# Patient Record
Sex: Male | Born: 1935 | Race: Black or African American | Hispanic: No | Marital: Married | Smoking: Former smoker
Health system: Southern US, Community
[De-identification: ages and names within clinical notes are randomized; demographics above are authoritative.]

## PROBLEM LIST (undated history)

## (undated) DIAGNOSIS — N529 Male erectile dysfunction, unspecified: Secondary | ICD-10-CM

## (undated) DIAGNOSIS — K5900 Constipation, unspecified: Secondary | ICD-10-CM

## (undated) DIAGNOSIS — E7800 Pure hypercholesterolemia, unspecified: Secondary | ICD-10-CM

## (undated) DIAGNOSIS — N400 Enlarged prostate without lower urinary tract symptoms: Secondary | ICD-10-CM

## (undated) DIAGNOSIS — I10 Essential (primary) hypertension: Secondary | ICD-10-CM

## (undated) DIAGNOSIS — K279 Peptic ulcer, site unspecified, unspecified as acute or chronic, without hemorrhage or perforation: Secondary | ICD-10-CM

## (undated) DIAGNOSIS — I4891 Unspecified atrial fibrillation: Secondary | ICD-10-CM

## (undated) DIAGNOSIS — R350 Frequency of micturition: Secondary | ICD-10-CM

## (undated) HISTORY — DX: Male erectile dysfunction, unspecified: N52.9

## (undated) HISTORY — DX: Benign prostatic hyperplasia without lower urinary tract symptoms: N40.0

## (undated) HISTORY — DX: Peptic ulcer, site unspecified, unspecified as acute or chronic, without hemorrhage or perforation: K27.9

## (undated) HISTORY — PX: ANKLE SURGERY: SHX546

## (undated) HISTORY — DX: Constipation, unspecified: K59.00

## (undated) HISTORY — DX: Frequency of micturition: R35.0

---

## 2000-11-30 ENCOUNTER — Emergency Department (HOSPITAL_COMMUNITY): Admission: EM | Admit: 2000-11-30 | Discharge: 2000-11-30 | Payer: Self-pay | Admitting: Emergency Medicine

## 2000-11-30 ENCOUNTER — Encounter: Payer: Self-pay | Admitting: Emergency Medicine

## 2001-04-15 ENCOUNTER — Emergency Department (HOSPITAL_COMMUNITY): Admission: EM | Admit: 2001-04-15 | Discharge: 2001-04-15 | Payer: Self-pay | Admitting: Emergency Medicine

## 2001-04-15 ENCOUNTER — Encounter: Payer: Self-pay | Admitting: Emergency Medicine

## 2001-04-15 ENCOUNTER — Encounter: Payer: Self-pay | Admitting: *Deleted

## 2001-06-13 ENCOUNTER — Ambulatory Visit (HOSPITAL_COMMUNITY): Admission: RE | Admit: 2001-06-13 | Discharge: 2001-06-13 | Payer: Self-pay | Admitting: Specialist

## 2001-07-25 ENCOUNTER — Ambulatory Visit (HOSPITAL_COMMUNITY): Admission: RE | Admit: 2001-07-25 | Discharge: 2001-07-25 | Payer: Self-pay | Admitting: Specialist

## 2002-03-11 ENCOUNTER — Encounter: Payer: Self-pay | Admitting: Emergency Medicine

## 2002-03-11 ENCOUNTER — Emergency Department (HOSPITAL_COMMUNITY): Admission: EM | Admit: 2002-03-11 | Discharge: 2002-03-11 | Payer: Self-pay | Admitting: Emergency Medicine

## 2003-06-29 ENCOUNTER — Emergency Department (HOSPITAL_COMMUNITY): Admission: EM | Admit: 2003-06-29 | Discharge: 2003-06-29 | Payer: Self-pay | Admitting: Emergency Medicine

## 2003-07-02 ENCOUNTER — Emergency Department (HOSPITAL_COMMUNITY): Admission: AD | Admit: 2003-07-02 | Discharge: 2003-07-02 | Payer: Self-pay | Admitting: Family Medicine

## 2003-07-19 ENCOUNTER — Emergency Department (HOSPITAL_COMMUNITY): Admission: AD | Admit: 2003-07-19 | Discharge: 2003-07-19 | Payer: Self-pay | Admitting: Emergency Medicine

## 2003-09-20 ENCOUNTER — Emergency Department (HOSPITAL_COMMUNITY): Admission: EM | Admit: 2003-09-20 | Discharge: 2003-09-20 | Payer: Self-pay | Admitting: Emergency Medicine

## 2004-04-29 ENCOUNTER — Emergency Department (HOSPITAL_COMMUNITY): Admission: EM | Admit: 2004-04-29 | Discharge: 2004-04-29 | Payer: Self-pay | Admitting: Emergency Medicine

## 2004-05-03 ENCOUNTER — Emergency Department (HOSPITAL_COMMUNITY): Admission: EM | Admit: 2004-05-03 | Discharge: 2004-05-03 | Payer: Self-pay | Admitting: Family Medicine

## 2004-07-27 ENCOUNTER — Emergency Department (HOSPITAL_COMMUNITY): Admission: EM | Admit: 2004-07-27 | Discharge: 2004-07-27 | Payer: Self-pay | Admitting: Emergency Medicine

## 2004-07-28 ENCOUNTER — Emergency Department (HOSPITAL_COMMUNITY): Admission: EM | Admit: 2004-07-28 | Discharge: 2004-07-28 | Payer: Self-pay | Admitting: Emergency Medicine

## 2004-08-13 ENCOUNTER — Emergency Department (HOSPITAL_COMMUNITY): Admission: EM | Admit: 2004-08-13 | Discharge: 2004-08-13 | Payer: Self-pay | Admitting: Emergency Medicine

## 2005-01-22 ENCOUNTER — Emergency Department (HOSPITAL_COMMUNITY): Admission: EM | Admit: 2005-01-22 | Discharge: 2005-01-22 | Payer: Self-pay | Admitting: Emergency Medicine

## 2005-11-28 ENCOUNTER — Emergency Department (HOSPITAL_COMMUNITY): Admission: EM | Admit: 2005-11-28 | Discharge: 2005-11-28 | Payer: Self-pay | Admitting: Family Medicine

## 2006-02-17 ENCOUNTER — Emergency Department (HOSPITAL_COMMUNITY): Admission: EM | Admit: 2006-02-17 | Discharge: 2006-02-17 | Payer: Self-pay | Admitting: Emergency Medicine

## 2007-02-22 ENCOUNTER — Emergency Department (HOSPITAL_COMMUNITY): Admission: EM | Admit: 2007-02-22 | Discharge: 2007-02-22 | Payer: Self-pay | Admitting: Emergency Medicine

## 2007-05-25 ENCOUNTER — Emergency Department (HOSPITAL_COMMUNITY): Admission: EM | Admit: 2007-05-25 | Discharge: 2007-05-25 | Payer: Self-pay | Admitting: Family Medicine

## 2008-11-25 ENCOUNTER — Emergency Department (HOSPITAL_COMMUNITY): Admission: EM | Admit: 2008-11-25 | Discharge: 2008-11-25 | Payer: Self-pay | Admitting: Emergency Medicine

## 2010-09-28 ENCOUNTER — Encounter
Admission: RE | Admit: 2010-09-28 | Discharge: 2010-09-28 | Payer: Self-pay | Source: Home / Self Care | Attending: Internal Medicine | Admitting: Internal Medicine

## 2011-07-01 LAB — URINALYSIS, ROUTINE W REFLEX MICROSCOPIC
Bilirubin Urine: NEGATIVE
Glucose, UA: NEGATIVE
Hgb urine dipstick: NEGATIVE
Ketones, ur: NEGATIVE
Leukocytes, UA: NEGATIVE
Nitrite: NEGATIVE
Protein, ur: 100 — AB
Specific Gravity, Urine: 1.012
Urobilinogen, UA: 0.2
pH: 7

## 2011-07-01 LAB — CBC
HCT: 48.4
Hemoglobin: 16
MCHC: 33.1
MCV: 84.4
Platelets: 285
RBC: 5.73
RDW: 13.3
WBC: 12.2 — ABNORMAL HIGH

## 2011-07-01 LAB — POCT I-STAT CREATININE
Creatinine, Ser: 1
Creatinine, Ser: 1
Operator id: 272551
Operator id: 272551

## 2011-07-01 LAB — I-STAT 8, (EC8 V) (CONVERTED LAB)
Acid-Base Excess: 5 — ABNORMAL HIGH
BUN: 9
Bicarbonate: 30.3 — ABNORMAL HIGH
Chloride: 102
Glucose, Bld: 98
HCT: 56 — ABNORMAL HIGH
Hemoglobin: 19 — ABNORMAL HIGH
Operator id: 272551
Potassium: 4.4
Sodium: 136
TCO2: 32
pCO2, Ven: 46.5
pH, Ven: 7.423 — ABNORMAL HIGH

## 2011-07-01 LAB — DIFFERENTIAL
Basophils Absolute: 0
Basophils Relative: 0
Eosinophils Absolute: 0
Eosinophils Relative: 0
Lymphocytes Relative: 17
Lymphs Abs: 2.1
Monocytes Absolute: 0.7
Monocytes Relative: 6
Neutro Abs: 9.3 — ABNORMAL HIGH
Neutrophils Relative %: 76

## 2011-07-01 LAB — POCT CARDIAC MARKERS
CKMB, poc: <1 — ABNORMAL LOW
Myoglobin, poc: 110
Operator id: 272551
Troponin i, poc: <0.05

## 2011-07-01 LAB — URINE MICROSCOPIC-ADD ON

## 2011-11-03 ENCOUNTER — Encounter (HOSPITAL_COMMUNITY): Payer: Self-pay | Admitting: Emergency Medicine

## 2011-11-03 ENCOUNTER — Emergency Department (HOSPITAL_COMMUNITY)
Admission: EM | Admit: 2011-11-03 | Discharge: 2011-11-03 | Disposition: A | Discharge status: Home / Self Care | Payer: Medicare Other | Attending: Emergency Medicine | Admitting: Emergency Medicine

## 2011-11-03 DIAGNOSIS — I10 Essential (primary) hypertension: Secondary | ICD-10-CM | POA: Insufficient documentation

## 2011-11-03 DIAGNOSIS — R109 Unspecified abdominal pain: Secondary | ICD-10-CM | POA: Insufficient documentation

## 2011-11-03 DIAGNOSIS — E7800 Pure hypercholesterolemia, unspecified: Secondary | ICD-10-CM | POA: Insufficient documentation

## 2011-11-03 HISTORY — DX: Essential (primary) hypertension: I10

## 2011-11-03 HISTORY — DX: Pure hypercholesterolemia, unspecified: E78.00

## 2011-11-03 LAB — LIPASE, BLOOD: Lipase: 34 U/L (ref 11–59)

## 2011-11-03 LAB — CBC
HCT: 42.1 % (ref 39.0–52.0)
Hemoglobin: 14.5 g/dL (ref 13.0–17.0)
MCH: 28.5 pg (ref 26.0–34.0)
MCHC: 34.4 g/dL (ref 30.0–36.0)
MCV: 82.9 fL (ref 78.0–100.0)
Platelets: 266 10*3/uL (ref 150–400)
RBC: 5.08 MIL/uL (ref 4.22–5.81)
RDW: 13.1 % (ref 11.5–15.5)
WBC: 9.8 10*3/uL (ref 4.0–10.5)

## 2011-11-03 LAB — URINE MICROSCOPIC-ADD ON

## 2011-11-03 LAB — COMPREHENSIVE METABOLIC PANEL
ALT: 10 U/L (ref 0–53)
AST: 15 U/L (ref 0–37)
Albumin: 3.6 g/dL (ref 3.5–5.2)
Alkaline Phosphatase: 143 U/L — ABNORMAL HIGH (ref 39–117)
BUN: 14 mg/dL (ref 6–23)
CO2: 30 mEq/L (ref 19–32)
Calcium: 10.1 mg/dL (ref 8.4–10.5)
Chloride: 102 mEq/L (ref 96–112)
Creatinine, Ser: 1.16 mg/dL (ref 0.50–1.35)
GFR calc Af Amer: 63 mL/min — ABNORMAL LOW (ref >90)
GFR calc non Af Amer: 55 mL/min — ABNORMAL LOW (ref >90)
Glucose, Bld: 80 mg/dL (ref 70–99)
Potassium: 4 mEq/L (ref 3.5–5.1)
Sodium: 139 mEq/L (ref 135–145)
Total Bilirubin: 0.6 mg/dL (ref 0.3–1.2)
Total Protein: 7.7 g/dL (ref 6.0–8.3)

## 2011-11-03 LAB — DIFFERENTIAL
Basophils Absolute: 0 10*3/uL (ref 0.0–0.1)
Basophils Relative: 0 % (ref 0–1)
Eosinophils Absolute: 0.2 10*3/uL (ref 0.0–0.7)
Eosinophils Relative: 2 % (ref 0–5)
Lymphocytes Relative: 23 % (ref 12–46)
Lymphs Abs: 2.2 10*3/uL (ref 0.7–4.0)
Monocytes Absolute: 0.7 10*3/uL (ref 0.1–1.0)
Monocytes Relative: 7 % (ref 3–12)
Neutro Abs: 6.6 10*3/uL (ref 1.7–7.7)
Neutrophils Relative %: 68 % (ref 43–77)

## 2011-11-03 LAB — URINALYSIS, ROUTINE W REFLEX MICROSCOPIC
Bilirubin Urine: NEGATIVE
Glucose, UA: NEGATIVE mg/dL
Hgb urine dipstick: NEGATIVE
Ketones, ur: NEGATIVE mg/dL
Leukocytes, UA: NEGATIVE
Nitrite: NEGATIVE
Protein, ur: 100 mg/dL — AB
Specific Gravity, Urine: 1.017 (ref 1.005–1.030)
Urobilinogen, UA: 0.2 mg/dL (ref 0.0–1.0)
pH: 5.5 (ref 5.0–8.0)

## 2011-11-03 MED ORDER — PANTOPRAZOLE SODIUM 20 MG PO TBEC: 20.0000 mg | DELAYED_RELEASE_TABLET | Freq: Every day | ORAL | Status: DC

## 2011-11-03 NOTE — ED Notes (Signed)
MD to bedside; patient will be discharged home.

## 2011-11-03 NOTE — ED Provider Notes (Signed)
History     CSN: [id number]  Arrival date & time 11/03/11  1004   First MD Initiated Contact with Patient 11/03/11 1114      Chief Complaint  Patient presents with  . Abdominal Pain    (Consider location/radiation/quality/duration/timing/severity/associated sxs/prior treatment) Patient is a 78 y.o. male presenting with abdominal pain. The history is provided by the patient.  Abdominal Pain The primary symptoms of the illness include abdominal pain.   patient complains of having abdominal pain for "sometime. Pain is made better with eating and made worse with nothing. No fever, vomiting, diarrhea. Was scheduled to see his Dr. today but was unable to keep the appointment. Patient has not taken a GERD type medication. Denies any black or tarry stools. No evaluation for this complaint prior to arrival  Past Medical History  Diagnosis Date  . Hypertension   . Hypercholesterolemia     No past surgical history on file.  No family history on file.  History  Substance Use Topics  . Smoking status: Never Smoker   . Smokeless tobacco: Not on file  . Alcohol Use: No      Review of Systems  Gastrointestinal: Positive for abdominal pain.  All other systems reviewed and are negative.    Allergies  Review of patient's allergies indicates no known allergies.  Home Medications   Current Outpatient Rx  Name Route Sig Dispense Refill  . AMLODIPINE BESYLATE 10 MG PO TABS Oral Take 10 mg by mouth daily.    Marland Kitchen LISINOPRIL 10 MG PO TABS Oral Take 10 mg by mouth daily.    Marland Kitchen PRAVASTATIN SODIUM 40 MG PO TABS Oral Take 40 mg by mouth daily.      BP 156/86  Pulse 73  Temp(Src) 98.4 F (36.9 C) (Oral)  SpO2 95%  Physical Exam  Nursing note and vitals reviewed. Constitutional: He is oriented to person, place, and time. He appears well-developed and well-nourished.  Non-toxic appearance. No distress.  HENT:  Head: Normocephalic and atraumatic.  Eyes: Conjunctivae, EOM and lids are  normal. Pupils are equal, round, and reactive to light.  Neck: Normal range of motion. Neck supple. No tracheal deviation present. No mass present.  Cardiovascular: Normal rate, regular rhythm and normal heart sounds.  Exam reveals no gallop.   No murmur heard. Pulmonary/Chest: Effort normal and breath sounds normal. No stridor. No respiratory distress. He has no decreased breath sounds. He has no wheezes. He has no rhonchi. He has no rales.  Abdominal: Soft. Normal appearance and bowel sounds are normal. He exhibits no distension. There is no tenderness. There is no rigidity, no rebound, no guarding and no CVA tenderness.  Musculoskeletal: Normal range of motion. He exhibits no edema and no tenderness.  Neurological: He is alert and oriented to person, place, and time. He has normal strength. No cranial nerve deficit or sensory deficit. GCS eye subscore is 4. GCS verbal subscore is 5. GCS motor subscore is 6.  Skin: Skin is warm and dry. No abrasion and no rash noted.  Psychiatric: He has a normal mood and affect. His speech is normal and behavior is normal.    ED Course  Procedures (including critical care time)   Labs Reviewed  URINALYSIS, ROUTINE W REFLEX MICROSCOPIC  URINE CULTURE  CBC  DIFFERENTIAL  COMPREHENSIVE METABOLIC PANEL  LIPASE, BLOOD   No results found.  12:45 PM Patient placed in CDU holding for labs, UA.  Anticipate d/c home with PPI, f/u with [facility].  Patient  reports he is pain free at present, is ready to go home.  Pt is A&Ox4, NAD, abdomen soft, nondistended, nontender, no guarding, no rebound.    1:00 PM Patient requests discharge home.  States he is feeling perfectly fine, denies abdominal pain, and his ride is leaving.  Pt to be d/c home with PPI and PCP follow up.   1. Abdominal pain       MDM  Patient to have labs done here a urinalysis checked. Will be transferred to the CDU to have his work up completed plan is for patient to be placed on a PPI and  followup with his PCP        Rise Patience, [place] 11/03/11 1300

## 2011-11-03 NOTE — ED Notes (Addendum)
Pt has been treated by MD for abdominal/gerd type pain for almost two years by his PCP.  Pt states the medicines arent working any longer  bc his stomach hurts after eating.  Denies N/V/D.  The medication he thinks is causing his problem is pravastatin 40 mg.

## 2011-11-03 NOTE — Discharge Instructions (Signed)
Read the information below.  Please follow up with primary care provider this week.  You may return to the ER at any time for worsening condition or any new symptoms that concern you.   Abdominal Pain Abdominal pain can be caused by many things. Your caregiver decides the seriousness of your pain by an examination and possibly blood tests and X-rays. Many cases can be observed and treated at home. Most abdominal pain is not caused by a disease and will probably improve without treatment. However, in many cases, more time must pass before a clear cause of the pain can be found. Before that point, it may not be known if you need more testing, or if hospitalization or surgery is needed. HOME CARE INSTRUCTIONS   Do not take laxatives unless directed by your caregiver.   Take pain medicine only as directed by your caregiver.   Only take over-the-counter or prescription medicines for pain, discomfort, or fever as directed by your caregiver.   Try a clear liquid diet (broth, tea, or water) for as long as directed by your caregiver. Slowly move to a bland diet as tolerated.  SEEK IMMEDIATE MEDICAL CARE IF:   The pain does not go away.   You have a fever.   You keep throwing up (vomiting).   The pain is felt only in portions of the abdomen. Pain in the right side could possibly be appendicitis. In an adult, pain in the left lower portion of the abdomen could be colitis or diverticulitis.   You pass bloody or black tarry stools.  MAKE SURE YOU:   Understand these instructions.   Will watch your condition.   Will get help right away if you are not doing well or get worse.  Document Released: 06/15/2005 Document Revised: 05/18/2011 Document Reviewed: 04/23/2008 [facility] Patient Information 2012 [place], [place].

## 2011-11-03 NOTE — ED Notes (Signed)
Pt c/o reflux type abd pain that is improved with eating after taking pravastatin x 2 years; pt here for eval today

## 2011-11-04 LAB — URINE CULTURE
Colony Count: NO GROWTH
Culture  Setup Time: 201302141220
Culture: NO GROWTH

## 2011-11-04 NOTE — ED Provider Notes (Signed)
Medical screening examination/treatment/procedure(s) were performed by non-physician practitioner and as supervising physician I was immediately available for consultation/collaboration.  Toy Baker, MD 11/04/11 [phone]

## 2012-05-21 ENCOUNTER — Emergency Department (HOSPITAL_COMMUNITY): Payer: Medicare Other

## 2012-05-21 ENCOUNTER — Encounter (HOSPITAL_COMMUNITY): Payer: Self-pay | Admitting: *Deleted

## 2012-05-21 ENCOUNTER — Emergency Department (HOSPITAL_COMMUNITY)
Admission: EM | Admit: 2012-05-21 | Discharge: 2012-05-21 | Disposition: A | Discharge status: Home Health | Payer: Medicare Other | Attending: Emergency Medicine | Admitting: Emergency Medicine

## 2012-05-21 DIAGNOSIS — E7800 Pure hypercholesterolemia, unspecified: Secondary | ICD-10-CM | POA: Insufficient documentation

## 2012-05-21 DIAGNOSIS — Z79899 Other long term (current) drug therapy: Secondary | ICD-10-CM | POA: Insufficient documentation

## 2012-05-21 DIAGNOSIS — Z23 Encounter for immunization: Secondary | ICD-10-CM | POA: Insufficient documentation

## 2012-05-21 DIAGNOSIS — S0990XA Unspecified injury of head, initial encounter: Secondary | ICD-10-CM | POA: Insufficient documentation

## 2012-05-21 DIAGNOSIS — I10 Essential (primary) hypertension: Secondary | ICD-10-CM | POA: Insufficient documentation

## 2012-05-21 DIAGNOSIS — S0003XA Contusion of scalp, initial encounter: Secondary | ICD-10-CM | POA: Insufficient documentation

## 2012-05-21 DIAGNOSIS — W19XXXA Unspecified fall, initial encounter: Secondary | ICD-10-CM

## 2012-05-21 DIAGNOSIS — W1811XA Fall from or off toilet without subsequent striking against object, initial encounter: Secondary | ICD-10-CM | POA: Insufficient documentation

## 2012-05-21 DIAGNOSIS — S00209A Unspecified superficial injury of unspecified eyelid and periocular area, initial encounter: Secondary | ICD-10-CM | POA: Insufficient documentation

## 2012-05-21 MED ORDER — TETANUS-DIPHTH-ACELL PERTUSSIS 5-2.5-18.5 LF-MCG/0.5 IM SUSP
0.5000 mL | Freq: Once | INTRAMUSCULAR | Status: AC
  Administered 2012-05-21: 0.5 mL via INTRAMUSCULAR
  Filled 2012-05-21: qty 0.5

## 2012-05-21 NOTE — ED Provider Notes (Signed)
History   This chart was scribed for Doug Sou, MD by Melba Coon. The patient was seen in room TR08C/TR08C and the patient's care was started at 12:06PM.    CSN: [id number]  Arrival date & time 05/21/12  1137   None     Chief Complaint  Patient presents with  . Fall    (Consider location/radiation/quality/duration/timing/severity/associated sxs/prior treatment) HPI Luis Norton is a 78 y.o. male who presents to the Emergency Department complaining of a fall with head contact but no LOC with an onset this morning. Pt states that he went to sleep on the commode around 2-3AM and fell off. Pt states that he c/o pain at the time of the fall but did not call EMS. Pt does not c/o pain at time of exam. Pt can ambulate after the fall and is normal compared to baseline. Tetanus shot not up to date. No known allergies. No other pertinent medical symptoms.  Past Medical History  Diagnosis Date  . Hypertension   . Hypercholesterolemia   Arthritis  History reviewed. No pertinent past surgical history.  History reviewed. No pertinent family history.  History  Substance Use Topics  . Smoking status: Never Smoker   . Smokeless tobacco: Not on file  . Alcohol Use: No      Review of Systems  Constitutional: Negative for fever.       10 Systems reviewed and are negative for acute change except as noted in the HPI.  HENT: Negative for congestion.   Eyes: Negative for discharge and redness.  Respiratory: Negative for cough and shortness of breath.   Cardiovascular: Negative for chest pain.  Gastrointestinal: Negative for vomiting and abdominal pain.  Musculoskeletal: Negative for back pain.  Skin: Negative for rash.  Neurological: Negative for syncope, numbness and headaches.  Psychiatric/Behavioral:       No behavior change.     Allergies  Review of patient's allergies indicates no known allergies.  Home Medications   Current Outpatient Rx  Name Route Sig Dispense  Refill  . AMLODIPINE BESYLATE 10 MG PO TABS Oral Take 10 mg by mouth daily.    Marland Kitchen LISINOPRIL 10 MG PO TABS Oral Take 10 mg by mouth daily.    Marland Kitchen PRAVASTATIN SODIUM 40 MG PO TABS Oral Take 40 mg by mouth daily.      BP 153/68  Pulse 74  Temp 97.5 F (36.4 C) (Oral)  Resp 18  SpO2 94%  Physical Exam  Nursing note and vitals reviewed. Constitutional: He is oriented to person, place, and time. He appears well-developed and well-nourished. No distress.  HENT:  Head: Normocephalic and atraumatic.       Hematoma left for head with overlying abrasion. Dime sized abrasion immediately lateral to left eye  Eyes: EOM are normal.  Neck: Neck supple. No tracheal deviation present.  Cardiovascular: Normal rate.   Pulmonary/Chest: Effort normal. No respiratory distress.  Musculoskeletal: Normal range of motion.  Neurological: He is alert and oriented to person, place, and time.       Gait normal.  Skin: Skin is warm and dry.       Hematoma and abrasion to the forehead 5 cm in diameter. Abrasion lateral to the left eye.  Psychiatric: He has a normal mood and affect. His behavior is normal.    ED Course  Procedures (including critical care time)  DIAGNOSTIC STUDIES: Oxygen Saturation is 94% on room air, adequate by my interpretation.    COORDINATION OF CARE:  12:10PM - Head  CT and tetanus shot will be ordered for the pt. Pt declines pain meds. 12:51PM - recheck; head CT was negative; pt will be d/c. Pt declines pain meds for home.   Labs Reviewed - No data to display Ct Head Wo Contrast  05/21/2012  *RADIOLOGY REPORT*  Clinical Data: Blunt trauma post fall.  CT HEAD WITHOUT CONTRAST  Technique:  Contiguous axial images were obtained from the base of the skull through the vertex without contrast.  Comparison: None.  Findings: There is a left frontal scalp soft tissue swelling. Atherosclerotic and physiologic intracranial calcifications. Diffuse parenchymal atrophy. Bilateral basal gangliar   lacunar infarcts.  Patchy areas of hypoattenuation in deep and periventricular white matter bilaterally. Negative for acute intracranial hemorrhage, mass lesion, acute infarction, midline shift, or mass-effect. Acute infarct may be inapparent on noncontrast CT. Ventricles and sulci symmetric. Bone windows demonstrate no focal lesion.  IMPRESSION:  1. Negative for bleed or other acute intracranial process.  2. Atrophy and nonspecific white matter changes. 3.  Bilateral lacunar infarcts in basal ganglia.   Original Report Authenticated By: Osa Craver, M.D.      No diagnosis found.   1:45 PM alert Glasgow Coma Score 15 no distress MDM  planylenol for pain head injury instructions  Diagnosis #1 fall  #2 minor closed head trauma   I personally performed the services described in this documentation, which was scribed in my presence. The recorded information has been reviewed and considered.    Doug Sou, MD 05/21/12 1257

## 2012-05-21 NOTE — ED Notes (Signed)
To ED for eval after 'falling asleep on the toilet this morning and hitting head'. States this was at 0230 today. Pt alert and oriented. With hematoma to forehead. Denies neck or head pain. Denies feeling dizzy.

## 2013-03-10 ENCOUNTER — Encounter (HOSPITAL_COMMUNITY): Payer: Self-pay | Admitting: Emergency Medicine

## 2013-03-10 ENCOUNTER — Emergency Department (HOSPITAL_COMMUNITY)
Admission: EM | Admit: 2013-03-10 | Discharge: 2013-03-10 | Disposition: A | Discharge status: Home / Self Care | Payer: Medicare Other | Attending: Emergency Medicine | Admitting: Emergency Medicine

## 2013-03-10 ENCOUNTER — Emergency Department (HOSPITAL_COMMUNITY): Payer: Medicare Other

## 2013-03-10 DIAGNOSIS — M1990 Unspecified osteoarthritis, unspecified site: Secondary | ICD-10-CM | POA: Insufficient documentation

## 2013-03-10 DIAGNOSIS — I10 Essential (primary) hypertension: Secondary | ICD-10-CM | POA: Insufficient documentation

## 2013-03-10 DIAGNOSIS — K5641 Fecal impaction: Secondary | ICD-10-CM | POA: Insufficient documentation

## 2013-03-10 DIAGNOSIS — E7800 Pure hypercholesterolemia, unspecified: Secondary | ICD-10-CM | POA: Insufficient documentation

## 2013-03-10 DIAGNOSIS — R1084 Generalized abdominal pain: Secondary | ICD-10-CM | POA: Insufficient documentation

## 2013-03-10 DIAGNOSIS — M79609 Pain in unspecified limb: Secondary | ICD-10-CM | POA: Insufficient documentation

## 2013-03-10 DIAGNOSIS — Z79899 Other long term (current) drug therapy: Secondary | ICD-10-CM | POA: Insufficient documentation

## 2013-03-10 LAB — URINALYSIS, ROUTINE W REFLEX MICROSCOPIC
Leukocytes, UA: NEGATIVE
Nitrite: NEGATIVE
Specific Gravity, Urine: 1.016 (ref 1.005–1.030)
Urobilinogen, UA: 0.2 mg/dL (ref 0.0–1.0)
pH: 6 (ref 5.0–8.0)

## 2013-03-10 LAB — URINE MICROSCOPIC-ADD ON

## 2013-03-10 MED ORDER — POLYETHYLENE GLYCOL 3350 17 GM/SCOOP PO POWD: ORAL | Status: DC

## 2013-03-10 MED ORDER — HYDROCODONE-ACETAMINOPHEN 5-325 MG PO TABS
1.0000 | ORAL_TABLET | Freq: Once | ORAL | Status: AC
  Administered 2013-03-10: 1 via ORAL
  Filled 2013-03-10: qty 1

## 2013-03-10 MED ORDER — MINERAL OIL RE ENEM: 1.0000 | ENEMA | Freq: Once | RECTAL | Status: DC

## 2013-03-10 NOTE — ED Notes (Addendum)
Pt c/o constipation x 4 days, states it is from miralax. Pt states his arms are also hurting, pt states he had a shot in each arm 2 mo ago and right arm has been hurting since then and left arm just started hurting. States that the nurse used the same needle, pt son states they did not. No redness noted, both arms are painful to palpation. NAD noted. Pt able to move BLE with NAD. No swelling or redness noted to BLE.

## 2013-03-10 NOTE — ED Provider Notes (Signed)
History     CSN: [id number]  Arrival date & time 03/10/13  1346   None     Chief Complaint  Patient presents with  . Constipation  . Arm Pain    B/L arms    (Consider location/radiation/quality/duration/timing/severity/associated sxs/prior treatment) Patient is a 79 y.o. male presenting with general illness and shoulder pain.  Illness Quality:  Constipation Severity:  Moderate Onset quality:  Gradual Duration:  4 days Timing:  Constant Progression:  Worsening Chronicity:  New Relieved by:  Nothing Worsened by:  Nothing Ineffective treatments:  Miralax 1 capful BID Associated symptoms: abdominal pain (mild, generalized)   Associated symptoms: no chest pain, no congestion, no cough, no diarrhea, no fever, no headaches, no nausea, no rash, no rhinorrhea, no shortness of breath, no sore throat and no vomiting   Shoulder Pain This is a new problem. Episode onset: 1 month ago. The problem occurs constantly. The problem has been gradually worsening. Associated symptoms include abdominal pain (mild, generalized). Pertinent negatives include no arthralgias, chest pain, chills, congestion, coughing, fever, headaches, nausea, rash, sore throat or vomiting. The symptoms are aggravated by twisting and bending. He has tried nothing for the symptoms.    Past Medical History  Diagnosis Date  . Hypertension   . Hypercholesterolemia     Past Surgical History  Procedure Laterality Date  . Ankle surgery Right     No family history on file.  History  Substance Use Topics  . Smoking status: Never Smoker   . Smokeless tobacco: Not on file  . Alcohol Use: No      Review of Systems  Constitutional: Negative for fever and chills.  HENT: Negative for congestion, sore throat and rhinorrhea.   Eyes: Negative for photophobia and visual disturbance.  Respiratory: Negative for cough and shortness of breath.   Cardiovascular: Negative for chest pain and leg swelling.  Gastrointestinal:  Positive for abdominal pain (mild, generalized). Negative for nausea, vomiting, diarrhea and constipation.  Endocrine: Negative for polydipsia and polyuria.  Genitourinary: Negative for dysuria and hematuria.  Musculoskeletal: Negative for back pain and arthralgias.  Skin: Negative for color change and rash.  Neurological: Negative for dizziness, syncope, light-headedness and headaches.  Hematological: Negative for adenopathy. Does not bruise/bleed easily.  All other systems reviewed and are negative.    Allergies  Review of patient's allergies indicates no known allergies.  Home Medications   Current Outpatient Rx  Name  Route  Sig  Dispense  Refill  . amLODipine (NORVASC) 10 MG tablet   Oral   Take 10 mg by mouth daily.         Marland Kitchen lisinopril (PRINIVIL,ZESTRIL) 10 MG tablet   Oral   Take 10 mg by mouth daily.         . pravastatin (PRAVACHOL) 40 MG tablet   Oral   Take 40 mg by mouth daily.         . mineral oil (FLEET MINERAL OIL) enema   Rectal   Place 1 enema rectally once.   133 mL   0   . polyethylene glycol powder (MIRALAX) powder      Take 1 capful (17g) by mouth TID until symptoms resolve.  In addition, tomorrow take 7 capfuls in 32 oz fluid over 1 hour.   255 g   0     BP 184/86  Pulse 53  Temp(Src) 98.1 F (36.7 C) (Oral)  Resp 18  SpO2 99%  Physical Exam  Vitals reviewed. Constitutional: He is oriented  to person, place, and time. He appears well-developed and well-nourished.  HENT:  Head: Normocephalic and atraumatic.  Eyes: Conjunctivae and EOM are normal.  Neck: Normal range of motion. Neck supple.  Cardiovascular: Normal rate, regular rhythm and normal heart sounds.   Pulmonary/Chest: Effort normal and breath sounds normal. No respiratory distress. He exhibits tenderness (in shoulders).  Abdominal: He exhibits no distension. There is no tenderness. There is no rebound and no guarding.  Musculoskeletal: Normal range of motion.        Right shoulder: He exhibits tenderness, bony tenderness and crepitus. He exhibits normal range of motion and no swelling.       Left shoulder: He exhibits tenderness, bony tenderness and crepitus. He exhibits normal range of motion and no effusion.  Neurological: He is alert and oriented to person, place, and time.  Skin: Skin is warm and dry.    ED Course  Procedures (including critical care time)  Labs Reviewed  URINALYSIS, ROUTINE W REFLEX MICROSCOPIC - Abnormal; Notable for the following:    Ketones, ur 15 (*)    Protein, ur 100 (*)    All other components within normal limits  URINE CULTURE  URINE MICROSCOPIC-ADD ON   Results for orders placed during the hospital encounter of 03/10/13  URINALYSIS, ROUTINE W REFLEX MICROSCOPIC      Result Value Range   Color, Urine YELLOW  YELLOW   APPearance CLEAR  CLEAR   Specific Gravity, Urine 1.016  1.005 - 1.030   pH 6.0  5.0 - 8.0   Glucose, UA NEGATIVE  NEGATIVE mg/dL   Hgb urine dipstick NEGATIVE  NEGATIVE   Bilirubin Urine NEGATIVE  NEGATIVE   Ketones, ur 15 (*) NEGATIVE mg/dL   Protein, ur 010 (*) NEGATIVE mg/dL   Urobilinogen, UA 0.2  0.0 - 1.0 mg/dL   Nitrite NEGATIVE  NEGATIVE   Leukocytes, UA NEGATIVE  NEGATIVE  URINE MICROSCOPIC-ADD ON      Result Value Range   Squamous Epithelial / LPF RARE  RARE   WBC, UA 0-2  <3 WBC/hpf   RBC / HPF 0-2  <3 RBC/hpf   Bacteria, UA RARE  RARE    Dg Abd Acute W/chest  03/10/2013   *RADIOLOGY REPORT*  Clinical Data: Constipation. Bilateral arm pain.  ACUTE ABDOMEN SERIES (ABDOMEN 2 VIEW & CHEST 1 VIEW)  Comparison: Chest x-ray dated 05/25/2007 and abdominal radiograph dated 11/22/2011  Findings: Heart size and vascularity are normal and the lungs are clear of infiltrates and effusions.  There are calcifications in the right midzone which is stable.  Chronic severe degenerative changes of both shoulders.  No free air or free fluid in the abdomen.  Air scattered throughout nondistended loops of  large and small bowel.  Multiple phleboliths in the pelvis.  Rotoscoliosis of the lumbar spine.  IMPRESSION: No acute abnormalities of the abdomen or chest.   Original Report Authenticated By: Francene Boyers, M.D.     1. Fecal impaction in rectum   2. Osteoarthritis      Date: 03/11/2013  Rate: 64  Rhythm: normal sinus rhythm  QRS Axis: normal  Intervals: normal  ST/T Wave abnormalities: normal  Conduction Disutrbances:none  Narrative Interpretation: NSR  Old EKG Reviewed: none available    MDM   79 y.o. male  with pertinent PMH of HTN, hypercholesterolemia presents with constipation x 4 days, no ho same.  Pt was in normal state of health prior to 4 days ago, started to have hardened BM, however has not  had a BM since that time.  He denies melena, hematochezia, nausea, vomiting, fever, however on ros does endorse bil shoulder pain for over 1 month.  No chest pain or dyspnea.  Physical exam as above demonstrated fecal impaction, bil crepitus and tenderness of shoulders.  Shoulder consistent with OA.  Rectum disimpacted partially, however pt elected to stop and attempt enema with return if symptoms do not improve with enema and miralax.  Likely benign impaction, CXR, KUB, labwork unremarkable.  EKG as above.  Doubt MI, AAA, SBO, or other emergent pathology.  Given strict return precautions for changing or worsening symptoms.    Labs and imaging as above reviewed by myself and attending,Dr. Ranae Palms, with whom case was discussed.   1. Fecal impaction in rectum   2. Osteoarthritis             Noel Gerold, MD 03/11/13 [phone]

## 2013-03-10 NOTE — ED Notes (Signed)
Doctor in to see

## 2013-03-10 NOTE — ED Notes (Signed)
Pt c/o constipation x 2 weeks. Pt also reports B/L arm pain x 2 months since getting shots in his arms. Pt reports that he has been taking miralax but no relief. Pt reports last BM 4 days ago and was hard. Last normal BM 2 months ago.

## 2013-03-12 NOTE — ED Provider Notes (Signed)
I saw and evaluated the patient, reviewed the resident's note and I agree with the findings and plan. abd soft, no focal tenderness. Crepitance in bl shoulders. No acute injury.   Loren Racer, MD 03/12/13 [phone]

## 2013-03-18 ENCOUNTER — Emergency Department (INDEPENDENT_AMBULATORY_CARE_PROVIDER_SITE_OTHER)
Admission: EM | Admit: 2013-03-18 | Discharge: 2013-03-18 | Disposition: A | Discharge status: Home / Self Care | Payer: Medicare Other | Source: Home / Self Care

## 2013-03-18 ENCOUNTER — Emergency Department (INDEPENDENT_AMBULATORY_CARE_PROVIDER_SITE_OTHER): Payer: Medicare Other

## 2013-03-18 ENCOUNTER — Encounter (HOSPITAL_COMMUNITY): Payer: Self-pay | Admitting: Emergency Medicine

## 2013-03-18 DIAGNOSIS — S20219A Contusion of unspecified front wall of thorax, initial encounter: Secondary | ICD-10-CM

## 2013-03-18 DIAGNOSIS — S20212A Contusion of left front wall of thorax, initial encounter: Secondary | ICD-10-CM

## 2013-03-18 LAB — URINE CULTURE

## 2013-03-18 NOTE — ED Notes (Signed)
Placed a yellow fall risk arm band on pt.

## 2013-03-18 NOTE — ED Provider Notes (Signed)
History    CSN: [id number] Arrival date & time 03/18/13  1834  None    Chief Complaint  Patient presents with  . Flank Pain   (Consider location/radiation/quality/duration/timing/severity/associated sxs/prior Treatment) HPI  79 yo bm presents today with left flank pain.  Patient states that last night he fell asleep in his chair and fell onto the floor landing on his left side.  C/o left flank pain.  Denies sob, or urinary issues.  While in clinic today, pt got off the examining table and fell onto floor.  Was helped up and denied any injury or areas pain other than left flank.   Past Medical History  Diagnosis Date  . Hypertension   . Hypercholesterolemia    Past Surgical History  Procedure Laterality Date  . Ankle surgery Right    History reviewed. No pertinent family history. History  Substance Use Topics  . Smoking status: Never Smoker   . Smokeless tobacco: Not on file  . Alcohol Use: No    Review of Systems  Constitutional: Negative.   HENT: Negative.   Eyes: Negative.   Respiratory: Negative.   Cardiovascular: Negative.   Gastrointestinal: Negative.   Endocrine: Negative.   Genitourinary: Negative.   Musculoskeletal: Positive for back pain (left side).  Skin: Negative.   Psychiatric/Behavioral: Negative.     Allergies  Review of patient's allergies indicates no known allergies.  Home Medications   Current Outpatient Rx  Name  Route  Sig  Dispense  Refill  . amLODipine (NORVASC) 10 MG tablet   Oral   Take 10 mg by mouth daily.         Marland Kitchen lisinopril (PRINIVIL,ZESTRIL) 10 MG tablet   Oral   Take 10 mg by mouth daily.         . mineral oil (FLEET MINERAL OIL) enema   Rectal   Place 1 enema rectally once.   133 mL   0   . polyethylene glycol powder (MIRALAX) powder      Take 1 capful (17g) by mouth TID until symptoms resolve.  In addition, tomorrow take 7 capfuls in 32 oz fluid over 1 hour.   255 g   0   . pravastatin (PRAVACHOL) 40 MG  tablet   Oral   Take 40 mg by mouth daily.          BP 148/66  Pulse 97  Temp(Src) 97.9 F (36.6 C) (Oral)  Resp 20  SpO2 96% Physical Exam  Constitutional: He is oriented to person, place, and time. He appears well-developed.  HENT:  Head: Normocephalic and atraumatic.  Eyes: EOM are normal. Pupils are equal, round, and reactive to light.  Neck: Normal range of motion.  Cardiovascular: Normal rate and regular rhythm.   Pulmonary/Chest: Effort normal and breath sounds normal.  Abdominal: Soft. Bowel sounds are normal.  Musculoskeletal:  Left back is mod ttp.  No bruising or deformity.    Neurological: He is alert and oriented to person, place, and time.  Skin: Skin is warm and dry.  Psychiatric: He has a normal mood and affect.    ED Course  Procedures (including critical care time) Labs Reviewed - No data to display Dg Ribs Unilateral W/chest Left  03/18/2013   *RADIOLOGY REPORT*  Clinical Data: Status post fall.  Left chest pain.  LEFT RIBS AND CHEST - 3+ VIEW  Comparison: Single view of the chest 03/10/2013.  Findings: A calcified granulomata are again seen in the right mid lung zone.  The lungs appear emphysematous but otherwise unremarkable.  Heart size is normal.  No pneumothorax or pleural fluid.  There is a remote appearing fracture of the lateral arc of the left eleventh rib.  IMPRESSION:  No acute finding.  Remote appearing left eleventh rib fracture is noted.   Original Report Authenticated By: Holley Dexter, M.D.   1. Rib contusion, left, initial encounter     MDM  Patient can use tylenol prn pain.  Advise daughter and patient that I don't want to give him a narcotic since he has had some chronic balance issues.  F/u up in 5-7 days if symptoms worsen.   Zonia Kief, PA-C 03/18/13 2135

## 2013-03-18 NOTE — ED Notes (Signed)
Patient was sleep and he fell out of the chair while he was at home.   While in the office patient fall again

## 2013-03-18 NOTE — ED Provider Notes (Signed)
Medical screening examination/treatment/procedure(s) were performed by non-physician practitioner and as supervising physician I was immediately available for consultation/collaboration.  Leslee Home, M.D.  Reuben Likes, MD 03/18/13 2150

## 2013-03-18 NOTE — ED Notes (Signed)
I was in exam room with pt and was taking him for xrays of his ribs on left side.  Pt got off of exam table and when I turned around and started walking out of exam room to lead pt to xray room he fell.  Pt helped off of the floor by myself, Betha Loa EMT-P and [place] Shannon CMA.  Pt states that nothing else was injured during the fall.  MD notified.

## 2013-07-03 ENCOUNTER — Encounter (HOSPITAL_COMMUNITY): Payer: Self-pay | Admitting: Emergency Medicine

## 2013-07-03 ENCOUNTER — Emergency Department (HOSPITAL_COMMUNITY)
Admission: EM | Admit: 2013-07-03 | Discharge: 2013-07-03 | Disposition: A | Discharge status: Home / Self Care | Payer: Medicare Other | Attending: Emergency Medicine | Admitting: Emergency Medicine

## 2013-07-03 ENCOUNTER — Emergency Department (HOSPITAL_COMMUNITY): Payer: Medicare Other

## 2013-07-03 DIAGNOSIS — I10 Essential (primary) hypertension: Secondary | ICD-10-CM | POA: Insufficient documentation

## 2013-07-03 DIAGNOSIS — K5900 Constipation, unspecified: Secondary | ICD-10-CM

## 2013-07-03 DIAGNOSIS — Z79899 Other long term (current) drug therapy: Secondary | ICD-10-CM | POA: Insufficient documentation

## 2013-07-03 DIAGNOSIS — R1032 Left lower quadrant pain: Secondary | ICD-10-CM | POA: Insufficient documentation

## 2013-07-03 DIAGNOSIS — E7800 Pure hypercholesterolemia, unspecified: Secondary | ICD-10-CM | POA: Insufficient documentation

## 2013-07-03 LAB — CBC WITH DIFFERENTIAL/PLATELET
Basophils Absolute: 0 10*3/uL (ref 0.0–0.1)
Basophils Relative: 0 % (ref 0–1)
HCT: 42.7 % (ref 39.0–52.0)
Hemoglobin: 14.5 g/dL (ref 13.0–17.0)
Lymphocytes Relative: 20 % (ref 12–46)
MCHC: 34 g/dL (ref 30.0–36.0)
Monocytes Absolute: 0.8 10*3/uL (ref 0.1–1.0)
Neutro Abs: 6.7 10*3/uL (ref 1.7–7.7)
Neutrophils Relative %: 71 % (ref 43–77)
RDW: 13 % (ref 11.5–15.5)
WBC: 9.5 10*3/uL (ref 4.0–10.5)

## 2013-07-03 LAB — COMPREHENSIVE METABOLIC PANEL
ALT: 10 U/L (ref 0–53)
AST: 17 U/L (ref 0–37)
Albumin: 3.6 g/dL (ref 3.5–5.2)
Alkaline Phosphatase: 136 U/L — ABNORMAL HIGH (ref 39–117)
CO2: 27 mEq/L (ref 19–32)
Chloride: 98 mEq/L (ref 96–112)
Creatinine, Ser: 1.21 mg/dL (ref 0.50–1.35)
GFR calc non Af Amer: 51 mL/min — ABNORMAL LOW (ref >90)
Potassium: 4 mEq/L (ref 3.5–5.1)
Total Bilirubin: 0.5 mg/dL (ref 0.3–1.2)

## 2013-07-03 MED ORDER — FLEET NATURALS CLEANSING ENEMA RE ENEM: 1.0000 | ENEMA | Freq: Once | RECTAL | Status: DC | PRN

## 2013-07-03 MED ORDER — POLYETHYLENE GLYCOL 3350 17 GM/SCOOP PO POWD: 17.0000 g | Freq: Every day | ORAL | Status: DC

## 2013-07-03 MED ORDER — SODIUM CHLORIDE 0.9 % IV BOLUS (SEPSIS)
1000.0000 mL | Freq: Once | INTRAVENOUS | Status: AC
  Administered 2013-07-03: 1000 mL via INTRAVENOUS

## 2013-07-03 NOTE — ED Notes (Signed)
Pt up to use restroom. States he passed a lot of gas but could not have BM

## 2013-07-03 NOTE — ED Notes (Signed)
Pt. Trying to get out of bed to use restroom. Helped patient back to bed and gave patient urinal. Pt. Verbalized understanding stating "i'll stay in bed".

## 2013-07-03 NOTE — ED Notes (Signed)
MD at bedside. 

## 2013-07-03 NOTE — ED Provider Notes (Signed)
CSN: [id number]     Arrival date & time 07/03/13  1639 History   First MD Initiated Contact with Patient 07/03/13 1833     Chief Complaint  Patient presents with  . Abdominal Pain  . Constipation   (Consider location/radiation/quality/duration/timing/severity/associated sxs/prior Treatment) Patient is a 79 y.o. male presenting with constipation. The history is provided by the patient.  Constipation Severity:  Moderate Time since last bowel movement:  4 days Timing:  Constant Progression:  Worsening Chronicity:  Recurrent Stool description:  None produced Unusual stool frequency:  None in 4 days Relieved by:  None tried Worsened by:  Nothing tried Ineffective treatments:  None tried Associated symptoms: abdominal pain   Associated symptoms: no anorexia, no diarrhea, no dysuria, no fever, no nausea, no urinary retention and no vomiting     Past Medical History  Diagnosis Date  . Hypertension   . Hypercholesterolemia    Past Surgical History  Procedure Laterality Date  . Ankle surgery Right    History reviewed. No pertinent family history. History  Substance Use Topics  . Smoking status: Never Smoker   . Smokeless tobacco: Not on file  . Alcohol Use: No    Review of Systems  Constitutional: Negative for fever, activity change and appetite change.  Gastrointestinal: Positive for abdominal pain and constipation. Negative for nausea, vomiting, diarrhea, blood in stool, abdominal distention and anorexia.  Genitourinary: Negative for dysuria and difficulty urinating.  Musculoskeletal: Negative for arthralgias and myalgias.  Skin: Negative for color change, pallor, rash and wound.  Neurological: Negative for dizziness, seizures, weakness, light-headedness and numbness.  All other systems reviewed and are negative.    Allergies  Review of patient's allergies indicates no known allergies.  Home Medications   Current Outpatient Rx  Name  Route  Sig  Dispense  Refill   . amLODipine (NORVASC) 10 MG tablet   Oral   Take 10 mg by mouth daily.         Marland Kitchen lisinopril (PRINIVIL,ZESTRIL) 10 MG tablet   Oral   Take 10 mg by mouth daily.         . pravastatin (PRAVACHOL) 40 MG tablet   Oral   Take 40 mg by mouth daily.         . polyethylene glycol powder (MIRALAX) powder   Oral   Take 17 g by mouth daily.   255 g   0   . Rectal Cleansers (FLEET [employer]UR[employer] CLEANSING ENEMA) ENEM   Rectal   Place 1 enema rectally once as needed.   1 enema   0    BP 158/72  Pulse 76  Temp(Src) 97.7 F (36.5 C) (Oral)  Resp 24  SpO2 99% Physical Exam  Nursing note and vitals reviewed. Constitutional: He is oriented to person, place, and time. He appears well-developed and well-nourished. No distress.  Remarkably well-appearing elderly male in no apparent distress  HENT:  Head: Normocephalic and atraumatic.  Mouth/Throat: Oropharynx is clear and moist. No oropharyngeal exudate.  Eyes: Conjunctivae and EOM are normal. Pupils are equal, round, and reactive to light.  Neck: Normal range of motion. Neck supple.  Cardiovascular: Normal rate, regular rhythm, normal heart sounds and intact distal pulses.  Exam reveals no gallop and no friction rub.   No murmur heard. Pulmonary/Chest: Effort normal and breath sounds normal. No respiratory distress. He has no wheezes. He has no rales.  Abdominal: Soft. He exhibits no distension and no mass. There is tenderness (mild tenderness in the left lower  quadrant.). There is no rebound and no guarding.  Genitourinary:  Rectal exam with firm stool, out of reach for digital disimpaction  Musculoskeletal: Normal range of motion. He exhibits no edema and no tenderness.  Lymphadenopathy:    He has no cervical adenopathy.  Neurological: He is alert and oriented to person, place, and time. No cranial nerve deficit.  Skin: Skin is warm and dry. No rash noted. He is not diaphoretic.  Psychiatric: He has a normal mood and affect. His  behavior is normal. Judgment and thought content normal.    ED Course  Procedures (including critical care time) Labs Review Labs Reviewed  COMPREHENSIVE METABOLIC PANEL - Abnormal; Notable for the following:    Glucose, Bld 106 (*)    Alkaline Phosphatase 136 (*)    GFR calc non Af Amer 51 (*)    GFR calc Af Amer 59 (*)    All other components within normal limits  CBC WITH DIFFERENTIAL  POCT I-STAT TROPONIN I   Imaging Review Dg Abd 1 View  07/03/2013   CLINICAL DATA:  Abdominal pain and constipation.  EXAM: ABDOMEN - 1 VIEW  COMPARISON:  03/10/2013  FINDINGS: Moderate volume of formed stool including in the rectum. The ascending colon and hepatic flexure are gas distended, without evidence of obstruction. Maximal luminal diameter measures 8 cm. No gas dilated small bowel. No suspicious intra-abdominal mass effect or calcification. The lung bases clear.  Degenerative lumbar dextroscoliosis.  IMPRESSION: 1. Moderate stool volume. There may be developing rectal impaction based on the history. 2. Moderate ascending colonic distention without evidence of mechanical obstruction.   Electronically Signed   By: Tiburcio Pea M.D.   On: 07/03/2013 21:25    EKG Interpretation   None       Date: 07/04/2013  Rate: 113  Rhythm: atrial fibrillation  QRS Axis: normal  Intervals: normal  ST/T Wave abnormalities: nonspecific T wave changes  Conduction Disutrbances:none  Narrative Interpretation:   Old EKG Reviewed: none available    MDM   1. Constipation     79 year old male with a history of constipation presents with abdominal pain and no bowel movement in 4 days. Patient states that he occasionally has to take pills in order to make himself about. He usually does not go longer than 2 days without a bowel movement, however today it is an approximate four days. Abdominal pain started yesterday and is mostly left lower quadrant, cramping and aching in nature. He also feels distended. He  denies fevers. He also denies nausea, vomiting.  On exam, the patient's abdomen is soft, not peritoneal. He is nontoxic and well-appearing. Rectal exam with firm stool noted in the vault but not fully able to be reached or disimpacted. Based on history and exam, low concern for small bowel obstruction however will evaluate with plain film. Abdominal labs obtained in triage are reassuring without leukocytosis, anemia, electrolyte abnormality. Troponin obtained in triage negative, and patient is without chest pain. ACS very unlikely in this atypical patient. More likely is constipation based on history, exam.  KUB shows moderate stool burden. No evidence of obstruction on KUB. Repeat abdominal exam remains benign. Constipation most likely. Discussed with patient the findings and he is amenable to outpatient therapy. Will treat him with MiraLax as well as enema when necessary as an outpatient. Patient is to return to the ED if no improvement and no bowel movements. He is also to return if he should develop fever, nausea, vomiting, increasing abdominal pain. He  voiced understanding. He will followup with his PCP otherwise.  This patient was discussed with my attending, Dr. Fayrene Fearing.  Dorna Leitz, MD 07/04/13 [phone]

## 2013-07-03 NOTE — ED Notes (Signed)
Pt reports no BM in 4 days.  Pt took OTC medication and got sick on his stomach following taking two of the medications, but does not remember the name.

## 2013-07-04 NOTE — ED Provider Notes (Addendum)
I saw and evaluated the patient, reviewed the resident's note and I agree with the findings and plan.  Pt seen by myself.  Discussed with Dr. Durward Fortes.  Constipation for several days.  Benign abdomen.  Plan as per Dr. Durward Fortes.  Resident EKG interpretation reviewed,  EKG reviewed.  I agree with innterpretation.   Roney Marion, MD 07/04/13 1345  Roney Marion, MD 07/12/13 [phone]

## 2013-07-21 ENCOUNTER — Emergency Department (HOSPITAL_COMMUNITY)
Admission: EM | Admit: 2013-07-21 | Discharge: 2013-07-21 | Disposition: A | Discharge status: Home / Self Care | Payer: Medicare Other | Attending: Emergency Medicine | Admitting: Emergency Medicine

## 2013-07-21 ENCOUNTER — Encounter (HOSPITAL_COMMUNITY): Payer: Self-pay | Admitting: Emergency Medicine

## 2013-07-21 ENCOUNTER — Emergency Department (HOSPITAL_COMMUNITY): Payer: Medicare Other

## 2013-07-21 DIAGNOSIS — R300 Dysuria: Secondary | ICD-10-CM | POA: Insufficient documentation

## 2013-07-21 DIAGNOSIS — R1031 Right lower quadrant pain: Secondary | ICD-10-CM | POA: Insufficient documentation

## 2013-07-21 DIAGNOSIS — R34 Anuria and oliguria: Secondary | ICD-10-CM | POA: Insufficient documentation

## 2013-07-21 DIAGNOSIS — I10 Essential (primary) hypertension: Secondary | ICD-10-CM | POA: Insufficient documentation

## 2013-07-21 DIAGNOSIS — K5900 Constipation, unspecified: Secondary | ICD-10-CM | POA: Insufficient documentation

## 2013-07-21 DIAGNOSIS — R109 Unspecified abdominal pain: Secondary | ICD-10-CM

## 2013-07-21 DIAGNOSIS — Z79899 Other long term (current) drug therapy: Secondary | ICD-10-CM | POA: Insufficient documentation

## 2013-07-21 DIAGNOSIS — E7800 Pure hypercholesterolemia, unspecified: Secondary | ICD-10-CM | POA: Insufficient documentation

## 2013-07-21 LAB — COMPREHENSIVE METABOLIC PANEL
ALT: 12 U/L (ref 0–53)
AST: 29 U/L (ref 0–37)
Alkaline Phosphatase: 138 U/L — ABNORMAL HIGH (ref 39–117)
CO2: 29 mEq/L (ref 19–32)
Calcium: 9.3 mg/dL (ref 8.4–10.5)
GFR calc Af Amer: 70 mL/min — ABNORMAL LOW (ref >90)
GFR calc non Af Amer: 61 mL/min — ABNORMAL LOW (ref >90)
Glucose, Bld: 92 mg/dL (ref 70–99)
Potassium: 5.2 mEq/L — ABNORMAL HIGH (ref 3.5–5.1)
Sodium: 136 mEq/L (ref 135–145)

## 2013-07-21 LAB — CBC WITH DIFFERENTIAL/PLATELET
Basophils Absolute: 0 10*3/uL (ref 0.0–0.1)
Basophils Relative: 0 % (ref 0–1)
Eosinophils Absolute: 0.1 10*3/uL (ref 0.0–0.7)
HCT: 42.3 % (ref 39.0–52.0)
Hemoglobin: 14.6 g/dL (ref 13.0–17.0)
MCH: 28.7 pg (ref 26.0–34.0)
MCHC: 34.5 g/dL (ref 30.0–36.0)
Monocytes Absolute: 1 10*3/uL (ref 0.1–1.0)
Monocytes Relative: 10 % (ref 3–12)
Neutro Abs: 7.2 10*3/uL (ref 1.7–7.7)
Neutrophils Relative %: 73 % (ref 43–77)
Platelets: 333 10*3/uL (ref 150–400)
RBC: 5.08 MIL/uL (ref 4.22–5.81)
RDW: 12.8 % (ref 11.5–15.5)
WBC: 9.7 10*3/uL (ref 4.0–10.5)

## 2013-07-21 LAB — URINALYSIS, ROUTINE W REFLEX MICROSCOPIC
Bilirubin Urine: NEGATIVE
Hgb urine dipstick: NEGATIVE
Nitrite: NEGATIVE
Protein, ur: 100 mg/dL — AB
Specific Gravity, Urine: 1.005 (ref 1.005–1.030)
Urobilinogen, UA: 0.2 mg/dL (ref 0.0–1.0)
pH: 5.5 (ref 5.0–8.0)

## 2013-07-21 MED ORDER — IOHEXOL 300 MG/ML  SOLN
100.0000 mL | Freq: Once | INTRAMUSCULAR | Status: AC | PRN
  Administered 2013-07-21: 100 mL via INTRAVENOUS

## 2013-07-21 MED ORDER — SODIUM CHLORIDE 0.9 % IV SOLN
Freq: Once | INTRAVENOUS | Status: AC
  Administered 2013-07-21: 13:00:00 via INTRAVENOUS

## 2013-07-21 MED ORDER — IOHEXOL 300 MG/ML  SOLN
25.0000 mL | INTRAMUSCULAR | Status: DC | PRN
  Administered 2013-07-21: 25 mL via ORAL

## 2013-07-21 MED ORDER — MORPHINE SULFATE 2 MG/ML IJ SOLN
2.0000 mg | INTRAMUSCULAR | Status: DC | PRN
  Administered 2013-07-21: 2 mg via INTRAVENOUS
  Filled 2013-07-21: qty 1

## 2013-07-21 MED ORDER — POLYETHYLENE GLYCOL 3350 17 GM/SCOOP PO POWD: 17.0000 g | Freq: Every day | ORAL | Status: DC | PRN

## 2013-07-21 MED ORDER — ONDANSETRON HCL 4 MG/2ML IJ SOLN
4.0000 mg | Freq: Once | INTRAMUSCULAR | Status: AC
  Administered 2013-07-21: 4 mg via INTRAVENOUS
  Filled 2013-07-21: qty 2

## 2013-07-21 NOTE — ED Notes (Signed)
Pt reports sudden onset 30 mins ago of severe mid abd pain. Denies any n/v/d or urinary symptoms

## 2013-07-21 NOTE — ED Notes (Signed)
Patient transported to CT 

## 2013-07-21 NOTE — ED Provider Notes (Signed)
CSN: [id number]     Arrival date & time 07/21/13  1246 History   First MD Initiated Contact with Patient 07/21/13 1247     Chief Complaint  Patient presents with  . Abdominal Pain    HPI  Patient is a bowel movement for 3-4 days has had right lower quadrant pain for 3-4 days. Became much worse today well localized painful to touch. Not vomiting. Has had a good appetite until today. Does not know the last time he urinated but thinks it may have been a few days also. He's had intermittent and recurrent episodes of constipation. Sent to ER visits related to constipation. No abdominal surgeries. No history of hernias. No history of  prostate problems.  Past Medical History  Diagnosis Date  . Hypertension   . Hypercholesterolemia    Past Surgical History  Procedure Laterality Date  . Ankle surgery Right    History reviewed. No pertinent family history. History  Substance Use Topics  . Smoking status: Never Smoker   . Smokeless tobacco: Not on file  . Alcohol Use: No    Review of Systems  Constitutional: Negative for fever, chills, diaphoresis, appetite change and fatigue.  HENT: Negative for mouth sores, sore throat and trouble swallowing.   Eyes: Negative for visual disturbance.  Respiratory: Negative for cough, chest tightness, shortness of breath and wheezing.   Cardiovascular: Negative for chest pain.  Gastrointestinal: Positive for abdominal pain and constipation. Negative for nausea, vomiting, diarrhea and abdominal distention.  Endocrine: Negative for polydipsia, polyphagia and polyuria.  Genitourinary: Positive for decreased urine volume and difficulty urinating. Negative for dysuria, frequency and hematuria.  Musculoskeletal: Negative for gait problem.  Skin: Negative for color change, pallor and rash.  Neurological: Negative for dizziness, syncope, light-headedness and headaches.  Hematological: Does not bruise/bleed easily.  Psychiatric/Behavioral: Negative for  behavioral problems and confusion.    Allergies  Review of patient's allergies indicates no known allergies.  Home Medications   Current Outpatient Rx  Name  Route  Sig  Dispense  Refill  . amLODipine (NORVASC) 10 MG tablet   Oral   Take 10 mg by mouth daily.         Marland Kitchen lisinopril (PRINIVIL,ZESTRIL) 10 MG tablet   Oral   Take 10 mg by mouth daily.         . polyethylene glycol powder (MIRALAX) powder   Oral   Take 17 g by mouth daily.   255 g   0   . pravastatin (PRAVACHOL) 40 MG tablet   Oral   Take 40 mg by mouth daily.         . Rectal Cleansers (FLEET [employer] CLEANSING ENEMA) ENEM   Rectal   Place 1 enema rectally once as needed.   1 enema   0   . polyethylene glycol powder (GLYCOLAX/MIRALAX) powder   Oral   Take 17 g by mouth daily as needed.   255 g   0    BP 143/75  Pulse 66  Temp(Src) 98.5 F (36.9 C) (Oral)  Resp 29  SpO2 97% Physical Exam  Constitutional: He is oriented to person, place, and time. He appears well-developed and well-nourished. No distress.  Elderly male. Awake and alert. He is able to provide details of his history  HENT:  Head: Normocephalic.  Eyes: Conjunctivae are normal. Pupils are equal, round, and reactive to light. No scleral icterus.  Neck: Normal range of motion. Neck supple. No thyromegaly present.  Cardiovascular: Normal rate and regular  rhythm.  Exam reveals no gallop and no friction rub.   No murmur heard. Pulmonary/Chest: Effort normal and breath sounds normal. No respiratory distress. He has no wheezes. He has no rales.  Abdominal: Soft. Bowel sounds are normal. He exhibits no distension. There is no tenderness. There is no rebound.    Genitourinary:        Musculoskeletal: Normal range of motion.  Neurological: He is alert and oriented to person, place, and time.  Skin: Skin is warm and dry. No rash noted.  Psychiatric: He has a normal mood and affect. His behavior is normal.    ED Course    Procedures (including critical care time) Labs Review Labs Reviewed  COMPREHENSIVE METABOLIC PANEL - Abnormal; Notable for the following:    Potassium 5.2 (*)    Albumin 3.4 (*)    Alkaline Phosphatase 138 (*)    GFR calc non Af Amer 61 (*)    GFR calc Af Amer 70 (*)    All other components within normal limits  URINALYSIS, ROUTINE W REFLEX MICROSCOPIC - Abnormal; Notable for the following:    Protein, ur 100 (*)    All other components within normal limits  CBC WITH DIFFERENTIAL  URINE MICROSCOPIC-ADD ON   Imaging Review Ct Abdomen Pelvis W Contrast  07/21/2013   CLINICAL DATA:  Acute onset severe abdominal pain.  EXAM: CT ABDOMEN AND PELVIS WITH CONTRAST  TECHNIQUE: Multidetector CT imaging of the abdomen and pelvis was performed using the standard protocol following bolus administration of intravenous contrast.  CONTRAST:  OMNIPAQUE IOHEXOL 300 MG/ML  SOLN  COMPARISON:  07/28/2004  FINDINGS: The liver, gallbladder, spleen, pancreas, adrenal glands, and right kidney are normal in appearance. A large simple left renal cyst is again seen, however there is no evidence of renal mass or hydronephrosis.  Moderately enlarged prostate shows further increase in size since 2005. Urinary bladder is nondilated. No other pelvic masses or lymphadenopathy identified. No abdominal soft tissue masses or lymphadenopathy identified.  No evidence of inflammatory process or abnormal fluid collections. No evidence of dilated bowel loops. Normal appendix is visualized. Diverticulosis is noted, however there is no evidence of diverticulitis. Atherosclerotic calcification of aorta noted, without evidence of aneurysm. Severe lumbar spine degenerative changes and scoliosis noted as well as old left lower rib fracture deformities.  IMPRESSION: No acute findings.  Diverticulosis. No radiographic evidence of diverticulitis.  Moderately enlarged prostate.   Electronically Signed   By: Myles Rosenthal M.D.   On: 07/21/2013  15:46    EKG Interpretation   None       MDM   1. Constipation   2. Abdominal pain       Pain is reproducible, but is not as  tender as I would expect with appendicitis. He does not have localized or diffuse peritoneal irritation. CT scan is planned with by mouth and IV contrast.  CT scan returns and shows no acute abnormalities. Normal appendix. No inflammatory processes normal stool burden. He is appropriate for treatment he was given one dose of pain control here is totally benign abdomen taking by mouth. Limited MiraLax at home recheck with any additional symptoms.  Roney Marion, MD 07/21/13 [phone]

## 2013-07-21 NOTE — ED Notes (Signed)
MD at bedside. 

## 2013-07-26 ENCOUNTER — Emergency Department (HOSPITAL_COMMUNITY)
Admission: EM | Admit: 2013-07-26 | Discharge: 2013-07-26 | Discharge status: Against Medical Advice | Payer: Medicare Other | Attending: Emergency Medicine | Admitting: Emergency Medicine

## 2013-07-26 DIAGNOSIS — R109 Unspecified abdominal pain: Secondary | ICD-10-CM | POA: Insufficient documentation

## 2013-07-26 DIAGNOSIS — I10 Essential (primary) hypertension: Secondary | ICD-10-CM | POA: Insufficient documentation

## 2013-07-26 LAB — COMPREHENSIVE METABOLIC PANEL
ALT: 15 U/L (ref 0–53)
AST: 23 U/L (ref 0–37)
Alkaline Phosphatase: 148 U/L — ABNORMAL HIGH (ref 39–117)
CO2: 27 mEq/L (ref 19–32)
Chloride: 100 mEq/L (ref 96–112)
GFR calc Af Amer: 50 mL/min — ABNORMAL LOW (ref >90)
GFR calc non Af Amer: 43 mL/min — ABNORMAL LOW (ref >90)
Glucose, Bld: 95 mg/dL (ref 70–99)
Potassium: 4 mEq/L (ref 3.5–5.1)
Sodium: 136 mEq/L (ref 135–145)
Total Bilirubin: 0.5 mg/dL (ref 0.3–1.2)

## 2013-07-26 LAB — CBC WITH DIFFERENTIAL/PLATELET
Basophils Absolute: 0 10*3/uL (ref 0.0–0.1)
Basophils Relative: 0 % (ref 0–1)
Eosinophils Relative: 1 % (ref 0–5)
HCT: 43.4 % (ref 39.0–52.0)
MCHC: 34.8 g/dL (ref 30.0–36.0)
MCV: 83.5 fL (ref 78.0–100.0)
Monocytes Absolute: 0.8 10*3/uL (ref 0.1–1.0)
Neutro Abs: 7.7 10*3/uL (ref 1.7–7.7)
Platelets: 292 10*3/uL (ref 150–400)
RDW: 12.9 % (ref 11.5–15.5)

## 2013-07-26 NOTE — ED Notes (Signed)
Unable to locate patient when called for room, pt was witnessed leaving department by others

## 2013-07-26 NOTE — ED Notes (Addendum)
Pt reports he has had RLQ pain for several weeks that is nonradiating. Reports that he was seen here last week for this pain. Denies N,V. Reports mild diarr. Denies chest pain or SOB.

## 2013-07-29 ENCOUNTER — Emergency Department (HOSPITAL_COMMUNITY)
Admission: EM | Admit: 2013-07-29 | Discharge: 2013-07-29 | Disposition: A | Discharge status: Home / Self Care | Payer: Medicare Other | Attending: Emergency Medicine | Admitting: Emergency Medicine

## 2013-07-29 ENCOUNTER — Encounter (HOSPITAL_COMMUNITY): Payer: Self-pay | Admitting: Emergency Medicine

## 2013-07-29 DIAGNOSIS — N419 Inflammatory disease of prostate, unspecified: Secondary | ICD-10-CM | POA: Insufficient documentation

## 2013-07-29 DIAGNOSIS — I10 Essential (primary) hypertension: Secondary | ICD-10-CM | POA: Insufficient documentation

## 2013-07-29 DIAGNOSIS — Z79899 Other long term (current) drug therapy: Secondary | ICD-10-CM | POA: Insufficient documentation

## 2013-07-29 DIAGNOSIS — E7800 Pure hypercholesterolemia, unspecified: Secondary | ICD-10-CM | POA: Insufficient documentation

## 2013-07-29 DIAGNOSIS — K5900 Constipation, unspecified: Secondary | ICD-10-CM | POA: Insufficient documentation

## 2013-07-29 LAB — URINALYSIS, ROUTINE W REFLEX MICROSCOPIC
Ketones, ur: NEGATIVE mg/dL
Nitrite: NEGATIVE
Specific Gravity, Urine: 1.027 (ref 1.005–1.030)
Urobilinogen, UA: 0.2 mg/dL (ref 0.0–1.0)
pH: 5.5 (ref 5.0–8.0)

## 2013-07-29 LAB — URINE MICROSCOPIC-ADD ON

## 2013-07-29 MED ORDER — SULFAMETHOXAZOLE-TRIMETHOPRIM 800-160 MG PO TABS: 1.0000 | ORAL_TABLET | Freq: Two times a day (BID) | ORAL | Status: DC

## 2013-07-29 MED ORDER — HYDROCODONE-ACETAMINOPHEN 5-325 MG PO TABS: 2.0000 | ORAL_TABLET | ORAL | Status: DC | PRN

## 2013-07-29 NOTE — Progress Notes (Signed)
Patient is from [facility].  As per patient's chart, patient's pcp is Dr. Gearlean Alf.  System updated.

## 2013-07-29 NOTE — ED Provider Notes (Signed)
CSN: [id number]     Arrival date & time 07/29/13  1219 History   First MD Initiated Contact with Patient 07/29/13 1247     Chief Complaint  Patient presents with  . Groin Pain    HPI  Patient presents again for evaluation of right groin pain he had several admissions recently for similar thing. He been diagnosed with constipation. He says normal CT scans. He is not vomiting. He has not had fever. He has pain with urination. Normally is incontinent. He has had difficulty passing his urine or last several days it is more uncomfortable to him. Most recent CT does show prostate enlargement  Past Medical History  Diagnosis Date  . Hypertension   . Hypercholesterolemia    Past Surgical History  Procedure Laterality Date  . Ankle surgery Right    History reviewed. No pertinent family history. History  Substance Use Topics  . Smoking status: Never Smoker   . Smokeless tobacco: Not on file  . Alcohol Use: No    Review of Systems  Constitutional: Negative for fever, chills, diaphoresis, appetite change and fatigue.  HENT: Negative for mouth sores, sore throat and trouble swallowing.   Eyes: Negative for visual disturbance.  Respiratory: Negative for cough, chest tightness, shortness of breath and wheezing.   Cardiovascular: Negative for chest pain.  Gastrointestinal: Positive for abdominal pain. Negative for nausea, vomiting, diarrhea and abdominal distention.       Suprapubic and slightly right suprapubic pain.  Endocrine: Positive for polyuria. Negative for polydipsia and polyphagia.  Genitourinary: Positive for dysuria. Negative for frequency, hematuria, scrotal swelling and testicular pain.  Musculoskeletal: Negative for gait problem.  Skin: Negative for color change, pallor and rash.  Neurological: Negative for dizziness, syncope, light-headedness and headaches.  Hematological: Does not bruise/bleed easily.  Psychiatric/Behavioral: Negative for behavioral problems and confusion.     Allergies  Review of patient's allergies indicates no known allergies.  Home Medications   Current Outpatient Rx  Name  Route  Sig  Dispense  Refill  . amLODipine (NORVASC) 10 MG tablet   Oral   Take 10 mg by mouth daily.         Marland Kitchen lisinopril (PRINIVIL,ZESTRIL) 10 MG tablet   Oral   Take 10 mg by mouth daily.         . pravastatin (PRAVACHOL) 40 MG tablet   Oral   Take 40 mg by mouth daily.         Marland Kitchen HYDROcodone-acetaminophen (NORCO/VICODIN) 5-325 MG per tablet   Oral   Take 2 tablets by mouth every 4 (four) hours as needed.   10 tablet   0   . sulfamethoxazole-trimethoprim (SEPTRA DS) 800-160 MG per tablet   Oral   Take 1 tablet by mouth every 12 (twelve) hours.   10 tablet   0    BP 168/85  Pulse 87  Temp(Src) 97.7 F (36.5 C) (Oral)  Resp 14  SpO2 100% Physical Exam  Constitutional: He is oriented to person, place, and time. He appears well-developed and well-nourished. No distress.  HENT:  Head: Normocephalic.  Eyes: Conjunctivae are normal. Pupils are equal, round, and reactive to light. No scleral icterus.  Neck: Normal range of motion. Neck supple. No thyromegaly present.  Cardiovascular: Normal rate and regular rhythm.  Exam reveals no gallop and no friction rub.   No murmur heard. Pulmonary/Chest: Effort normal and breath sounds normal. No respiratory distress. He has no wheezes. He has no rales.  Abdominal: Soft. Bowel sounds  are normal. He exhibits no distension. There is no tenderness. There is no rebound.  Genitourinary:     Musculoskeletal: Normal range of motion.  Neurological: He is alert and oriented to person, place, and time.  Skin: Skin is warm and dry. No rash noted.  Psychiatric: He has a normal mood and affect. His behavior is normal.    ED Course  Procedures (including critical care time) Labs Review Labs Reviewed  URINALYSIS, ROUTINE W REFLEX MICROSCOPIC - Abnormal; Notable for the following:    Bilirubin Urine SMALL  (*)    Protein, ur 100 (*)    All other components within normal limits  URINE MICROSCOPIC-ADD ON - Abnormal; Notable for the following:    Casts HYALINE CASTS (*)    All other components within normal limits  URINE CULTURE   Imaging Review No results found.  EKG Interpretation   None       MDM   1. Prostatitis    His exam does not suggest an intraperitoneal process. He had recent CT which was benign. He is not vomiting. His exam isn't consistent. His urine smells strong. He normally wears a depends garment for incontinence. He states lately it has been more difficult to pass his urine is does not have incontinence. He had an enlarged prostate on recent CT. Rectal exam here does reproduce her symptoms for him with his pain. He has a normal testicular and scrotal exam. He does not have a hernia.    Roney Marion, MD 07/29/13 [phone]

## 2013-07-29 NOTE — ED Notes (Signed)
Pt states he has right groin pain that has been going on for approx month

## 2013-07-30 LAB — URINE CULTURE: Culture: NO GROWTH

## 2013-09-23 ENCOUNTER — Emergency Department (INDEPENDENT_AMBULATORY_CARE_PROVIDER_SITE_OTHER)
Admission: EM | Admit: 2013-09-23 | Discharge: 2013-09-23 | Disposition: A | Discharge status: Home / Self Care | Payer: Medicare Other | Source: Home / Self Care

## 2013-09-23 ENCOUNTER — Emergency Department (INDEPENDENT_AMBULATORY_CARE_PROVIDER_SITE_OTHER): Payer: Medicare Other

## 2013-09-23 ENCOUNTER — Encounter (HOSPITAL_COMMUNITY): Payer: Self-pay | Admitting: Emergency Medicine

## 2013-09-23 DIAGNOSIS — K5900 Constipation, unspecified: Secondary | ICD-10-CM

## 2013-09-23 MED ORDER — POLYETHYLENE GLYCOL 3350 17 GM/SCOOP PO POWD: 17.0000 g | Freq: Every day | ORAL | Status: DC

## 2013-09-23 MED ORDER — BISACODYL 5 MG PO TBEC: DELAYED_RELEASE_TABLET | ORAL | Status: DC

## 2013-09-23 NOTE — Discharge Instructions (Signed)
Constipation, Adult Constipation is when a person:  Poops (bowel movement) less than 3 times a week.  Has a hard time pooping.  Has poop that is dry, hard, or bigger than normal. HOME CARE   Eat more fiber, such as fruits, vegetables, whole grains like brown rice, and beans.  Eat less fatty foods and sugar. This includes French fries, hamburgers, cookies, candy, and soda.  If you are not getting enough fiber from food, take products with added fiber in them (supplements).  Drink enough fluid to keep your pee (urine) clear or pale yellow.  Go to the restroom when you feel like you need to poop. Do not hold it.  Only take medicine as told by your doctor. Do not take medicines that help you poop (laxatives) without talking to your doctor first.  Exercise on a regular basis, or as told by your doctor. GET HELP RIGHT AWAY IF:   You have bright red blood in your poop (stool).  Your constipation lasts more than 4 days or gets worse.  You have belly (abdomen) or butt (rectal) pain.  You have thin poop (as thin as a pencil).  You lose weight, and it cannot be explained. MAKE SURE YOU:   Understand these instructions.  Will watch your condition.  Will get help right away if you are not doing well or get worse. Document Released: 02/22/2008 Document Revised: 11/28/2011 Document Reviewed: 08/09/2011 [employer] Patient Information 2014 [employer].  

## 2013-09-23 NOTE — ED Notes (Signed)
C/o constipation since last Friday States he does have abd pain Has tried laxative but no relief.

## 2013-09-23 NOTE — ED Provider Notes (Signed)
CSN: [id number]     Arrival date & time 09/23/13  1457 History   First MD Initiated Contact with Patient 09/23/13 1715     Chief Complaint  Patient presents with  . Constipation   (Consider location/radiation/quality/duration/timing/severity/associated sxs/prior Treatment) HPI Comments: 80 year old male complaining of generalized abdominal pain associated with constipation without having a bowel movement in 4 days. He is not taking any medications. He is on a calcium channel blocker. Denies vomiting. Drinking liquids.   Past Medical History  Diagnosis Date  . Hypertension   . Hypercholesterolemia    Past Surgical History  Procedure Laterality Date  . Ankle surgery Right    History reviewed. No pertinent family history. History  Substance Use Topics  . Smoking status: Never Smoker   . Smokeless tobacco: Not on file  . Alcohol Use: No    Review of Systems  Constitutional: Positive for activity change and appetite change.  Gastrointestinal: Positive for abdominal pain and constipation. Negative for nausea, vomiting, abdominal distention and rectal pain.  All other systems reviewed and are negative.    Allergies  Review of patient's allergies indicates no known allergies.  Home Medications   Current Outpatient Rx  Name  Route  Sig  Dispense  Refill  . bisacodyl (DULCOLAX) 5 MG EC tablet      1 tab q hs Prn constipation   14 tablet   0   . lisinopril (PRINIVIL,ZESTRIL) 10 MG tablet   Oral   Take 10 mg by mouth daily.         . polyethylene glycol powder (GLYCOLAX/MIRALAX) powder   Oral   Take 17 g by mouth daily.   255 g   0   . pravastatin (PRAVACHOL) 40 MG tablet   Oral   Take 40 mg by mouth daily.          BP 140/71  Pulse 75  Temp(Src) 97.8 F (36.6 C) (Oral)  Resp 16  SpO2 99% Physical Exam  Nursing note and vitals reviewed. Constitutional: He is oriented to person, place, and time. He appears well-developed and well-nourished. No distress.   Cardiovascular: Normal rate, regular rhythm and normal heart sounds.   Pulmonary/Chest: Effort normal and breath sounds normal. No respiratory distress.  Abdominal: Soft. He exhibits no distension. There is no rebound and no guarding.  Bowel sounds are hypoactive Minor generalized discomfort with deep palpation.  Genitourinary:  There is hard stool and the rectum. Guaiac is negative. Normal sphincter tone  Neurological: He is alert and oriented to person, place, and time. He exhibits normal muscle tone.  Skin: Skin is warm and dry.    ED Course  Procedures (including critical care time) Labs Review Labs Reviewed  OCCULT BLOOD X 1 CARD TO LAB, STOOL   Imaging Review Dg Abd 1 View  09/23/2013   CLINICAL DATA:  Constipation.  EXAM: ABDOMEN - 1 VIEW  COMPARISON:  07/21/2013  FINDINGS: Single view of the abdomen demonstrates stool throughout the abdomen. Gas within the stomach and bowel gas in the right upper abdomen. Multiple calcifications in the pelvis. There is scoliosis in the lumbar spine.  IMPRESSION: Nonspecific bowel gas pattern with moderate-large stool burden.   Electronically Signed   By: Richarda OverlieAdam  Henn M.D.   On: 09/23/2013 17:53      MDM   1. Constipation      80 year old male with a history of frequent episodes of constipation many of which he has been seen by his PCP as well as the  emergency department. He has had normal CT exams. Today the abdominal film shows no signs of obstruction but moderate colonic stool burden. Patient has no vomiting and unable to hold down fluids. His history today is near identical to his presenting symptoms when seen by the ED or PCP. His given a prescription for MiraLax with instructions as well is taking one Dulcolax daily. Drink plenty of fluids and see your doctor soon. Your physician may want to change some your medications and refill others.  Hayden Rasmussen, NP 09/23/13 [phone]

## 2013-09-23 NOTE — ED Notes (Signed)
Patient was not undressed.

## 2013-09-24 LAB — OCCULT BLOOD, POC DEVICE: Fecal Occult Bld: NEGATIVE

## 2013-09-24 NOTE — ED Provider Notes (Signed)
Medical screening examination/treatment/procedure(s) were performed by resident physician or non-physician practitioner and as supervising physician I was immediately available for consultation/collaboration.   Donyell Ding DOUGLAS MD.   Izabelle Daus D Bralon Antkowiak, MD 09/24/13 0852 

## 2013-10-24 ENCOUNTER — Encounter: Payer: Self-pay | Admitting: Cardiology

## 2013-10-24 DIAGNOSIS — R269 Unspecified abnormalities of gait and mobility: Secondary | ICD-10-CM

## 2013-10-24 DIAGNOSIS — R32 Unspecified urinary incontinence: Secondary | ICD-10-CM

## 2013-10-24 DIAGNOSIS — R0989 Other specified symptoms and signs involving the circulatory and respiratory systems: Secondary | ICD-10-CM

## 2013-10-24 DIAGNOSIS — I4891 Unspecified atrial fibrillation: Secondary | ICD-10-CM

## 2014-04-15 ENCOUNTER — Ambulatory Visit
Admission: RE | Admit: 2014-04-15 | Discharge: 2014-04-15 | Disposition: A | Discharge status: Home / Self Care | Payer: Medicare Other | Source: Ambulatory Visit | Attending: Internal Medicine | Admitting: Internal Medicine

## 2014-04-15 ENCOUNTER — Other Ambulatory Visit: Payer: Self-pay | Admitting: Internal Medicine

## 2014-04-15 DIAGNOSIS — M5489 Other dorsalgia: Secondary | ICD-10-CM

## 2014-08-15 ENCOUNTER — Emergency Department (HOSPITAL_COMMUNITY): Payer: Medicare Other

## 2014-08-15 ENCOUNTER — Emergency Department (HOSPITAL_COMMUNITY)
Admission: EM | Admit: 2014-08-15 | Discharge: 2014-08-15 | Disposition: A | Discharge status: Home / Self Care | Payer: Medicare Other | Attending: Emergency Medicine | Admitting: Emergency Medicine

## 2014-08-15 ENCOUNTER — Encounter (HOSPITAL_COMMUNITY): Payer: Self-pay | Admitting: *Deleted

## 2014-08-15 DIAGNOSIS — N529 Male erectile dysfunction, unspecified: Secondary | ICD-10-CM | POA: Insufficient documentation

## 2014-08-15 DIAGNOSIS — R1032 Left lower quadrant pain: Secondary | ICD-10-CM | POA: Diagnosis present

## 2014-08-15 DIAGNOSIS — R109 Unspecified abdominal pain: Secondary | ICD-10-CM | POA: Diagnosis not present

## 2014-08-15 DIAGNOSIS — Z87891 Personal history of nicotine dependence: Secondary | ICD-10-CM | POA: Diagnosis not present

## 2014-08-15 DIAGNOSIS — E780 Pure hypercholesterolemia: Secondary | ICD-10-CM | POA: Insufficient documentation

## 2014-08-15 DIAGNOSIS — R112 Nausea with vomiting, unspecified: Secondary | ICD-10-CM | POA: Insufficient documentation

## 2014-08-15 DIAGNOSIS — Z880 Allergy status to penicillin: Secondary | ICD-10-CM | POA: Insufficient documentation

## 2014-08-15 DIAGNOSIS — I10 Essential (primary) hypertension: Secondary | ICD-10-CM | POA: Insufficient documentation

## 2014-08-15 DIAGNOSIS — Z8711 Personal history of peptic ulcer disease: Secondary | ICD-10-CM | POA: Diagnosis not present

## 2014-08-15 DIAGNOSIS — Z7982 Long term (current) use of aspirin: Secondary | ICD-10-CM | POA: Diagnosis not present

## 2014-08-15 DIAGNOSIS — Z79899 Other long term (current) drug therapy: Secondary | ICD-10-CM | POA: Diagnosis not present

## 2014-08-15 DIAGNOSIS — K5900 Constipation, unspecified: Secondary | ICD-10-CM | POA: Diagnosis not present

## 2014-08-15 LAB — CBC WITH DIFFERENTIAL/PLATELET
BASOS ABS: 0 10*3/uL (ref 0.0–0.1)
BASOS PCT: 0 % (ref 0–1)
EOS PCT: 1 % (ref 0–5)
Eosinophils Absolute: 0.1 10*3/uL (ref 0.0–0.7)
HEMATOCRIT: 39.3 % (ref 39.0–52.0)
Hemoglobin: 12.9 g/dL — ABNORMAL LOW (ref 13.0–17.0)
Lymphocytes Relative: 18 % (ref 12–46)
Lymphs Abs: 1.6 10*3/uL (ref 0.7–4.0)
MCH: 28 pg (ref 26.0–34.0)
MCHC: 32.8 g/dL (ref 30.0–36.0)
MCV: 85.4 fL (ref 78.0–100.0)
Monocytes Absolute: 0.9 10*3/uL (ref 0.1–1.0)
Monocytes Relative: 10 % (ref 3–12)
Neutro Abs: 6.2 10*3/uL (ref 1.7–7.7)
Neutrophils Relative %: 71 % (ref 43–77)
Platelets: 294 10*3/uL (ref 150–400)
RBC: 4.6 MIL/uL (ref 4.22–5.81)
RDW: 13.5 % (ref 11.5–15.5)
WBC: 8.8 10*3/uL (ref 4.0–10.5)

## 2014-08-15 LAB — URINALYSIS, ROUTINE W REFLEX MICROSCOPIC
BILIRUBIN URINE: NEGATIVE
Glucose, UA: NEGATIVE mg/dL
Hgb urine dipstick: NEGATIVE
Ketones, ur: NEGATIVE mg/dL
Leukocytes, UA: NEGATIVE
Nitrite: NEGATIVE
PROTEIN: 30 mg/dL — AB
Specific Gravity, Urine: 1.018 (ref 1.005–1.030)
UROBILINOGEN UA: 1 mg/dL (ref 0.0–1.0)
pH: 6.5 (ref 5.0–8.0)

## 2014-08-15 LAB — LIPASE, BLOOD: Lipase: 42 U/L (ref 11–59)

## 2014-08-15 LAB — COMPREHENSIVE METABOLIC PANEL
ALBUMIN: 3.5 g/dL (ref 3.5–5.2)
ALT: 13 U/L (ref 0–53)
AST: 18 U/L (ref 0–37)
Alkaline Phosphatase: 147 U/L — ABNORMAL HIGH (ref 39–117)
Anion gap: 12 (ref 5–15)
BILIRUBIN TOTAL: 0.3 mg/dL (ref 0.3–1.2)
BUN: 19 mg/dL (ref 6–23)
CO2: 27 meq/L (ref 19–32)
Calcium: 9 mg/dL (ref 8.4–10.5)
Chloride: 101 mEq/L (ref 96–112)
Creatinine, Ser: 1.16 mg/dL (ref 0.50–1.35)
GFR calc Af Amer: 62 mL/min — ABNORMAL LOW (ref >90)
GFR, EST NON AFRICAN AMERICAN: 54 mL/min — AB (ref >90)
Glucose, Bld: 113 mg/dL — ABNORMAL HIGH (ref 70–99)
POTASSIUM: 4.5 meq/L (ref 3.7–5.3)
Sodium: 140 mEq/L (ref 137–147)
Total Protein: 7.2 g/dL (ref 6.0–8.3)

## 2014-08-15 LAB — URINE MICROSCOPIC-ADD ON

## 2014-08-15 LAB — LACTIC ACID, PLASMA: LACTIC ACID, VENOUS: 0.9 mmol/L (ref 0.5–2.2)

## 2014-08-15 MED ORDER — MORPHINE SULFATE 4 MG/ML IJ SOLN
6.0000 mg | Freq: Once | INTRAMUSCULAR | Status: AC
Start: 2014-08-15 — End: 2014-08-15
  Administered 2014-08-15: 4 mg via INTRAVENOUS
  Filled 2014-08-15: qty 2

## 2014-08-15 MED ORDER — IOHEXOL 300 MG/ML  SOLN
25.0000 mL | Freq: Once | INTRAMUSCULAR | Status: AC | PRN
  Administered 2014-08-15: 25 mL via ORAL

## 2014-08-15 MED ORDER — ONDANSETRON HCL 4 MG/2ML IJ SOLN
4.0000 mg | Freq: Once | INTRAMUSCULAR | Status: AC
  Administered 2014-08-15: 4 mg via INTRAVENOUS
  Filled 2014-08-15: qty 2

## 2014-08-15 MED ORDER — IOHEXOL 300 MG/ML  SOLN
100.0000 mL | Freq: Once | INTRAMUSCULAR | Status: AC | PRN
Start: 2014-08-15 — End: 2014-08-15
  Administered 2014-08-15: 100 mL via INTRAVENOUS

## 2014-08-15 MED ORDER — SODIUM CHLORIDE 0.9 % IV BOLUS (SEPSIS)
1000.0000 mL | Freq: Once | INTRAVENOUS | Status: AC
  Administered 2014-08-15: 1000 mL via INTRAVENOUS

## 2014-08-15 MED ORDER — SODIUM CHLORIDE 0.9 % IV BOLUS (SEPSIS): 1000.0000 mL | Freq: Once | INTRAVENOUS | Status: DC

## 2014-08-15 NOTE — ED Notes (Signed)
CT notified that the pt has finished his contrast.  

## 2014-08-15 NOTE — ED Notes (Signed)
Pt in c/o abd pain with n/v since yesterday, last BM was two days ago, no distress noted

## 2014-08-15 NOTE — ED Provider Notes (Signed)
CSN: [id number]     Arrival date & time 08/15/14  1715 History   First MD Initiated Contact with Patient 08/15/14 1818     Chief Complaint  Patient presents with  . Abdominal Pain     (Consider location/radiation/quality/duration/timing/severity/associated sxs/prior Treatment) Patient is a 80 y.o. male presenting with abdominal pain.  Abdominal Pain Pain location:  LLQ and RLQ Pain quality: aching   Pain radiates to:  Does not radiate Pain severity:  Mild Onset quality:  Gradual Duration:  2 days Timing:  Constant Chronicity:  New Context: not alcohol use, not previous surgeries, not retching and not suspicious food intake   Relieved by:  Nothing Ineffective treatments: miralax and colace. Associated symptoms: constipation, nausea and vomiting   Associated symptoms: no anorexia, no chest pain, no chills, no cough, no diarrhea, no dysuria, no fever, no melena and no shortness of breath     Past Medical History  Diagnosis Date  . Hypertension   . Hypercholesterolemia   . ED (erectile dysfunction)   . Urinary frequency     bladder instablity and outlet obstruction   . Constipation   . PUD (peptic ulcer disease)     history of   . BPH (benign prostatic hyperplasia)    Past Surgical History  Procedure Laterality Date  . Ankle surgery Right    History reviewed. No pertinent family history. History  Substance Use Topics  . Smoking status: Former Games developermoker  . Smokeless tobacco: Not on file  . Alcohol Use: No    Review of Systems  Constitutional: Negative for fever and chills.  HENT: Negative for congestion and rhinorrhea.   Eyes: Negative for pain.  Respiratory: Negative for cough and shortness of breath.   Cardiovascular: Negative for chest pain and palpitations.  Gastrointestinal: Positive for nausea, vomiting, abdominal pain and constipation. Negative for diarrhea, melena and anorexia.  Endocrine: Negative for polydipsia and polyuria.  Genitourinary: Negative for  dysuria and flank pain.  Musculoskeletal: Negative for back pain and neck pain.  Skin: Negative for color change and wound.  Neurological: Negative for dizziness, numbness and headaches.      Allergies  Penicillins  Home Medications   Prior to Admission medications   Medication Sig Start Date End Date Taking? Authorizing Provider  aspirin 81 MG tablet Take 81 mg by mouth daily.   Yes Historical Provider, MD  bisacodyl (DULCOLAX) 5 MG EC tablet 1 tab q hs Prn constipation Patient taking differently: Take 5 mg by mouth at bedtime as needed for moderate constipation.  09/23/13   Hayden Rasmussenavid Mabe, NP  diltiazem (CARDIZEM LA) 120 MG 24 hr tablet Take 120 mg by mouth daily.    Historical Provider, MD  esomeprazole (NEXIUM) 40 MG capsule Take 40 mg by mouth daily at 12 noon.    Historical Provider, MD  lisinopril (PRINIVIL,ZESTRIL) 10 MG tablet Take 10 mg by mouth daily.    Historical Provider, MD  polyethylene glycol powder (GLYCOLAX/MIRALAX) powder Take 17 g by mouth daily. 09/23/13   Hayden Rasmussenavid Mabe, NP  pravastatin (PRAVACHOL) 40 MG tablet Take 40 mg by mouth daily.    Historical Provider, MD  sildenafil (VIAGRA) 100 MG tablet Take 100 mg by mouth daily as needed for erectile dysfunction.    Historical Provider, MD  traMADol (ULTRAM) 50 MG tablet Take 50 mg by mouth every 6 (six) hours as needed for moderate pain.     Historical Provider, MD   BP 186/80 mmHg  Pulse 69  Resp 17  Ht  5\' 6"  (1.676 m)  Wt 176 lb (79.833 kg)  BMI 28.42 kg/m2  SpO2 96% Physical Exam  Constitutional: He is oriented to person, place, and time. He appears well-developed and well-nourished.  HENT:  Head: Normocephalic and atraumatic.  Eyes: Conjunctivae and EOM are normal. Pupils are equal, round, and reactive to light.  Neck: Normal range of motion.  Cardiovascular: Normal rate and regular rhythm.   Pulmonary/Chest: Effort normal and breath sounds normal.  Abdominal: Soft. He exhibits no distension. There is tenderness.  There is no rebound.  Musculoskeletal: Normal range of motion. He exhibits no edema or tenderness.  Neurological: He is alert and oriented to person, place, and time.  Skin: Skin is warm and dry.  Nursing note and vitals reviewed.   ED Course  Procedures (including critical care time) Labs Review Labs Reviewed  COMPREHENSIVE METABOLIC PANEL - Abnormal; Notable for the following:    Glucose, Bld 113 (*)    Alkaline Phosphatase 147 (*)    GFR calc non Af Amer 54 (*)    GFR calc Af Amer 62 (*)    All other components within normal limits  CBC WITH DIFFERENTIAL - Abnormal; Notable for the following:    Hemoglobin 12.9 (*)    All other components within normal limits  URINALYSIS, ROUTINE W REFLEX MICROSCOPIC - Abnormal; Notable for the following:    APPearance CLOUDY (*)    Protein, ur 30 (*)    All other components within normal limits  URINE MICROSCOPIC-ADD ON - Abnormal; Notable for the following:    Casts HYALINE CASTS (*)    All other components within normal limits  LIPASE, BLOOD  LACTIC ACID, PLASMA    Imaging Review Ct Abdomen Pelvis W Contrast  08/15/2014   CLINICAL DATA:  80 year old male with 3-4 day history of lower abdominal pain with nausea and vomiting.  EXAM: CT ABDOMEN AND PELVIS WITH CONTRAST  TECHNIQUE: Multidetector CT imaging of the abdomen and pelvis was performed using the standard protocol following bolus administration of intravenous contrast.  CONTRAST:  OMNIPAQUE IOHEXOL 300 MG/ML  SOLN  COMPARISON:  Abdominal radiographs obtained earlier today; prior CT abdomen/ pelvis 07/21/2013  FINDINGS: Lower Chest: Dependent atelectasis in the right lower lobe. Mild calcified pleural plaques overlying the right hemidiaphragm. No pleural effusion or focal consolidation. No suspicious pulmonary mass or nodule. Visualized cardiac structures within normal limits for size. No pericardial effusion. Unremarkable thoracic esophagus.  Abdomen: Unremarkable CT appearance of  the stomach, duodenum, spleen, adrenal glands and pancreas. Normal hepatic contour and morphology. No discrete hepatic lesion. Gallbladder is unremarkable. No intra or extrahepatic biliary ductal dilatation. No evidence of hydronephrosis or nephrolithiasis. 7.5 cm simple cyst exophytic from the anterior interpolar left kidney. Additional tiny sub cm hypo attenuating lesions bilaterally are too small for accurate characterization better statistically likely to represent benign cysts.  Moderate to high volume of retained stool in the colon and rectum suggesting underlying constipation. The appendix is normal. Evaluation for small omental nodules and peritoneal nodules is limited by motion artifact. No definite free fluid or suspicious adenopathy.  Pelvis: Marked prostatomegaly. The prostate gland measures 6.1 x 6.2 cm. The bladder is decompressed but appears thick walled. Left indirect inguinal hernia containing a loop of redundant sigmoid colon. No evidence of obstruction or inflammation. There is a small amount of fluid in the hernia sac. Omental fat containing right indirect inguinal hernia.  Bones/Soft Tissues: No acute fracture or aggressive appearing lytic or blastic osseous lesion. Multilevel degenerative  disc disease and lower lumbar facet arthropathy.  Vascular: Extensive atherosclerotic vascular calcification. The aorta is normal in caliber. 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  IMPRESSION: 1. No acute abnormality in the abdomen or pelvis. There is a mild image degradation secondary to motion artifact which slightly limits the overall evaluation. 2. Compared to 07/21/2013, there has been interval development of bilateral left larger than right indirect inguinal hernias. On the left, the hernia sac contains a loop of redundant sigmoid colon without evidence of obstruction or inflammation. There is also a small amount of free fluid inferiorly within the  hernia sac. On the right, the hernia sac contains omental fat and trace fluid. The cecum is directed toward the hernia sac but does not enter it. 3. Marked prostatomegaly. 4. The bladder is decompressed but appears somewhat thick walled. This may represent trabeculation related to chronic bladder outlet obstruction from prostatomegaly. 5. 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 6. Atherosclerosis. 7. Large simple cyst exophytic from the left kidney.   Electronically Signed   By: Malachy MoanHeath  McCullough M.D.   On: 08/15/2014 20:24   Dg Abd 2 Views  08/15/2014   CLINICAL DATA:  Severe stomach pain x2 weeks, vomiting, abdominal distention  EXAM: ABDOMEN - 2 VIEW  COMPARISON:  None.  FINDINGS: Nonspecific but nonobstructive bowel gas pattern, without does portion small bowel dilatation to suggest small bowel obstruction.  Moderate colonic stool burden, suggesting constipation.  Degenerative changes of the lumbar spine.  IMPRESSION: Nonobstructive bowel gas pattern.  Moderate colonic stool burden, suggesting constipation.   Electronically Signed   By: Charline BillsSriyesh  Krishnan M.D.   On: 08/15/2014 18:27     EKG Interpretation None      MDM   Final diagnoses:  Abdominal pain    80 yo M w/ abdominal pain, 2d w/o bowel movement, nausea and vomiting. Similar to previous. Initially ttp on exam, rectal exam normal. Labs normal, CT without evidence of incarcerated hernia, obstruction or other acute abdominal emergencies. Reexamination two times and patient with soft abdomen, no pain. No vomiting in ED, tolerating PO. Unsure of cause of patient ssymptoms, likely related to constipation. He will continue bowel regimen at home.     Marily MemosJason Aniayah Alaniz, MD 08/16/14 [id number]  Derwood KaplanAnkit Nanavati, MD 08/16/14 [phone]

## 2014-08-16 LAB — POC OCCULT BLOOD, ED: Fecal Occult Bld: NEGATIVE

## 2014-09-15 ENCOUNTER — Other Ambulatory Visit (INDEPENDENT_AMBULATORY_CARE_PROVIDER_SITE_OTHER): Payer: Self-pay | Admitting: General Surgery

## 2014-09-29 ENCOUNTER — Encounter (HOSPITAL_COMMUNITY): Payer: Self-pay | Admitting: Family Medicine

## 2014-09-29 ENCOUNTER — Emergency Department (HOSPITAL_COMMUNITY)
Admission: EM | Admit: 2014-09-29 | Discharge: 2014-09-29 | Disposition: A | Discharge status: Home / Self Care | Payer: Medicare Other | Attending: Emergency Medicine | Admitting: Emergency Medicine

## 2014-09-29 ENCOUNTER — Emergency Department (HOSPITAL_COMMUNITY): Payer: Medicare Other

## 2014-09-29 DIAGNOSIS — I10 Essential (primary) hypertension: Secondary | ICD-10-CM | POA: Diagnosis not present

## 2014-09-29 DIAGNOSIS — Z8711 Personal history of peptic ulcer disease: Secondary | ICD-10-CM | POA: Diagnosis not present

## 2014-09-29 DIAGNOSIS — Z87891 Personal history of nicotine dependence: Secondary | ICD-10-CM | POA: Insufficient documentation

## 2014-09-29 DIAGNOSIS — W19XXXA Unspecified fall, initial encounter: Secondary | ICD-10-CM

## 2014-09-29 DIAGNOSIS — R1030 Lower abdominal pain, unspecified: Secondary | ICD-10-CM

## 2014-09-29 DIAGNOSIS — Y9389 Activity, other specified: Secondary | ICD-10-CM | POA: Diagnosis not present

## 2014-09-29 DIAGNOSIS — W01198A Fall on same level from slipping, tripping and stumbling with subsequent striking against other object, initial encounter: Secondary | ICD-10-CM | POA: Diagnosis not present

## 2014-09-29 DIAGNOSIS — Y998 Other external cause status: Secondary | ICD-10-CM | POA: Diagnosis not present

## 2014-09-29 DIAGNOSIS — Y92031 Bathroom in apartment as the place of occurrence of the external cause: Secondary | ICD-10-CM | POA: Diagnosis not present

## 2014-09-29 DIAGNOSIS — Z8639 Personal history of other endocrine, nutritional and metabolic disease: Secondary | ICD-10-CM | POA: Diagnosis not present

## 2014-09-29 DIAGNOSIS — Z87438 Personal history of other diseases of male genital organs: Secondary | ICD-10-CM | POA: Diagnosis not present

## 2014-09-29 DIAGNOSIS — R109 Unspecified abdominal pain: Secondary | ICD-10-CM

## 2014-09-29 DIAGNOSIS — Z880 Allergy status to penicillin: Secondary | ICD-10-CM | POA: Diagnosis not present

## 2014-09-29 DIAGNOSIS — K4090 Unilateral inguinal hernia, without obstruction or gangrene, not specified as recurrent: Secondary | ICD-10-CM | POA: Diagnosis not present

## 2014-09-29 DIAGNOSIS — S0990XA Unspecified injury of head, initial encounter: Secondary | ICD-10-CM | POA: Insufficient documentation

## 2014-09-29 LAB — COMPREHENSIVE METABOLIC PANEL
ALBUMIN: 3.7 g/dL (ref 3.5–5.2)
ALK PHOS: 123 U/L — AB (ref 39–117)
ALT: 13 U/L (ref 0–53)
AST: 21 U/L (ref 0–37)
Anion gap: 5 (ref 5–15)
BUN: 17 mg/dL (ref 6–23)
CO2: 34 mmol/L — AB (ref 19–32)
CREATININE: 1.17 mg/dL (ref 0.50–1.35)
Calcium: 9.3 mg/dL (ref 8.4–10.5)
Chloride: 101 mEq/L (ref 96–112)
GFR calc Af Amer: 61 mL/min — ABNORMAL LOW (ref >90)
GFR calc non Af Amer: 53 mL/min — ABNORMAL LOW (ref >90)
Glucose, Bld: 68 mg/dL — ABNORMAL LOW (ref 70–99)
Potassium: 4.1 mmol/L (ref 3.5–5.1)
Sodium: 140 mmol/L (ref 135–145)
TOTAL PROTEIN: 7.6 g/dL (ref 6.0–8.3)
Total Bilirubin: 0.7 mg/dL (ref 0.3–1.2)

## 2014-09-29 LAB — URINALYSIS, ROUTINE W REFLEX MICROSCOPIC
BILIRUBIN URINE: NEGATIVE
GLUCOSE, UA: NEGATIVE mg/dL
HGB URINE DIPSTICK: NEGATIVE
Ketones, ur: NEGATIVE mg/dL
LEUKOCYTES UA: NEGATIVE
Nitrite: NEGATIVE
Protein, ur: 30 mg/dL — AB
SPECIFIC GRAVITY, URINE: 1.017 (ref 1.005–1.030)
Urobilinogen, UA: 0.2 mg/dL (ref 0.0–1.0)
pH: 7.5 (ref 5.0–8.0)

## 2014-09-29 LAB — URINE MICROSCOPIC-ADD ON

## 2014-09-29 LAB — CBC
HEMATOCRIT: 40.9 % (ref 39.0–52.0)
Hemoglobin: 13.5 g/dL (ref 13.0–17.0)
MCH: 27.3 pg (ref 26.0–34.0)
MCHC: 33 g/dL (ref 30.0–36.0)
MCV: 82.6 fL (ref 78.0–100.0)
PLATELETS: 269 10*3/uL (ref 150–400)
RBC: 4.95 MIL/uL (ref 4.22–5.81)
RDW: 13 % (ref 11.5–15.5)
WBC: 6.7 10*3/uL (ref 4.0–10.5)

## 2014-09-29 LAB — LIPASE, BLOOD: Lipase: 31 U/L (ref 11–59)

## 2014-09-29 LAB — CBG MONITORING, ED: GLUCOSE-CAPILLARY: 79 mg/dL (ref 70–99)

## 2014-09-29 MED ORDER — DEXTROSE 50 % IV SOLN
25.0000 mL | Freq: Once | INTRAVENOUS | Status: AC
  Administered 2014-09-29: 25 mL via INTRAVENOUS
  Filled 2014-09-29: qty 50

## 2014-09-29 MED ORDER — MORPHINE SULFATE 2 MG/ML IJ SOLN
2.0000 mg | Freq: Once | INTRAMUSCULAR | Status: AC
  Administered 2014-09-29: 2 mg via INTRAVENOUS
  Filled 2014-09-29: qty 1

## 2014-09-29 MED ORDER — TRAMADOL HCL 50 MG PO TABS: 25.0000 mg | ORAL_TABLET | Freq: Four times a day (QID) | ORAL | Status: DC | PRN

## 2014-09-29 MED ORDER — ONDANSETRON HCL 4 MG/2ML IJ SOLN
4.0000 mg | Freq: Once | INTRAMUSCULAR | Status: AC
  Administered 2014-09-29: 4 mg via INTRAVENOUS
  Filled 2014-09-29: qty 2

## 2014-09-29 MED ORDER — IOHEXOL 300 MG/ML  SOLN
100.0000 mL | Freq: Once | INTRAMUSCULAR | Status: AC | PRN
  Administered 2014-09-29: 100 mL via INTRAVENOUS

## 2014-09-29 NOTE — ED Notes (Signed)
Pt here for fall a few days ago. sts unsure what he hit abdomen on but it is hurting. Denies pain anywhere else. sts he hit his head.

## 2014-09-29 NOTE — Discharge Instructions (Signed)
Return to the ED with any concerns including vomiting and not able to keep down liquids,, chest pain, difficulty breathing, fainting, worsening pain, decreased level of alertness/lethargy, or any other alarming symptoms

## 2014-09-29 NOTE — ED Provider Notes (Signed)
CSN: [id number]     Arrival date & time 09/29/14  1233 History   First MD Initiated Contact with Patient 09/29/14 1301     Chief Complaint  Patient presents with  . Fall     (Consider location/radiation/quality/duration/timing/severity/associated sxs/prior Treatment) HPI  Pt presents with c/o diffuse abdominal pain.  He states he fell 2 days ago when rushing to the bathroom at home- he states he tripped and fell forward hitting his forehead. He c/o abdominal pain that he states started just after the fall.  Pain is diffuse.  Worse with certain positions and palpation.  He denies neck or back pain.  No pain in ribcage.  He has hx of prior abdominal pain associated with constipation.  Has known inguinal hernia.  However he states this pain is different than prior abdominal pain.  He does note that his last BM was 3 days ago.  There are no other associated systemic symptoms, there are no other alleviating or modifying factors.   Past Medical History  Diagnosis Date  . Hypertension   . Hypercholesterolemia   . ED (erectile dysfunction)   . Urinary frequency     bladder instablity and outlet obstruction   . Constipation   . PUD (peptic ulcer disease)     history of   . BPH (benign prostatic hyperplasia)    Past Surgical History  Procedure Laterality Date  . Ankle surgery Right    History reviewed. No pertinent family history. History  Substance Use Topics  . Smoking status: Former Games developermoker  . Smokeless tobacco: Not on file  . Alcohol Use: No    Review of Systems ROS reviewed and all otherwise negative except for mentioned in HPI   Allergies  Penicillins  Home Medications   Prior to Admission medications   Medication Sig Start Date End Date Taking? Authorizing Provider  bisacodyl (DULCOLAX) 5 MG EC tablet 1 tab q hs Prn constipation Patient not taking: Reported on 09/29/2014 09/23/13   Hayden Rasmussenavid Gaetan Spieker, NP  traMADol (ULTRAM) 50 MG tablet Take 0.5-1 tablets (25-50 mg total) by mouth  every 6 (six) hours as needed for severe pain. 09/29/14   Audree CamelScott T Goldston, MD   BP 156/80 mmHg  Pulse 66  Temp(Src) 98.6 F (37 C) (Oral)  Resp 19  SpO2 92%  Vitals reviewed Physical Exam  Physical Examination: General appearance - alert, well appearing, and in no distress Mental status - alert, oriented to person, place, and time Head- NCAT Eyes - no conjunctival injection, no scleral icterus Mouth - mucous membranes moist, pharynx normal without lesions Neck - no midline tenderness to palpation Chest - clear to auscultation, no wheezes, rales or rhonchi, symmetric air entry Heart - normal rate, regular rhythm, normal S1, S2, no murmurs, rubs, clicks or gallops Abdomen - soft, diffuse tenderness to palpation, no gaurding or rebound, no bruising or hematomas, nondistended, no masses or organomegaly Back exam - no midline tenderness to palpation, no CVA tenderness Extremities - peripheral pulses normal, no pedal edema, no clubbing or cyanosis Skin - normal coloration and turgor, no rashes  ED Course  Procedures (including critical care time) Labs Review Labs Reviewed  COMPREHENSIVE METABOLIC PANEL - Abnormal; Notable for the following:    CO2 34 (*)    Glucose, Bld 68 (*)    Alkaline Phosphatase 123 (*)    GFR calc non Af Amer 53 (*)    GFR calc Af Amer 61 (*)    All other components within normal limits  URINALYSIS, ROUTINE W REFLEX MICROSCOPIC - Abnormal; Notable for the following:    APPearance HAZY (*)    Protein, ur 30 (*)    All other components within normal limits  CBC  LIPASE, BLOOD  URINE MICROSCOPIC-ADD ON  CBG MONITORING, ED    Imaging Review Ct Head Wo Contrast  09/29/2014   CLINICAL DATA:  Syncopal episode 09/27/2014, fell and hit forehead, visible swelling LEFT frontal region, history hypertension, former smoker  EXAM: CT HEAD WITHOUT CONTRAST  TECHNIQUE: Contiguous axial images were obtained from the base of the skull through the vertex without intravenous  contrast.  COMPARISON:  05/21/2012  FINDINGS: Generalized atrophy.  Normal ventricular morphology.  No midline shift or mass effect.  Small vessel chronic ischemic changes of deep cerebral white matter.  Old lacunar infarct RIGHT basal ganglia.  No intracranial hemorrhage, mass lesion, or acute infarction.  Visualized paranasal sinuses and mastoid air cells clear.  Anterior nasal septal deformity inferiorly, question sequela of remote fracture.  Old fracture medial wall LEFT orbit.  Bones otherwise unremarkable.  Vertebrobasilar and internal carotid arterial calcifications at skullbase.  IMPRESSION: Atrophy with small vessel chronic ischemic changes of deep cerebral white matter.  Old RIGHT basal ganglia lacunar infarct.  No acute intracranial abnormalities.   Electronically Signed   By: Ulyses Southward M.D.   On: 09/29/2014 16:37   Ct Abdomen Pelvis W Contrast  09/29/2014   CLINICAL DATA:  Lower abdominal pain for 1 month, question inguinal hernia, history hypertension, peptic ulcer disease, hypercholesterolemia, former smoker, BPH  EXAM: CT ABDOMEN AND PELVIS WITH CONTRAST  TECHNIQUE: Multidetector CT imaging of the abdomen and pelvis was performed using the standard protocol following bolus administration of intravenous contrast. Sagittal and coronal MPR images reconstructed from axial data set.  CONTRAST:  OMNIPAQUE IOHEXOL 300 MG/ML SOLN IV. No oral contrast administered.  COMPARISON:  08/15/2014 ; correlation also made with earlier CT exams of 07/21/2013 and 07/28/2004  FINDINGS: Lung bases appear hyperinflated but clear.  Mild pleural calcification at RIGHT lung base.  Large cyst anterior mid LEFT kidney 7.3 x 5.6 x 6.4 cm.  Liver, spleen, pancreas, kidneys, and adrenal glands otherwise normal.  Extensive atherosclerotic calcifications aorta, coronary arteries, iliac arteries and abdominal branch vessels.  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  Narrowing of BILATERAL  renal artery origins with mild narrowings at origins of celiac artery and SMA.  18 mm diameter aneurysmal dilatation RIGHT internal iliac artery.  Common iliac arteries measure 16 mm diameter bilaterally.  Prostatic enlargement, gland 6.5 x 5.6 x 5.6 cm.  Minimal bladder wall thickening which could represent muscular hypertrophy from chronic outlet obstruction.  No ureteral dilatation or hydronephrosis.  Normal appendix.  LEFT inguinal hernia containing a nonobstructed small bowel loop.  Small RIGHT inguinal hernia containing fat.  Stomach and bowel loops otherwise normal appearance for exam limited by lack of oral contrast.  No mass, adenopathy, free fluid, or free air.  Bones demineralized with degenerative disc and facet disease changes of the lumbar spine.  Subtle lucent foci within RIGHT iliac wing identified, with cortical thinning at largest lesion which measures 18 mm in greatest dimension axial image 46 ; these were also present however on 08/15/2014, 07/21/2013, and 07/28/2004.  Subchondral cystic lesion at LEFT femoral head/neck unchanged.  IMPRESSION: LEFT inguinal hernia containing a nonobstructed small bowel loop.  RIGHT inguinal hernia containing fat.  Large LEFT renal cyst.  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  Significant prostatic enlargement.  Lucent foci in RIGHT iliac wing, nonspecific but long-standing.   Electronically Signed   By: Ulyses Southward M.D.   On: 09/29/2014 16:52     EKG Interpretation None      MDM   Final diagnoses:  Fall  Abdominal pain  Inguinal hernia, left  Minor head injury, initial encounter    Pt presenting with c/o abdominal pain after fall- he has hx of constipation as well as known left inguinal hernia- CT scan does not show any traumatic injury, but does  show inguinal hernia- no signs of incarceration.  No acute findings on head CT.  At time of discharge patient is stating he is hungry and would like to go home to eat.  Discharged with strict return precautions.  Pt agreeable with plan.    Ethelda Chick, MD 09/30/14 [phone]

## 2014-09-29 NOTE — ED Notes (Addendum)
Pt. Reports he just went to the restroom in brief, refused to change brief. States he will let us know when he needs to go again.

## 2014-09-29 NOTE — ED Notes (Signed)
Pt. Reports fell in bathroom yesterday, reports LOC. States he hit his head but denies head pain, no deformity or bruising noted. Pt. Only complaint is mid abdominal pain, tender to palpation. Bowel sounds active.

## 2014-12-02 ENCOUNTER — Emergency Department (HOSPITAL_COMMUNITY)
Admission: EM | Admit: 2014-12-02 | Discharge: 2014-12-02 | Discharge status: Against Medical Advice | Payer: Medicare Other | Attending: Emergency Medicine | Admitting: Emergency Medicine

## 2014-12-02 ENCOUNTER — Encounter (HOSPITAL_COMMUNITY): Payer: Self-pay | Admitting: *Deleted

## 2014-12-02 DIAGNOSIS — I10 Essential (primary) hypertension: Secondary | ICD-10-CM | POA: Insufficient documentation

## 2014-12-02 DIAGNOSIS — R1030 Lower abdominal pain, unspecified: Secondary | ICD-10-CM | POA: Diagnosis present

## 2014-12-02 DIAGNOSIS — R1110 Vomiting, unspecified: Secondary | ICD-10-CM | POA: Insufficient documentation

## 2014-12-02 LAB — CBC WITH DIFFERENTIAL/PLATELET
Basophils Absolute: 0 10*3/uL (ref 0.0–0.1)
Basophils Relative: 0 % (ref 0–1)
Eosinophils Absolute: 0.1 10*3/uL (ref 0.0–0.7)
Eosinophils Relative: 1 % (ref 0–5)
HCT: 41.6 % (ref 39.0–52.0)
Hemoglobin: 13.8 g/dL (ref 13.0–17.0)
Lymphocytes Relative: 14 % (ref 12–46)
Lymphs Abs: 1.3 10*3/uL (ref 0.7–4.0)
MCH: 27.8 pg (ref 26.0–34.0)
MCHC: 33.2 g/dL (ref 30.0–36.0)
MCV: 83.9 fL (ref 78.0–100.0)
Monocytes Absolute: 0.5 10*3/uL (ref 0.1–1.0)
Monocytes Relative: 6 % (ref 3–12)
NEUTROS ABS: 7.2 10*3/uL (ref 1.7–7.7)
Neutrophils Relative %: 79 % — ABNORMAL HIGH (ref 43–77)
PLATELETS: 279 10*3/uL (ref 150–400)
RBC: 4.96 MIL/uL (ref 4.22–5.81)
RDW: 13.3 % (ref 11.5–15.5)
WBC: 9 10*3/uL (ref 4.0–10.5)

## 2014-12-02 LAB — COMPREHENSIVE METABOLIC PANEL
ALK PHOS: 133 U/L — AB (ref 39–117)
ALT: 13 U/L (ref 0–53)
AST: 18 U/L (ref 0–37)
Albumin: 3.8 g/dL (ref 3.5–5.2)
Anion gap: 9 (ref 5–15)
BUN: 18 mg/dL (ref 6–23)
CALCIUM: 9.2 mg/dL (ref 8.4–10.5)
CO2: 29 mmol/L (ref 19–32)
Chloride: 99 mmol/L (ref 96–112)
Creatinine, Ser: 1.18 mg/dL (ref 0.50–1.35)
GFR calc Af Amer: 61 mL/min — ABNORMAL LOW (ref >90)
GFR, EST NON AFRICAN AMERICAN: 52 mL/min — AB (ref >90)
Glucose, Bld: 114 mg/dL — ABNORMAL HIGH (ref 70–99)
Potassium: 4.2 mmol/L (ref 3.5–5.1)
Sodium: 137 mmol/L (ref 135–145)
TOTAL PROTEIN: 7.4 g/dL (ref 6.0–8.3)
Total Bilirubin: 0.8 mg/dL (ref 0.3–1.2)

## 2014-12-02 LAB — LIPASE, BLOOD: LIPASE: 35 U/L (ref 11–59)

## 2014-12-02 MED ORDER — ONDANSETRON 4 MG PO TBDP
8.0000 mg | ORAL_TABLET | Freq: Once | ORAL | Status: AC
  Administered 2014-12-02: 8 mg via ORAL
  Filled 2014-12-02: qty 2

## 2014-12-02 NOTE — ED Notes (Signed)
Pt states that he has had lower abdominal pain and 1 episode of vomiting this morning. Pt states that he has been constipated for 3 days as well.

## 2014-12-02 NOTE — ED Notes (Signed)
Pt sts he is leaving.  Told pt that there is an available room for him now, sts he is still leaving.  Pt A/O and in NAD

## 2015-01-31 ENCOUNTER — Emergency Department (HOSPITAL_COMMUNITY)
Admission: EM | Admit: 2015-01-31 | Discharge: 2015-01-31 | Disposition: A | Discharge status: Home / Self Care | Payer: Medicare Other | Source: Home / Self Care | Attending: Emergency Medicine | Admitting: Emergency Medicine

## 2015-01-31 ENCOUNTER — Encounter (HOSPITAL_COMMUNITY): Payer: Self-pay | Admitting: *Deleted

## 2015-01-31 DIAGNOSIS — S61210A Laceration without foreign body of right index finger without damage to nail, initial encounter: Secondary | ICD-10-CM | POA: Diagnosis not present

## 2015-01-31 MED ORDER — TETANUS-DIPHTH-ACELL PERTUSSIS 5-2.5-18.5 LF-MCG/0.5 IM SUSP
INTRAMUSCULAR | Status: AC
  Filled 2015-01-31: qty 0.5

## 2015-01-31 MED ORDER — TETANUS-DIPHTH-ACELL PERTUSSIS 5-2.5-18.5 LF-MCG/0.5 IM SUSP: 0.5000 mL | Freq: Once | INTRAMUSCULAR | Status: DC

## 2015-01-31 NOTE — Discharge Instructions (Signed)

## 2015-01-31 NOTE — ED Notes (Signed)
Lac  To  r   Pointer  Finger  Today  Sustained   With  A  Razor  Blade          Bleeding  Subsided          Unknown  As  To  Last  Tetanus  Status

## 2015-01-31 NOTE — ED Provider Notes (Addendum)
CSN: [id number]     Arrival date & time 01/31/15  1547 History   First MD Initiated Contact with Patient 01/31/15 1720     Chief Complaint  Patient presents with  . Extremity Laceration   (Consider location/radiation/quality/duration/timing/severity/associated sxs/prior Treatment) HPI  He is a 81 year old man here for right index finger laceration. He was using a razor blade to cut a belt when he cut his the tip of his right finger. He ran some cold water over it and came to urgent care. He does not know when his last tetanus shot was. It is throbbing.  Past Medical History  Diagnosis Date  . Hypertension   . Hypercholesterolemia   . ED (erectile dysfunction)   . Urinary frequency     bladder instablity and outlet obstruction   . Constipation   . PUD (peptic ulcer disease)     history of   . BPH (benign prostatic hyperplasia)    Past Surgical History  Procedure Laterality Date  . Ankle surgery Right    History reviewed. No pertinent family history. History  Substance Use Topics  . Smoking status: Former Games developermoker  . Smokeless tobacco: Not on file  . Alcohol Use: No    Review of Systems As in history of present illness Allergies  Penicillins  Home Medications   Prior to Admission medications   Medication Sig Start Date End Date Taking? Authorizing Provider  bisacodyl (DULCOLAX) 5 MG EC tablet 1 tab q hs Prn constipation Patient not taking: Reported on 09/29/2014 09/23/13   Hayden Rasmussenavid Mabe, NP  traMADol (ULTRAM) 50 MG tablet Take 0.5-1 tablets (25-50 mg total) by mouth every 6 (six) hours as needed for severe pain. 09/29/14   Pricilla LovelessScott Goldston, MD   BP 204/92 mmHg  Pulse 67  Temp(Src) 98.6 F (37 C) (Oral)  Resp 16  SpO2 98% Physical Exam  Constitutional: He is oriented to person, place, and time. He appears well-developed and well-nourished. No distress.  Cardiovascular: Normal rate.   Pulmonary/Chest: Effort normal.  Neurological: He is alert and oriented to person, place,  and time.  Skin:  Right index finger with flap laceration to pad of finger. No nail involvement.    ED Course  LACERATION REPAIR Date/Time: 01/31/2015 6:14 PM Performed by: Charm RingsHONIG, Boen Sterbenz J Authorized by: Charm RingsHONIG, Yulanda Diggs J Consent: Verbal consent obtained. Risks and benefits: risks, benefits and alternatives were discussed Consent given by: patient Patient understanding: patient states understanding of the procedure being performed Patient identity confirmed: verbally with patient Time out: Immediately prior to procedure a "time out" was called to verify the correct patient, procedure, equipment, support staff and site/side marked as required. Body area: upper extremity Location details: right index finger Laceration length: 2 cm Tendon involvement: none Nerve involvement: none Anesthesia: local infiltration and digital block Local anesthetic: lidocaine 2% without epinephrine Anesthetic total: 3 ml Irrigation solution: saline Irrigation method: jet lavage Amount of cleaning: standard Skin closure: 4-0 Prolene Number of sutures: One horizontal mattress and a running suture. Approximation: close Approximation difficulty: simple Dressing: antibiotic ointment and 4x4 sterile gauze Patient tolerance: Patient tolerated the procedure well with no immediate complications   (including critical care time) Labs Review Labs Reviewed - No data to display  Imaging Review No results found.   MDM   1. Laceration of right index finger w/o foreign body w/o damage to nail, initial encounter    Wound care as in after visit summary. Follow-up in one week for suture removal.  TDaP given.  Denny PeonErin  Chip BoerJ Jaxson Keener, MD 01/31/15 1815  Charm RingsErin J Iaan Oregel, MD 02/05/15 [phone]

## 2015-02-01 MED ORDER — TETANUS-DIPHTH-ACELL PERTUSSIS 5-2.5-18.5 LF-MCG/0.5 IM SUSP
0.5000 mL | Freq: Once | INTRAMUSCULAR | Status: AC
  Administered 2015-02-01: 0.5 mL via INTRAMUSCULAR

## 2015-02-01 NOTE — ED Notes (Signed)
Tdap was given 01/31/2015 and was charted 02/01/2015 by AUpright, RMA.

## 2015-02-09 ENCOUNTER — Encounter (HOSPITAL_COMMUNITY): Payer: Self-pay | Admitting: Emergency Medicine

## 2015-02-09 ENCOUNTER — Emergency Department (INDEPENDENT_AMBULATORY_CARE_PROVIDER_SITE_OTHER)
Admission: EM | Admit: 2015-02-09 | Discharge: 2015-02-09 | Disposition: A | Discharge status: Home / Self Care | Payer: Medicare Other | Source: Home / Self Care | Attending: Family Medicine | Admitting: Family Medicine

## 2015-02-09 DIAGNOSIS — S61219D Laceration without foreign body of unspecified finger without damage to nail, subsequent encounter: Secondary | ICD-10-CM | POA: Diagnosis not present

## 2015-02-09 DIAGNOSIS — Z4802 Encounter for removal of sutures: Secondary | ICD-10-CM | POA: Diagnosis not present

## 2015-02-09 NOTE — Discharge Instructions (Signed)
Thank you for coming in today. ° ° °Scar Minimization °You will have a scar anytime you have surgery and a cut is made in the skin or you have something removed from your skin (mole, skin cancer, cyst). Although scars are unavoidable following surgery, there are ways to minimize their appearance. °It is important to follow all the instructions you receive from your caregiver about wound care. How your wound heals will influence the appearance of your scar. If you do not follow the wound care instructions as directed, complications such as infection may occur. Wound instructions include keeping the wound clean, moist, and not letting the wound form a scab. Some people form scars that are raised and lumpy (hypertrophic) or larger than the initial wound (keloidal). °HOME CARE INSTRUCTIONS  °· Follow wound care instructions as directed. °· Keep the wound clean by washing it with soap and water. °· Keep the wound moist with provided antibiotic cream or petroleum jelly until completely healed. Moisten twice a day for about 2 weeks. °· Get stitches (sutures) taken out at the scheduled time. °· Avoid touching or manipulating your wound unless needed. Wash your hands thoroughly before and after touching your wound. °· Follow all restrictions such as limits on exercise or work. This depends on where your scar is located. °· Keep the scar protected from sunburn. Cover the scar with sunscreen/sunblock with SPF 30 or higher. °· Gently massage the scar using a circular motion to help minimize the appearance of the scar. Do this only after the wound has closed and all the sutures have been removed. °· For hypertrophic or keloidal scars, there are several ways to treat and minimize their appearance. Methods include compression therapy, intralesional corticosteroids, laser therapy, or surgery. These methods are performed by your caregiver. °Remember that the scar may appear lighter or darker than your normal skin color. This  difference in color should even out with time. °SEEK MEDICAL CARE IF:  °· You have a fever. °· You develop signs of infection such as pain, redness, pus, and warmth. °· You have questions or concerns. °Document Released: 02/23/2010 Document Revised: 11/28/2011 Document Reviewed: 02/23/2010 °ExitCare® Patient Information ©2015 [employer]. This information is not intended to replace advice given to you by your health care provider. Make sure you discuss any questions you have with your health care provider. ° °

## 2015-02-09 NOTE — ED Notes (Signed)
Pt here for suture removal and wound check for finger laceration

## 2015-02-09 NOTE — ED Provider Notes (Signed)
Luis Norton is a 81 y.o. male who presents to Urgent Care today for suture removal. Patient had  stitches placed in his right index finger on the 14th. Hears it today for suture removal. He notes the finger is quite tender. No discharge fevers or chills.   Past Medical History  Diagnosis Date  . Hypertension   . Hypercholesterolemia   . ED (erectile dysfunction)   . Urinary frequency     bladder instablity and outlet obstruction   . Constipation   . PUD (peptic ulcer disease)     history of   . BPH (benign prostatic hyperplasia)    Past Surgical History  Procedure Laterality Date  . Ankle surgery Right    History  Substance Use Topics  . Smoking status: Former Games developermoker  . Smokeless tobacco: Not on file  . Alcohol Use: No   ROS as above Medications: No current facility-administered medications for this encounter.   Current Outpatient Prescriptions  Medication Sig Dispense Refill  . bisacodyl (DULCOLAX) 5 MG EC tablet 1 tab q hs Prn constipation (Patient not taking: Reported on 09/29/2014) 14 tablet 0  . traMADol (ULTRAM) 50 MG tablet Take 0.5-1 tablets (25-50 mg total) by mouth every 6 (six) hours as needed for severe pain. 15 tablet 0   Allergies  Allergen Reactions  . Penicillins Swelling     Exam:  BP 168/79 mmHg  Pulse 69  Temp(Src) 98.9 F (37.2 C) (Oral)  Resp 16  SpO2 95% Gen: Well NAD Right index finger well-appearing laceration with one horizontal mattress and running sutures present. No as charge. Tender to touch. No erythema.  Sutures removed. Dressing applied No results found for this or any previous visit (from the past 24 hour(s)). No results found.  Assessment and Plan: 81 y.o. male with well-appearing laceration. Hypersensitivity within normal limits for a finger tip laceration. Return as needed.  Discussed warning signs or symptoms. Please see discharge instructions. Patient expresses understanding.     Rodolph BongEvan S Suraj Ramdass, MD 02/09/15 [phone]

## 2015-05-16 ENCOUNTER — Emergency Department (HOSPITAL_COMMUNITY): Payer: Medicare Other

## 2015-05-16 ENCOUNTER — Observation Stay (HOSPITAL_COMMUNITY)
Admission: EM | Admit: 2015-05-16 | Discharge: 2015-05-22 | Disposition: A | Discharge status: Home / Self Care | Payer: Medicare Other | Attending: Internal Medicine | Admitting: Internal Medicine

## 2015-05-16 ENCOUNTER — Encounter (HOSPITAL_COMMUNITY): Payer: Self-pay | Admitting: Nurse Practitioner

## 2015-05-16 DIAGNOSIS — E780 Pure hypercholesterolemia: Secondary | ICD-10-CM | POA: Insufficient documentation

## 2015-05-16 DIAGNOSIS — R1030 Lower abdominal pain, unspecified: Secondary | ICD-10-CM | POA: Diagnosis present

## 2015-05-16 DIAGNOSIS — I4891 Unspecified atrial fibrillation: Secondary | ICD-10-CM | POA: Diagnosis not present

## 2015-05-16 DIAGNOSIS — R260 Ataxic gait: Secondary | ICD-10-CM | POA: Insufficient documentation

## 2015-05-16 DIAGNOSIS — Z79899 Other long term (current) drug therapy: Secondary | ICD-10-CM | POA: Insufficient documentation

## 2015-05-16 DIAGNOSIS — K4000 Bilateral inguinal hernia, with obstruction, without gangrene, not specified as recurrent: Secondary | ICD-10-CM | POA: Diagnosis not present

## 2015-05-16 DIAGNOSIS — N400 Enlarged prostate without lower urinary tract symptoms: Secondary | ICD-10-CM | POA: Diagnosis not present

## 2015-05-16 DIAGNOSIS — Z8711 Personal history of peptic ulcer disease: Secondary | ICD-10-CM | POA: Insufficient documentation

## 2015-05-16 DIAGNOSIS — I429 Cardiomyopathy, unspecified: Secondary | ICD-10-CM | POA: Diagnosis not present

## 2015-05-16 DIAGNOSIS — Z87891 Personal history of nicotine dependence: Secondary | ICD-10-CM | POA: Diagnosis not present

## 2015-05-16 DIAGNOSIS — K4090 Unilateral inguinal hernia, without obstruction or gangrene, not specified as recurrent: Secondary | ICD-10-CM

## 2015-05-16 DIAGNOSIS — I10 Essential (primary) hypertension: Secondary | ICD-10-CM | POA: Diagnosis present

## 2015-05-16 DIAGNOSIS — K4020 Bilateral inguinal hernia, without obstruction or gangrene, not specified as recurrent: Secondary | ICD-10-CM

## 2015-05-16 HISTORY — DX: Unspecified atrial fibrillation: I48.91

## 2015-05-16 LAB — COMPREHENSIVE METABOLIC PANEL
ALT: 14 U/L — ABNORMAL LOW (ref 17–63)
ANION GAP: 8 (ref 5–15)
AST: 20 U/L (ref 15–41)
Albumin: 3.2 g/dL — ABNORMAL LOW (ref 3.5–5.0)
Alkaline Phosphatase: 92 U/L (ref 38–126)
BILIRUBIN TOTAL: 0.6 mg/dL (ref 0.3–1.2)
BUN: 35 mg/dL — ABNORMAL HIGH (ref 6–20)
CALCIUM: 9.5 mg/dL (ref 8.9–10.3)
CO2: 27 mmol/L (ref 22–32)
Chloride: 100 mmol/L — ABNORMAL LOW (ref 101–111)
Creatinine, Ser: 1.24 mg/dL (ref 0.61–1.24)
GFR calc non Af Amer: 49 mL/min — ABNORMAL LOW (ref >60)
GFR, EST AFRICAN AMERICAN: 57 mL/min — AB (ref >60)
Glucose, Bld: 127 mg/dL — ABNORMAL HIGH (ref 65–99)
Potassium: 5.1 mmol/L (ref 3.5–5.1)
SODIUM: 135 mmol/L (ref 135–145)
TOTAL PROTEIN: 6.9 g/dL (ref 6.5–8.1)

## 2015-05-16 LAB — CBC
HEMATOCRIT: 36.3 % — AB (ref 39.0–52.0)
HEMOGLOBIN: 12.2 g/dL — AB (ref 13.0–17.0)
MCH: 27.4 pg (ref 26.0–34.0)
MCHC: 33.6 g/dL (ref 30.0–36.0)
MCV: 81.6 fL (ref 78.0–100.0)
Platelets: 367 10*3/uL (ref 150–400)
RBC: 4.45 MIL/uL (ref 4.22–5.81)
RDW: 13.2 % (ref 11.5–15.5)
WBC: 10.1 10*3/uL (ref 4.0–10.5)

## 2015-05-16 LAB — LACTIC ACID, PLASMA
LACTIC ACID, VENOUS: 1.5 mmol/L (ref 0.5–2.0)
LACTIC ACID, VENOUS: 0.9 mmol/L (ref 0.5–2.0)

## 2015-05-16 LAB — MAGNESIUM: Magnesium: 1.9 mg/dL (ref 1.7–2.4)

## 2015-05-16 LAB — PROTIME-INR
INR: 1.08 (ref 0.00–1.49)
PROTHROMBIN TIME: 14.2 s (ref 11.6–15.2)

## 2015-05-16 LAB — TROPONIN I: Troponin I: 0.03 ng/mL (ref <0.031)

## 2015-05-16 MED ORDER — METOPROLOL SUCCINATE ER 25 MG PO TB24
25.0000 mg | ORAL_TABLET | Freq: Every day | ORAL | Status: DC
  Administered 2015-05-17 – 2015-05-22 (×6): 25 mg via ORAL
  Filled 2015-05-16 (×6): qty 1

## 2015-05-16 MED ORDER — SODIUM CHLORIDE 0.9 % IV SOLN
Freq: Once | INTRAVENOUS | Status: AC
  Administered 2015-05-16: 17:00:00 via INTRAVENOUS

## 2015-05-16 MED ORDER — ONDANSETRON HCL 4 MG/2ML IJ SOLN
4.0000 mg | Freq: Once | INTRAMUSCULAR | Status: AC
  Administered 2015-05-16: 4 mg via INTRAVENOUS
  Filled 2015-05-16: qty 2

## 2015-05-16 MED ORDER — SODIUM CHLORIDE 0.9 % IV BOLUS (SEPSIS)
500.0000 mL | Freq: Once | INTRAVENOUS | Status: AC
  Administered 2015-05-16: 500 mL via INTRAVENOUS

## 2015-05-16 MED ORDER — IOHEXOL 300 MG/ML  SOLN
100.0000 mL | Freq: Once | INTRAMUSCULAR | Status: AC | PRN
  Administered 2015-05-16: 100 mL via INTRAVENOUS

## 2015-05-16 MED ORDER — ENOXAPARIN SODIUM 40 MG/0.4ML ~~LOC~~ SOLN
40.0000 mg | SUBCUTANEOUS | Status: DC
  Administered 2015-05-17: 40 mg via SUBCUTANEOUS
  Filled 2015-05-16 (×2): qty 0.4

## 2015-05-16 MED ORDER — MAGNESIUM SULFATE 2 GM/50ML IV SOLN
2.0000 g | Freq: Once | INTRAVENOUS | Status: AC
  Administered 2015-05-16: 2 g via INTRAVENOUS
  Filled 2015-05-16: qty 50

## 2015-05-16 MED ORDER — HYDROMORPHONE HCL 1 MG/ML IJ SOLN
0.5000 mg | Freq: Once | INTRAMUSCULAR | Status: AC
  Administered 2015-05-16: 0.5 mg via INTRAVENOUS
  Filled 2015-05-16: qty 1

## 2015-05-16 MED ORDER — SODIUM CHLORIDE 0.9 % IJ SOLN
3.0000 mL | Freq: Two times a day (BID) | INTRAMUSCULAR | Status: DC
  Administered 2015-05-20 – 2015-05-21 (×2): 3 mL via INTRAVENOUS

## 2015-05-16 MED ORDER — TRAMADOL HCL 50 MG PO TABS
25.0000 mg | ORAL_TABLET | Freq: Four times a day (QID) | ORAL | Status: DC | PRN
  Administered 2015-05-17 – 2015-05-21 (×11): 50 mg via ORAL
  Filled 2015-05-16 (×13): qty 1

## 2015-05-16 MED ORDER — ONDANSETRON HCL 4 MG/2ML IJ SOLN: 4.0000 mg | Freq: Four times a day (QID) | INTRAMUSCULAR | Status: DC | PRN

## 2015-05-16 MED ORDER — IOHEXOL 300 MG/ML  SOLN
25.0000 mL | Freq: Once | INTRAMUSCULAR | Status: DC | PRN
Start: 2015-05-16 — End: 2015-05-22

## 2015-05-16 MED ORDER — METOPROLOL TARTRATE 1 MG/ML IV SOLN
2.5000 mg | Freq: Once | INTRAVENOUS | Status: AC
  Administered 2015-05-16: 2.5 mg via INTRAVENOUS
  Filled 2015-05-16: qty 5

## 2015-05-16 MED ORDER — ONDANSETRON HCL 4 MG PO TABS: 4.0000 mg | ORAL_TABLET | Freq: Four times a day (QID) | ORAL | Status: DC | PRN

## 2015-05-16 MED ORDER — ASPIRIN EC 81 MG PO TBEC
81.0000 mg | DELAYED_RELEASE_TABLET | Freq: Every day | ORAL | Status: DC
  Administered 2015-05-17 – 2015-05-22 (×6): 81 mg via ORAL
  Filled 2015-05-16 (×6): qty 1

## 2015-05-16 NOTE — ED Notes (Signed)
Pt also reporting a large knot to groin area. Pt sts "it has been there a long time. "

## 2015-05-16 NOTE — ED Provider Notes (Signed)
CSN: [id number]     Arrival date & time 05/16/15  1114 History   First MD Initiated Contact with Patient 05/16/15 1455     Chief Complaint  Patient presents with  . Abdominal Pain     Patient is a 81 y.o. male presenting with abdominal pain. The history is provided by the patient. No language interpreter was used.  Abdominal Pain  Luis Norton presents for evaluation of abdominal pain. He reports that he has had a long time of abdominal pain and groin swelling. For the last 4-5 days he's had increasing lower abdominal pain as well as a significant increase in amount of swelling timing in his groin. He reports vomiting with every meal and constipation. His last bowel movement was 3 days ago. He states that he seen a surgeon for his hernia previously and he was told that he was too weak to have surgery done. He denies any medical problems. He states that yesterday he saw his doctor and they were able to push the hernia back in. He states usually he can push the hernia back in.  Past Medical History  Diagnosis Date  . Hypertension   . Hypercholesterolemia   . ED (erectile dysfunction)   . Urinary frequency     bladder instablity and outlet obstruction   . Constipation   . PUD (peptic ulcer disease)     history of   . BPH (benign prostatic hyperplasia)   . Atrial fibrillation    Past Surgical History  Procedure Laterality Date  . Ankle surgery Right    History reviewed. No pertinent family history. Social History  Substance Use Topics  . Smoking status: Former Games developer  . Smokeless tobacco: None  . Alcohol Use: No    Review of Systems  Gastrointestinal: Positive for abdominal pain.  All other systems reviewed and are negative.     Allergies  Penicillins  Home Medications   Prior to Admission medications   Medication Sig Start Date End Date Taking? Authorizing Provider  traMADol (ULTRAM) 50 MG tablet Take 0.5-1 tablets (25-50 mg total) by mouth every 6 (six) hours as  needed for severe pain. 09/29/14  Yes Pricilla Loveless, MD  bisacodyl (DULCOLAX) 5 MG EC tablet 1 tab q hs Prn constipation Patient not taking: Reported on 09/29/2014 09/23/13   Hayden Rasmussen, NP   BP 157/83 mmHg  Pulse 77  Temp(Src) 97.7 F (36.5 C) (Oral)  Resp 16  Ht 5\' 6"  (1.676 m)  Wt 130 lb 6.4 oz (59.149 kg)  BMI 21.06 kg/m2  SpO2 96% Physical Exam  Constitutional: He is oriented to person, place, and time. He appears well-developed and well-nourished.  HENT:  Head: Normocephalic and atraumatic.  Cardiovascular: Normal rate and regular rhythm.   No murmur heard. Pulmonary/Chest: Effort normal and breath sounds normal. No respiratory distress.  Abdominal: Soft. Bowel sounds are normal. There is no rebound and no guarding.  Moderate lower abdominal tenderness  Genitourinary:  Large inguinal hernia, right greater than left.  Locally tender to palpation without overlying erythema.  Partially reducible on examination.    Musculoskeletal: He exhibits no edema or tenderness.  Neurological: He is alert and oriented to person, place, and time.  Skin: Skin is warm and dry.  Psychiatric: He has a normal mood and affect. His behavior is normal.  Nursing note and vitals reviewed.   ED Course  Procedures (including critical care time) Labs Review Labs Reviewed  COMPREHENSIVE METABOLIC PANEL - Abnormal; Notable for the following:  Chloride 100 (*)    Glucose, Bld 127 (*)    BUN 35 (*)    Albumin 3.2 (*)    ALT 14 (*)    GFR calc non Af Amer 49 (*)    GFR calc Af Amer 57 (*)    All other components within normal limits  CBC - Abnormal; Notable for the following:    Hemoglobin 12.2 (*)    HCT 36.3 (*)    All other components within normal limits  PROTIME-INR  LACTIC ACID, PLASMA  LACTIC ACID, PLASMA  TROPONIN I  MAGNESIUM  URINALYSIS, ROUTINE W REFLEX MICROSCOPIC (NOT AT [facility])    Imaging Review Dg Chest 2 View  05/16/2015   CLINICAL DATA:  Abdominal pain 4 days. Nausea and  vomiting with constipation.  EXAM: CHEST  2 VIEW  COMPARISON:  03/18/2013 and 03/10/2013  FINDINGS: Lungs are hyperexpanded with flattening of the hemidiaphragms. There is no focal consolidation or effusion. There is borderline cardiomegaly. Calcified plaque is present over the thoracic aorta. Small nodular cluster over the lateral right midlung unchanged likely calcified granulomas. Remainder of the exam is unchanged.  IMPRESSION: No acute cardiopulmonary disease.  COPD.   Electronically Signed   By: Elberta Fortis M.D.   On: 05/16/2015 16:17   Ct Abdomen Pelvis W Contrast  05/16/2015   CLINICAL DATA:  Severe lower abdominal pain for 4 days, constipation, history hypertension, former smoker, peptic ulcer disease  EXAM: CT ABDOMEN AND PELVIS WITH CONTRAST  TECHNIQUE: Multidetector CT imaging of the abdomen and pelvis was performed using the standard protocol following bolus administration of intravenous contrast. Sagittal and coronal MPR images reconstructed from axial data set.  CONTRAST:  OMNIPAQUE IOHEXOL 300 MG/ML SOLN IV. Dilute oral contrast.  COMPARISON:  09/29/2014  FINDINGS: Lung bases emphysematous but clear.  Large cyst arising from anterior aspect of mid LEFT kidney 7.5 x 5.4 x 7.1 cm.  Liver, spleen, pancreas, kidneys, and adrenal glands otherwise normal.  Stomach distended by contrast and food debris.  Appendix, cecum, and terminal ileum located within a large RIGHT inguinal hernia.  Numerous small bowel loops within a large LEFT inguinal hernia.  Bowel loops otherwise unremarkable without evidence of bowel obstruction.  Extensive atherosclerotic calcifications.  Marked prostatic enlargement, gland 6.5 x 4.9 x 6.3 cm.  No mass, adenopathy, free air or free fluid.  Osseous demineralization with degenerative disc and facet disease changes of the lumbar spine.  IMPRESSION: Large LEFT renal cyst.  Small bowel loops within large LEFT inguinal hernia without obstruction.  Appendix, cecum and terminal  ileum within large RIGHT inguinal hernia without obstruction.  Marked prostatic enlargement.  Extensive atherosclerotic disease.   Electronically Signed   By: Ulyses Southward M.D.   On: 05/16/2015 17:19   I have personally reviewed and evaluated these images and lab results as part of my medical decision-making.   EKG Interpretation   Date/Time:  Saturday May 16 2015 17:53:20 EDT Ventricular Rate:  142 PR Interval:  106 QRS Duration: 108 QT Interval:  307 QTC Calculation: 472 R Axis:   -69 Text Interpretation:  Supraventricular tachycardia Multiple ventricular  premature complexes LVH with secondary repolarization abnormality Baseline  wander in lead(s) V2 Confirmed by Lincoln Brigham [phone]) on 05/16/2015 6:02:56  PM      MDM   Final diagnoses:  Bilateral inguinal hernia without obstruction or gangrene, recurrence not specified    Patient here for evaluation of lower abdominal pain and progressively increasing hernia. On examination only able  to partially reduce his right-sided inguinal hernia on exam. Discussed with Dr. Carolynne Edouard with general surgery who evaluated the patient in the emergency department and was able to fully reduce his hernia in the emergency department. During his emergency department stay the patient developed SVT with heart rate from 135-150 and multiple PVCs. EKG did have some ischemic changes during SVT episode - patient was asymptomatic at the time. Patient did convert spontaneously with a bout of hiccups prior to administering any medications. Discussed with hospitalist regarding admission.    Tilden Fossa, MD 05/16/15 [phone]

## 2015-05-16 NOTE — ED Notes (Signed)
Transporting patient to new room assignment. 

## 2015-05-16 NOTE — ED Notes (Signed)
He states hes had severe abd pain for past 4 days. He reports a 3-4 weeks history of constipation states he can not recall having a bowel movement for at least 3 weeks. He reports some n/v over past days. He tried laxatives with no relief

## 2015-05-16 NOTE — ED Notes (Signed)
Attempted report 

## 2015-05-16 NOTE — Consult Note (Addendum)
Reason for Consult:hernia Referring Physician: Dr. Hilarie Fredrickson is an 80 y.o. male.  HPI:  The patient is a 81 year old black male who has had known inguinal hernias for years. He has been told he is not a candidate for surgery. He states that he has had intermittent nausea and vomiting a couple times a week for a long time but this has not been a problem for him over the last couple months. He also states that he has problems with constipation. He came to the emergency department because his hernias were larger and more uncomfortable. He underwent CT scans of the abdomen which showed bowel and both hernias but no sign of obstruction. While in the emergency department he also developed A. fib with rapid ventricular response.  Past Medical History  Diagnosis Date  . Hypertension   . Hypercholesterolemia   . ED (erectile dysfunction)   . Urinary frequency     bladder instablity and outlet obstruction   . Constipation   . PUD (peptic ulcer disease)     history of   . BPH (benign prostatic hyperplasia)     Past Surgical History  Procedure Laterality Date  . Ankle surgery Right     History reviewed. No pertinent family history.  Social History:  reports that he has quit smoking. He does not have any smokeless tobacco history on file. He reports that he does not drink alcohol or use illicit drugs.  Allergies:  Allergies  Allergen Reactions  . Penicillins Swelling    Medications: I have reviewed the patient's current medications.  Results for orders placed or performed during the hospital encounter of 05/16/15 (from the past 48 hour(s))  Comprehensive metabolic panel     Status: Abnormal   Collection Time: 05/16/15 11:47 AM  Result Value Ref Range   Sodium 135 135 - 145 mmol/L   Potassium 5.1 3.5 - 5.1 mmol/L   Chloride 100 (L) 101 - 111 mmol/L   CO2 27 22 - 32 mmol/L   Glucose, Bld 127 (H) 65 - 99 mg/dL   BUN 35 (H) 6 - 20 mg/dL   Creatinine, Ser 1.24 0.61 - 1.24 mg/dL    Calcium 9.5 8.9 - 10.3 mg/dL   Total Protein 6.9 6.5 - 8.1 g/dL   Albumin 3.2 (L) 3.5 - 5.0 g/dL   AST 20 15 - 41 U/L   ALT 14 (L) 17 - 63 U/L   Alkaline Phosphatase 92 38 - 126 U/L   Total Bilirubin 0.6 0.3 - 1.2 mg/dL   GFR calc non Af Amer 49 (L) >60 mL/min   GFR calc Af Amer 57 (L) >60 mL/min    Comment: (NOTE) The eGFR has been calculated using the CKD EPI equation. This calculation has not been validated in all clinical situations. eGFR's persistently <60 mL/min signify possible Chronic Kidney Disease.    Anion gap 8 5 - 15  CBC     Status: Abnormal   Collection Time: 05/16/15 11:47 AM  Result Value Ref Range   WBC 10.1 4.0 - 10.5 K/uL   RBC 4.45 4.22 - 5.81 MIL/uL   Hemoglobin 12.2 (L) 13.0 - 17.0 g/dL   HCT 36.3 (L) 39.0 - 52.0 %   MCV 81.6 78.0 - 100.0 fL   MCH 27.4 26.0 - 34.0 pg   MCHC 33.6 30.0 - 36.0 g/dL   RDW 13.2 11.5 - 15.5 %   Platelets 367 150 - 400 K/uL  Protime-INR     Status: None  Collection Time: 05/16/15  3:40 PM  Result Value Ref Range   Prothrombin Time 14.2 11.6 - 15.2 seconds   INR 1.08 0.00 - 1.49  Lactic acid, plasma     Status: None   Collection Time: 05/16/15  3:40 PM  Result Value Ref Range   Lactic Acid, Venous 1.5 0.5 - 2.0 mmol/L    Dg Chest 2 View  05/16/2015   CLINICAL DATA:  Abdominal pain 4 days. Nausea and vomiting with constipation.  EXAM: CHEST  2 VIEW  COMPARISON:  03/18/2013 and 03/10/2013  FINDINGS: Lungs are hyperexpanded with flattening of the hemidiaphragms. There is no focal consolidation or effusion. There is borderline cardiomegaly. Calcified plaque is present over the thoracic aorta. Small nodular cluster over the lateral right midlung unchanged likely calcified granulomas. Remainder of the exam is unchanged.  IMPRESSION: No acute cardiopulmonary disease.  COPD.   Electronically Signed   By: Marin Olp M.D.   On: 05/16/2015 16:17   Ct Abdomen Pelvis W Contrast  05/16/2015   CLINICAL DATA:  Severe lower abdominal  pain for 4 days, constipation, history hypertension, former smoker, peptic ulcer disease  EXAM: CT ABDOMEN AND PELVIS WITH CONTRAST  TECHNIQUE: Multidetector CT imaging of the abdomen and pelvis was performed using the standard protocol following bolus administration of intravenous contrast. Sagittal and coronal MPR images reconstructed from axial data set.  CONTRAST:  152mL OMNIPAQUE IOHEXOL 300 MG/ML SOLN IV. Dilute oral contrast.  COMPARISON:  09/29/2014  FINDINGS: Lung bases emphysematous but clear.  Large cyst arising from anterior aspect of mid LEFT kidney 7.5 x 5.4 x 7.1 cm.  Liver, spleen, pancreas, kidneys, and adrenal glands otherwise normal.  Stomach distended by contrast and food debris.  Appendix, cecum, and terminal ileum located within a large RIGHT inguinal hernia.  Numerous small bowel loops within a large LEFT inguinal hernia.  Bowel loops otherwise unremarkable without evidence of bowel obstruction.  Extensive atherosclerotic calcifications.  Marked prostatic enlargement, gland 6.5 x 4.9 x 6.3 cm.  No mass, adenopathy, free air or free fluid.  Osseous demineralization with degenerative disc and facet disease changes of the lumbar spine.  IMPRESSION: Large LEFT renal cyst.  Small bowel loops within large LEFT inguinal hernia without obstruction.  Appendix, cecum and terminal ileum within large RIGHT inguinal hernia without obstruction.  Marked prostatic enlargement.  Extensive atherosclerotic disease.   Electronically Signed   By: Lavonia Dana M.D.   On: 05/16/2015 17:19    Review of Systems  Constitutional: Negative.   HENT: Negative.   Eyes: Negative.   Respiratory: Negative.   Cardiovascular: Positive for palpitations.  Gastrointestinal: Positive for vomiting and constipation.  Genitourinary: Negative.   Musculoskeletal: Negative.   Skin: Negative.   Neurological: Negative.   Endo/Heme/Allergies: Negative.   Psychiatric/Behavioral: Negative.    Blood pressure 154/77, pulse 68,  temperature 98.8 F (37.1 C), temperature source Oral, resp. rate 18, SpO2 98 %. Physical Exam  Constitutional: He is oriented to person, place, and time. He appears well-developed and well-nourished.  HENT:  Head: Normocephalic and atraumatic.  Eyes: Conjunctivae and EOM are normal. Pupils are equal, round, and reactive to light.  Neck: Normal range of motion. Neck supple.  Cardiovascular:  Tachy and irregular  Respiratory: Effort normal and breath sounds normal.  GI: Soft. Bowel sounds are normal. There is no tenderness.  Genitourinary:  There are large bilateral inguinal hernias that reduce with palpation. There is no tenderness.  Musculoskeletal: Normal range of motion.  Neurological: He is  alert and oriented to person, place, and time.  Skin: Skin is warm and dry.  Psychiatric: He has a normal mood and affect. His behavior is normal.    Assessment/Plan:  The patient has bilateral inguinal hernias that were able to be reduced without much difficulty. There is no sign of obstruction. At this point he will need to be evaluated for his cardiac condition. We will follow him while he is in the hospital. We will ask that medicine and cardiology evaluate him with respect to risk for possible surgery.  TOTH III,PAUL S 05/16/2015, 6:41 PM

## 2015-05-17 ENCOUNTER — Observation Stay (HOSPITAL_COMMUNITY): Payer: Medicare Other

## 2015-05-17 DIAGNOSIS — K4021 Bilateral inguinal hernia, without obstruction or gangrene, recurrent: Secondary | ICD-10-CM | POA: Diagnosis not present

## 2015-05-17 DIAGNOSIS — E780 Pure hypercholesterolemia: Secondary | ICD-10-CM | POA: Diagnosis not present

## 2015-05-17 DIAGNOSIS — I10 Essential (primary) hypertension: Secondary | ICD-10-CM | POA: Diagnosis present

## 2015-05-17 DIAGNOSIS — K4000 Bilateral inguinal hernia, with obstruction, without gangrene, not specified as recurrent: Secondary | ICD-10-CM | POA: Diagnosis not present

## 2015-05-17 DIAGNOSIS — I4891 Unspecified atrial fibrillation: Secondary | ICD-10-CM | POA: Diagnosis not present

## 2015-05-17 LAB — BASIC METABOLIC PANEL
Anion gap: 9 (ref 5–15)
BUN: 21 mg/dL — ABNORMAL HIGH (ref 6–20)
CALCIUM: 9.2 mg/dL (ref 8.9–10.3)
CO2: 27 mmol/L (ref 22–32)
CREATININE: 0.95 mg/dL (ref 0.61–1.24)
Chloride: 102 mmol/L (ref 101–111)
GFR calc Af Amer: >60 mL/min (ref >60)
GFR calc non Af Amer: >60 mL/min (ref >60)
GLUCOSE: 100 mg/dL — AB (ref 65–99)
Potassium: 4.6 mmol/L (ref 3.5–5.1)
Sodium: 138 mmol/L (ref 135–145)

## 2015-05-17 LAB — CBC WITH DIFFERENTIAL/PLATELET
BASOS PCT: 0 % (ref 0–1)
Basophils Absolute: 0 10*3/uL (ref 0.0–0.1)
Eosinophils Absolute: 0.1 10*3/uL (ref 0.0–0.7)
Eosinophils Relative: 1 % (ref 0–5)
HEMATOCRIT: 37.6 % — AB (ref 39.0–52.0)
Hemoglobin: 12.5 g/dL — ABNORMAL LOW (ref 13.0–17.0)
LYMPHS ABS: 1.6 10*3/uL (ref 0.7–4.0)
Lymphocytes Relative: 18 % (ref 12–46)
MCH: 27.6 pg (ref 26.0–34.0)
MCHC: 33.2 g/dL (ref 30.0–36.0)
MCV: 83 fL (ref 78.0–100.0)
MONO ABS: 1.1 10*3/uL — AB (ref 0.1–1.0)
MONOS PCT: 12 % (ref 3–12)
NEUTROS ABS: 6.1 10*3/uL (ref 1.7–7.7)
Neutrophils Relative %: 69 % (ref 43–77)
Platelets: 391 10*3/uL (ref 150–400)
RBC: 4.53 MIL/uL (ref 4.22–5.81)
RDW: 13.4 % (ref 11.5–15.5)
WBC: 8.9 10*3/uL (ref 4.0–10.5)

## 2015-05-17 LAB — URINALYSIS, ROUTINE W REFLEX MICROSCOPIC
Bilirubin Urine: NEGATIVE
Glucose, UA: NEGATIVE mg/dL
Hgb urine dipstick: NEGATIVE
Ketones, ur: NEGATIVE mg/dL
Leukocytes, UA: NEGATIVE
Nitrite: NEGATIVE
Protein, ur: NEGATIVE mg/dL
Specific Gravity, Urine: 1.035 — ABNORMAL HIGH (ref 1.005–1.030)
UROBILINOGEN UA: 0.2 mg/dL (ref 0.0–1.0)
pH: 5 (ref 5.0–8.0)

## 2015-05-17 LAB — LACTIC ACID, PLASMA: Lactic Acid, Venous: 2 mmol/L (ref 0.5–2.0)

## 2015-05-17 MED ORDER — POLYETHYLENE GLYCOL 3350 17 G PO PACK
17.0000 g | PACK | Freq: Every day | ORAL | Status: DC
  Administered 2015-05-17 – 2015-05-22 (×5): 17 g via ORAL
  Filled 2015-05-17 (×5): qty 1

## 2015-05-17 MED ORDER — LACTULOSE 10 GM/15ML PO SOLN
20.0000 g | Freq: Two times a day (BID) | ORAL | Status: DC | PRN
  Administered 2015-05-21: 20 g via ORAL
  Filled 2015-05-17 (×2): qty 30

## 2015-05-17 MED ORDER — MORPHINE SULFATE (PF) 2 MG/ML IV SOLN
1.0000 mg | INTRAVENOUS | Status: DC | PRN
  Administered 2015-05-17 – 2015-05-18 (×2): 1 mg via INTRAVENOUS
  Filled 2015-05-17 (×2): qty 1

## 2015-05-17 NOTE — Progress Notes (Signed)
Patient admitted after midnight, please see H&P.  Echo ordered.  BB Started-- appears to be in sinus.  Patient appears to have poor overall function.  Lives home alone.  Confused/impulsive Will get cardiology consult for pre-op management Marlin Canary

## 2015-05-17 NOTE — Progress Notes (Signed)
CSW c/s noted.  If pt returning home at d/c, please c/s RNCM for HHC/private duty care to assist with ADLs.  If SNF, please re-consult CSW for NHP.  Thanks.  CSW signing off.

## 2015-05-17 NOTE — Progress Notes (Signed)
Subjective: He says he's still a little tender, his hernias remain reduced.    Objective: Vital signs in last 24 hours: Temp:  [97.7 F (36.5 C)-98.8 F (37.1 C)] 98.1 F (36.7 C) (08/28 0556) Pulse Rate:  [63-148] 72 (08/28 0556) Resp:  [14-20] 16 (08/28 0556) BP: (106-160)/(53-86) 158/66 mmHg (08/28 0556) SpO2:  [93 %-100 %] 99 % (08/28 0556) Weight:  [59.149 kg (130 lb 6.4 oz)] 59.149 kg (130 lb 6.4 oz) (08/27 2225)  afebrile, VSS No labs this AM Intake/Output from previous day: 08/27 0701 - 08/28 0700 In: 500 [I.V.:500] Out: 300 [Urine:300] Intake/Output this shift:    General appearance: alert, cooperative and he is a bit confused, and say his abdome is tender.  Both hernias remain reduced. GI: soft, no distension but some tenderness.  Hernias reduced.  + bS  Lab Results:   Recent Labs  05/16/15 1147  WBC 10.1  HGB 12.2*  HCT 36.3*  PLT 367    BMET  Recent Labs  05/16/15 1147  NA 135  K 5.1  CL 100*  CO2 27  GLUCOSE 127*  BUN 35*  CREATININE 1.24  CALCIUM 9.5   PT/INR  Recent Labs  05/16/15 1540  LABPROT 14.2  INR 1.08     Recent Labs Lab 05/16/15 1147  AST 20  ALT 14*  ALKPHOS 92  BILITOT 0.6  PROT 6.9  ALBUMIN 3.2*     Lipase     Component Value Date/Time   LIPASE 35 12/02/2014 1435     Studies/Results: Dg Chest 2 View  05/16/2015   CLINICAL DATA:  Abdominal pain 4 days. Nausea and vomiting with constipation.  EXAM: CHEST  2 VIEW  COMPARISON:  03/18/2013 and 03/10/2013  FINDINGS: Lungs are hyperexpanded with flattening of the hemidiaphragms. There is no focal consolidation or effusion. There is borderline cardiomegaly. Calcified plaque is present over the thoracic aorta. Small nodular cluster over the lateral right midlung unchanged likely calcified granulomas. Remainder of the exam is unchanged.  IMPRESSION: No acute cardiopulmonary disease.  COPD.   Electronically Signed   By: Elberta Fortis M.D.   On: 05/16/2015 16:17   Ct  Abdomen Pelvis W Contrast  05/16/2015   CLINICAL DATA:  Severe lower abdominal pain for 4 days, constipation, history hypertension, former smoker, peptic ulcer disease  EXAM: CT ABDOMEN AND PELVIS WITH CONTRAST  TECHNIQUE: Multidetector CT imaging of the abdomen and pelvis was performed using the standard protocol following bolus administration of intravenous contrast. Sagittal and coronal MPR images reconstructed from axial data set.  CONTRAST:  OMNIPAQUE IOHEXOL 300 MG/ML SOLN IV. Dilute oral contrast.  COMPARISON:  09/29/2014  FINDINGS: Lung bases emphysematous but clear.  Large cyst arising from anterior aspect of mid LEFT kidney 7.5 x 5.4 x 7.1 cm.  Liver, spleen, pancreas, kidneys, and adrenal glands otherwise normal.  Stomach distended by contrast and food debris.  Appendix, cecum, and terminal ileum located within a large RIGHT inguinal hernia.  Numerous small bowel loops within a large LEFT inguinal hernia.  Bowel loops otherwise unremarkable without evidence of bowel obstruction.  Extensive atherosclerotic calcifications.  Marked prostatic enlargement, gland 6.5 x 4.9 x 6.3 cm.  No mass, adenopathy, free air or free fluid.  Osseous demineralization with degenerative disc and facet disease changes of the lumbar spine.  IMPRESSION: Large LEFT renal cyst.  Small bowel loops within large LEFT inguinal hernia without obstruction.  Appendix, cecum and terminal ileum within large RIGHT inguinal hernia without obstruction.  Marked  prostatic enlargement.  Extensive atherosclerotic disease.   Electronically Signed   By: Ulyses Southward M.D.   On: 05/16/2015 17:19    Medications: . aspirin EC  81 mg Oral Daily  . enoxaparin (LOVENOX) injection  40 mg Subcutaneous Q24H  . metoprolol succinate  25 mg Oral Daily  . sodium chloride  3 mL Intravenous Q12H    Assessment/Plan Reducible bilateral hernias; reduced in ED without difficulty AF with RVR Hypertension BPH Hypertension   Plan:  Seen with Dr.  Donell Beers, will recheck labs and film this AM       Luis Norton 05/17/2015

## 2015-05-17 NOTE — Progress Notes (Signed)
Pt still c/o of abdominal pain, MD paged, new orders received.

## 2015-05-17 NOTE — H&P (Signed)
[facility] History and Physical  Luis Norton ZOX:[id number] DOB: Jan 25, 1922 DOA: 05/16/2015  Referring physician: Tilden Fossa, MD PCP: Katy Apo, MD   Chief Complaint: Abdominal pain  HPI: Luis Norton is a 81 y.o. male with a past medical history of atrial fibrillation, BPH, hypertension, hyperlipidemia, erectile dysfunction, constipation, PUD who came into the emergency department due to outflow chief complaint. Patient has a history of bilateral inguinal hernias which occasionally given pain. He says that on this episode for the last 5 days or so he has been having increasing pain and swelling/bulging in his groin area. He complains of constipation for the last 3 days and also reports nausea and emesis which is triggered often by oral intake. He says that just today he saw his primary care provider and more able to push the hernia back in, but returns today with the pain, groin bulging and inability to push the hernia back in.  During his stay in the emergency department he had rapid ventricular response of atrial fibrillation with pulse in the 140s and 150s. His rate has been subsequently controlled. General surgery was consulted and the hernia was able to be pushed back in. He is currently in no acute distress and states that his pain is a lot better now.   Review of Systems:  Constitutional:  No weight loss, night sweats, Fevers, chills, fatigue.  HEENT:  No headaches, Difficulty swallowing,Tooth/dental problems,Sore throat,  No sneezing, itching, ear ache, nasal congestion, post nasal drip,  Cardio-vascular:  No chest pain, Orthopnea, PND, swelling in lower extremities, anasarca, dizziness, palpitations  GI:  No heartburn, indigestion, abdominal pain, nausea, vomiting, diarrhea, change in bowel habits, loss of appetite  Resp:  No shortness of breath with exertion or at rest. No excess mucus, no productive cough, No non-productive cough, No coughing up of blood.No  change in color of mucus.No wheezing.No chest wall deformity  Skin:  no rash or lesions.  GU:  no dysuria, change in color of urine, no urgency or frequency. No flank pain.  Musculoskeletal:  No joint pain or swelling. No decreased range of motion. No back pain.  Psych:  No change in mood or affect. No depression or anxiety. No memory loss.   Past Medical History  Diagnosis Date  . Hypertension   . Hypercholesterolemia   . ED (erectile dysfunction)   . Urinary frequency     bladder instablity and outlet obstruction   . Constipation   . PUD (peptic ulcer disease)     history of   . BPH (benign prostatic hyperplasia)   . Atrial fibrillation    Past Surgical History  Procedure Laterality Date  . Ankle surgery Right    Social History:  reports that he has quit smoking. He does not have any smokeless tobacco history on file. He reports that he does not drink alcohol or use illicit drugs.  Allergies  Allergen Reactions  . Penicillins Swelling    History reviewed. No pertinent family history.    Prior to Admission medications   Medication Sig Start Date End Date Taking? Authorizing Provider  traMADol (ULTRAM) 50 MG tablet Take 0.5-1 tablets (25-50 mg total) by mouth every 6 (six) hours as needed for severe pain. 09/29/14  Yes Pricilla Loveless, MD  bisacodyl (DULCOLAX) 5 MG EC tablet 1 tab q hs Prn constipation Patient not taking: Reported on 09/29/2014 09/23/13   Hayden Rasmussen, NP   Physical Exam: Filed Vitals:   05/16/15 2100 05/16/15 2130 05/16/15 2200 05/16/15 2225  BP: 122/76 126/69 106/53 157/83  Pulse: 73 82 93 77  Temp:    97.7 F (36.5 C)  TempSrc:    Oral  Resp: Height:     (1.676 m)  Weight:    59.149 kg (130 lb 6.4 oz)  SpO2: 94% 93% 97% 96%    Wt Readings from Last 3 Encounters:  05/16/15 59.149 kg (130 lb 6.4 oz)  08/15/14 79.833 kg (176 lb)  07/26/13 79.833 kg (176 lb)    General:  Appears calm and comfortable Eyes: PERRL, normal lids,  irises & conjunctiva ENT: grossly normal hearing, lips & tongue Neck: no LAD, masses or thyromegaly Cardiovascular: RRR, no m/r/g. No LE edema. Telemetry: SR, no arrhythmias  Respiratory: CTA bilaterally, no w/r/r. Normal respiratory effort. Abdomen: soft, ntnd Skin: no rash or induration seen on limited exam Musculoskeletal: grossly normal tone BUE/BLE Psychiatric: grossly normal mood and affect, speech fluent and appropriate Neurologic: grossly non-focal.          Labs on Admission:  Basic Metabolic Panel:  Recent Labs Lab 05/16/15 1147 05/16/15 2103  NA 135  --   K 5.1  --   CL 100*  --   CO2 27  --   GLUCOSE 127*  --   BUN 35*  --   CREATININE 1.24  --   CALCIUM 9.5  --   MG  --  1.9   Liver Function Tests:  Recent Labs Lab 05/16/15 1147  AST 20  ALT 14*  ALKPHOS 92  BILITOT 0.6  PROT 6.9  ALBUMIN 3.2*   No results for input(s): LIPASE, AMYLASE in the last 168 hours. No results for input(s): AMMONIA in the last 168 hours. CBC:  Recent Labs Lab 05/16/15 1147  WBC 10.1  HGB 12.2*  HCT 36.3*  MCV 81.6  PLT 367   Cardiac Enzymes:  Recent Labs Lab 05/16/15 1905  TROPONINI 0.03    Radiological Exams on Admission: Dg Chest 2 View  05/16/2015   CLINICAL DATA:  Abdominal pain 4 days. Nausea and vomiting with constipation.  EXAM: CHEST  2 VIEW  COMPARISON:  03/18/2013 and 03/10/2013  FINDINGS: Lungs are hyperexpanded with flattening of the hemidiaphragms. There is no focal consolidation or effusion. There is borderline cardiomegaly. Calcified plaque is present over the thoracic aorta. Small nodular cluster over the lateral right midlung unchanged likely calcified granulomas. Remainder of the exam is unchanged.  IMPRESSION: No acute cardiopulmonary disease.  COPD.   Electronically Signed   By: Elberta Fortis M.D.   On: 05/16/2015 16:17   Ct Abdomen Pelvis W Contrast  05/16/2015   CLINICAL DATA:  Severe lower abdominal pain for 4 days, constipation, history  hypertension, former smoker, peptic ulcer disease  EXAM: CT ABDOMEN AND PELVIS WITH CONTRAST  TECHNIQUE: Multidetector CT imaging of the abdomen and pelvis was performed using the standard protocol following bolus administration of intravenous contrast. Sagittal and coronal MPR images reconstructed from axial data set.  CONTRAST:  OMNIPAQUE IOHEXOL 300 MG/ML SOLN IV. Dilute oral contrast.  COMPARISON:  09/29/2014  FINDINGS: Lung bases emphysematous but clear.  Large cyst arising from anterior aspect of mid LEFT kidney 7.5 x 5.4 x 7.1 cm.  Liver, spleen, pancreas, kidneys, and adrenal glands otherwise normal.  Stomach distended by contrast and food debris.  Appendix, cecum, and terminal ileum located within a large RIGHT inguinal hernia.  Numerous small bowel loops within a large LEFT inguinal hernia.  Bowel loops otherwise unremarkable  without evidence of bowel obstruction.  Extensive atherosclerotic calcifications.  Marked prostatic enlargement, gland 6.5 x 4.9 x 6.3 cm.  No mass, adenopathy, free air or free fluid.  Osseous demineralization with degenerative disc and facet disease changes of the lumbar spine.  IMPRESSION: Large LEFT renal cyst.  Small bowel loops within large LEFT inguinal hernia without obstruction.  Appendix, cecum and terminal ileum within large RIGHT inguinal hernia without obstruction.  Marked prostatic enlargement.  Extensive atherosclerotic disease.   Electronically Signed   By: Ulyses Southward M.D.   On: 05/16/2015 17:19    EKG: Independently reviewed.   Assessment/Plan Principal Problem:   Atrial fibrillation with RVR Admit for telemetry monitoring. The patient is on the rate control medication at home, but they do not know what is the name or dosage of this medication. I will use metoprolol while in the hospital and advised the patient and his relatives to use Beta blocker, instead of a calcium channel blocker to avoid further constipation if this is the medication he is  using at home.  Active Problems:   Hypercholesterolemia   Essential hypertension   Bilateral inguinal hernia   BPH (benign prostatic hyperplasia)   PUD (peptic ulcer disease) Unknown at this time what type of medications the patient is using for his other medical problems, but his family will bring the medications to the hospital.  General surgery was consulted by the emergency department.  Code Status: Full code. DVT Prophylaxis: Lovenox SQ. Family Communication:  Several family members were present with him in the room  Peoples,Tiara Other [phone]  [phone]   Shaw,Shirley Other [phone]    Disposition Plan: Admit for telemetry monitoring, serial troponin levels. Further disposition by general surgery.  Time spent: Over 70 minutes  Bobette Mo [facility] Pager [phone].

## 2015-05-17 NOTE — Progress Notes (Signed)
Paged MD about constipation.

## 2015-05-17 NOTE — Consult Note (Signed)
Reason for Consult: A fib RVR and preop clearance Referring Physician: Chet Greenley is an 81 y.o. male.  HPI:   The patient is a 81 yo male, whose wife past away last year, with a history of HTN, HLD, PUD, BPH, Afib according to what is listed in the chart.  He has a very remote tobacco history.  The patient reports he has never needed to go to the doctor.  He has been to the ER and urgent care periodically.  He does not remember anyone ever saying anything about Afib. He has never seen a cardiologist.  He presented because of abdominal pain that feels like "needles" in his stomach with nausea and vomiting.  He denies CP, orthopnea, SOB, LEE.   Family history is unknown.  His mother died when he was young.   He has one son who he has not seen in over a year.    Past Medical History  Diagnosis Date  . Hypertension   . Hypercholesterolemia   . ED (erectile dysfunction)   . Urinary frequency     bladder instablity and outlet obstruction   . Constipation   . PUD (peptic ulcer disease)     history of   . BPH (benign prostatic hyperplasia)   . Atrial fibrillation     Past Surgical History  Procedure Laterality Date  . Ankle surgery Right     History reviewed. No pertinent family history.  Social History:  reports that he has quit smoking. He does not have any smokeless tobacco history on file. He reports that he does not drink alcohol or use illicit drugs.  Allergies:  Allergies  Allergen Reactions  . Penicillins Swelling    Medications:  Scheduled Meds: . aspirin EC  81 mg Oral Daily  . enoxaparin (LOVENOX) injection  40 mg Subcutaneous Q24H  . metoprolol succinate  25 mg Oral Daily  . sodium chloride  3 mL Intravenous Q12H   Continuous Infusions:  PRN Meds:.iohexol, ondansetron **OR** ondansetron (ZOFRAN) IV, traMADol   Results for orders placed or performed during the hospital encounter of 05/16/15 (from the past 48 hour(s))  Comprehensive metabolic panel      Status: Abnormal   Collection Time: 05/16/15 11:47 AM  Result Value Ref Range   Sodium 135 135 - 145 mmol/L   Potassium 5.1 3.5 - 5.1 mmol/L   Chloride 100 (L) 101 - 111 mmol/L   CO2 27 22 - 32 mmol/L   Glucose, Bld 127 (H) 65 - 99 mg/dL   BUN 35 (H) 6 - 20 mg/dL   Creatinine, Ser 1.24 0.61 - 1.24 mg/dL   Calcium 9.5 8.9 - 10.3 mg/dL   Total Protein 6.9 6.5 - 8.1 g/dL   Albumin 3.2 (L) 3.5 - 5.0 g/dL   AST 20 15 - 41 U/L   ALT 14 (L) 17 - 63 U/L   Alkaline Phosphatase 92 38 - 126 U/L   Total Bilirubin 0.6 0.3 - 1.2 mg/dL   GFR calc non Af Amer 49 (L) >60 mL/min   GFR calc Af Amer 57 (L) >60 mL/min    Comment: (NOTE) The eGFR has been calculated using the CKD EPI equation. This calculation has not been validated in all clinical situations. eGFR's persistently <60 mL/min signify possible Chronic Kidney Disease.    Anion gap 8 5 - 15  CBC     Status: Abnormal   Collection Time: 05/16/15 11:47 AM  Result Value Ref Range  WBC 10.1 4.0 - 10.5 K/uL   RBC 4.45 4.22 - 5.81 MIL/uL   Hemoglobin 12.2 (L) 13.0 - 17.0 g/dL   HCT 36.3 (L) 39.0 - 52.0 %   MCV 81.6 78.0 - 100.0 fL   MCH 27.4 26.0 - 34.0 pg   MCHC 33.6 30.0 - 36.0 g/dL   RDW 13.2 11.5 - 15.5 %   Platelets 367 150 - 400 K/uL  Protime-INR     Status: None   Collection Time: 05/16/15  3:40 PM  Result Value Ref Range   Prothrombin Time 14.2 11.6 - 15.2 seconds   INR 1.08 0.00 - 1.49  Lactic acid, plasma     Status: None   Collection Time: 05/16/15  3:40 PM  Result Value Ref Range   Lactic Acid, Venous 1.5 0.5 - 2.0 mmol/L  Troponin I     Status: None   Collection Time: 05/16/15  7:05 PM  Result Value Ref Range   Troponin I 0.03 <0.031 ng/mL    Comment:        NO INDICATION OF MYOCARDIAL INJURY.   Lactic acid, plasma     Status: None   Collection Time: 05/16/15  7:08 PM  Result Value Ref Range   Lactic Acid, Venous 0.9 0.5 - 2.0 mmol/L  Magnesium     Status: None   Collection Time: 05/16/15  9:03 PM  Result  Value Ref Range   Magnesium 1.9 1.7 - 2.4 mg/dL  Urinalysis, Routine w reflex microscopic (not at [facility])     Status: Abnormal   Collection Time: 05/17/15  1:07 AM  Result Value Ref Range   Color, Urine YELLOW YELLOW   APPearance CLEAR CLEAR   Specific Gravity, Urine 1.035 (H) 1.005 - 1.030   pH 5.0 5.0 - 8.0   Glucose, UA NEGATIVE NEGATIVE mg/dL   Hgb urine dipstick NEGATIVE NEGATIVE   Bilirubin Urine NEGATIVE NEGATIVE   Ketones, ur NEGATIVE NEGATIVE mg/dL   Protein, ur NEGATIVE NEGATIVE mg/dL   Urobilinogen, UA 0.2 0.0 - 1.0 mg/dL   Nitrite NEGATIVE NEGATIVE   Leukocytes, UA NEGATIVE NEGATIVE    Comment: MICROSCOPIC NOT DONE ON URINES WITH NEGATIVE PROTEIN, BLOOD, LEUKOCYTES, NITRITE, OR GLUCOSE <1000 mg/dL.    Dg Chest 2 View  05/16/2015   CLINICAL DATA:  Abdominal pain 4 days. Nausea and vomiting with constipation.  EXAM: CHEST  2 VIEW  COMPARISON:  03/18/2013 and 03/10/2013  FINDINGS: Lungs are hyperexpanded with flattening of the hemidiaphragms. There is no focal consolidation or effusion. There is borderline cardiomegaly. Calcified plaque is present over the thoracic aorta. Small nodular cluster over the lateral right midlung unchanged likely calcified granulomas. Remainder of the exam is unchanged.  IMPRESSION: No acute cardiopulmonary disease.  COPD.   Electronically Signed   By: Marin Olp M.D.   On: 05/16/2015 16:17   Ct Abdomen Pelvis W Contrast  05/16/2015   CLINICAL DATA:  Severe lower abdominal pain for 4 days, constipation, history hypertension, former smoker, peptic ulcer disease  EXAM: CT ABDOMEN AND PELVIS WITH CONTRAST  TECHNIQUE: Multidetector CT imaging of the abdomen and pelvis was performed using the standard protocol following bolus administration of intravenous contrast. Sagittal and coronal MPR images reconstructed from axial data set.  CONTRAST:  123mL OMNIPAQUE IOHEXOL 300 MG/ML SOLN IV. Dilute oral contrast.  COMPARISON:  09/29/2014  FINDINGS: Lung bases  emphysematous but clear.  Large cyst arising from anterior aspect of mid LEFT kidney 7.5 x 5.4 x 7.1 cm.  Liver, spleen,  pancreas, kidneys, and adrenal glands otherwise normal.  Stomach distended by contrast and food debris.  Appendix, cecum, and terminal ileum located within a large RIGHT inguinal hernia.  Numerous small bowel loops within a large LEFT inguinal hernia.  Bowel loops otherwise unremarkable without evidence of bowel obstruction.  Extensive atherosclerotic calcifications.  Marked prostatic enlargement, gland 6.5 x 4.9 x 6.3 cm.  No mass, adenopathy, free air or free fluid.  Osseous demineralization with degenerative disc and facet disease changes of the lumbar spine.  IMPRESSION: Large LEFT renal cyst.  Small bowel loops within large LEFT inguinal hernia without obstruction.  Appendix, cecum and terminal ileum within large RIGHT inguinal hernia without obstruction.  Marked prostatic enlargement.  Extensive atherosclerotic disease.   Electronically Signed   By: Lavonia Dana M.D.   On: 05/16/2015 17:19    Review of Systems  Constitutional: Negative for fever.  HENT: Negative for congestion and sore throat.   Respiratory: Negative for cough, shortness of breath and wheezing.   Cardiovascular: Negative for chest pain, palpitations, orthopnea, leg swelling and PND.  Gastrointestinal: Positive for nausea, vomiting, abdominal pain and constipation.  Musculoskeletal: Negative for myalgias.  Neurological: Negative for dizziness.  All other systems reviewed and are negative.  Blood pressure 158/66, pulse 72, temperature 98.1 F (36.7 C), temperature source Oral, resp. rate 16, height $RemoveBe'5\' 6"'[id number]$  (1.676 m), weight 130 lb 6.4 oz (59.149 kg), SpO2 99 %. Physical Exam  Nursing note and vitals reviewed. Constitutional: He is oriented to person, place, and time. He appears well-developed and well-nourished. He appears distressed (Appears uncomfortable).  HENT:  Head: Normocephalic and atraumatic.  Eyes:  EOM are normal. Pupils are equal, round, and reactive to light. No scleral icterus.  Neck: Normal range of motion. Neck supple. No JVD present.  Cardiovascular: Normal rate, regular rhythm, S1 normal and S2 normal.   No murmur heard. Pulses:      Radial pulses are 2+ on the right side, and 2+ on the left side.       Dorsalis pedis pulses are 1+ on the right side, and 1+ on the left side.  Respiratory: Effort normal and breath sounds normal. He has no wheezes. He has no rales.  GI: Bowel sounds are normal.  Musculoskeletal: He exhibits no edema.  No LEE   Neurological: He is alert and oriented to person, place, and time. He exhibits normal muscle tone.  Skin: Skin is warm and dry.  Psychiatric: He has a normal mood and affect.    Assessment/Plan: Principal Problem:   Atrial fibrillation with RVR Active Problems:   Hypercholesterolemia   Essential hypertension   Bilateral inguinal hernia   BPH (benign prostatic hyperplasia)   PUD (peptic ulcer disease)   81 yo male, whose wife past away last year, with a history of HTN, HLD, PUD, BPH, Afib.  He has never had a cardiology workup.  Afib RVR this admission and spontaneously converted to NSR.  He was started on Toprol 25.  CHADSVASC is 3 at least but I am not sure if full dose anticoagulation is a good idea.  He should get a PT eval once current issues resolved so we get a good view of his functional status.  He says he was getting around ok prior to admission.  He lives alone.  Will review echo once completed and add to plan.  No ischemic changes on EKG.    Tarri Fuller, [place] 05/17/2015, 11:41 AM

## 2015-05-18 ENCOUNTER — Observation Stay (HOSPITAL_COMMUNITY): Payer: Medicare Other

## 2015-05-18 DIAGNOSIS — R1030 Lower abdominal pain, unspecified: Secondary | ICD-10-CM

## 2015-05-18 DIAGNOSIS — I4891 Unspecified atrial fibrillation: Secondary | ICD-10-CM

## 2015-05-18 DIAGNOSIS — K4021 Bilateral inguinal hernia, without obstruction or gangrene, recurrent: Secondary | ICD-10-CM | POA: Diagnosis not present

## 2015-05-18 DIAGNOSIS — I10 Essential (primary) hypertension: Secondary | ICD-10-CM | POA: Diagnosis not present

## 2015-05-18 NOTE — Progress Notes (Signed)
Subjective: No complaints Denies abdominal pain  Objective: Vital signs in last 24 hours: Temp:  [97.8 F (36.6 C)-98.5 F (36.9 C)] 98.5 F (36.9 C) (08/29 0446) Pulse Rate:  [63] 63 (08/29 0446) Resp:  [16] 16 (08/28 1449) BP: (113-133)/(54-57) 113/54 mmHg (08/29 0446) SpO2:  [98 %-100 %] 98 % (08/29 0446) Weight:  [57.153 kg (126 lb)] 57.153 kg (126 lb) (08/29 0446) Last BM Date:  (Pt reports he hast had BMx1 week)  Intake/Output from previous day: 08/28 0701 - 08/29 0700 In: 240 [P.O.:240] Out: 475 [Urine:475] Intake/Output this shift: Total I/O In: -  Out: 200 [Urine:200]  Abdomen soft, non tender, bilateral hernia easily reducible  Lab Results:   Recent Labs  05/16/15 1147 05/17/15 1406  WBC 10.1 8.9  HGB 12.2* 12.5*  HCT 36.3* 37.6*  PLT 367 391   BMET  Recent Labs  05/16/15 1147 05/17/15 1406  NA 135 138  K 5.1 4.6  CL 100* 102  CO2 27 27  GLUCOSE 127* 100*  BUN 35* 21*  CREATININE 1.24 0.95  CALCIUM 9.5 9.2   PT/INR  Recent Labs  05/16/15 1540  LABPROT 14.2  INR 1.08   ABG No results for input(s): PHART, HCO3 in the last 72 hours.  Invalid input(s): PCO2, PO2  Studies/Results: Dg Chest 2 View  05/16/2015   CLINICAL DATA:  Abdominal pain 4 days. Nausea and vomiting with constipation.  EXAM: CHEST  2 VIEW  COMPARISON:  03/18/2013 and 03/10/2013  FINDINGS: Lungs are hyperexpanded with flattening of the hemidiaphragms. There is no focal consolidation or effusion. There is borderline cardiomegaly. Calcified plaque is present over the thoracic aorta. Small nodular cluster over the lateral right midlung unchanged likely calcified granulomas. Remainder of the exam is unchanged.  IMPRESSION: No acute cardiopulmonary disease.  COPD.   Electronically Signed   By: Elberta Fortis M.D.   On: 05/16/2015 16:17   Ct Abdomen Pelvis W Contrast  05/16/2015   CLINICAL DATA:  Severe lower abdominal pain for 4 days, constipation, history hypertension,  former smoker, peptic ulcer disease  EXAM: CT ABDOMEN AND PELVIS WITH CONTRAST  TECHNIQUE: Multidetector CT imaging of the abdomen and pelvis was performed using the standard protocol following bolus administration of intravenous contrast. Sagittal and coronal MPR images reconstructed from axial data set.  CONTRAST:  OMNIPAQUE IOHEXOL 300 MG/ML SOLN IV. Dilute oral contrast.  COMPARISON:  09/29/2014  FINDINGS: Lung bases emphysematous but clear.  Large cyst arising from anterior aspect of mid LEFT kidney 7.5 x 5.4 x 7.1 cm.  Liver, spleen, pancreas, kidneys, and adrenal glands otherwise normal.  Stomach distended by contrast and food debris.  Appendix, cecum, and terminal ileum located within Norton large RIGHT inguinal hernia.  Numerous small bowel loops within Norton large LEFT inguinal hernia.  Bowel loops otherwise unremarkable without evidence of bowel obstruction.  Extensive atherosclerotic calcifications.  Marked prostatic enlargement, gland 6.5 x 4.9 x 6.3 cm.  No mass, adenopathy, free air or free fluid.  Osseous demineralization with degenerative disc and facet disease changes of the lumbar spine.  IMPRESSION: Large LEFT renal cyst.  Small bowel loops within large LEFT inguinal hernia without obstruction.  Appendix, cecum and terminal ileum within large RIGHT inguinal hernia without obstruction.  Marked prostatic enlargement.  Extensive atherosclerotic disease.   Electronically Signed   By: Ulyses Southward M.D.   On: 05/16/2015 17:19   Dg Abd 2 Views  05/17/2015   CLINICAL DATA:  Inguinal hernia, mid abdominal pain.  EXAM: ABDOMEN - 2 VIEW  COMPARISON:  CT 05/16/2015  FINDINGS: Oral contrast material seen within the colon. Mild diffuse gaseous distention of bowel. No evidence of bowel obstruction. No free air. No organomegaly.  IMPRESSION: Mild diffuse gaseous distention of bowel, likely mild ileus. No evidence of bowel obstruction or free air.   Electronically Signed   By: Charlett Nose M.D.   On: 05/17/2015  12:06    Anti-infectives: Anti-infectives    None      Assessment/Plan: s/p * No surgery found * Bilateral inguinal hernia  Patient leaning toward elective repair this week while in the hospital. Will plan accordingly if ok with primary team and cardiology (which looks like they are ok with)     Luis Norton 05/18/2015

## 2015-05-18 NOTE — Progress Notes (Signed)
Echocardiogram 2D Echocardiogram has been performed.  Tye Savoy 05/18/2015, 11:03 AM

## 2015-05-18 NOTE — Evaluation (Signed)
Physical Therapy Evaluation Patient Details Name: Luis Norton MRN: [id number] DOB: 20-Jul-1922 Today's Date: 05/18/2015   History of Present Illness  Luis Norton is a 81 y.o. male with a past medical history of atrial fibrillation, BPH, hypertension, hyperlipidemia, erectile dysfunction, constipation, PUD who came into the emergency department due to outflow chief complaint. Patient has a history of bilateral inguinal hernias which occasionally given pain.  Admitted due to hernia pain, nausea and a-fib w/RVR.  Clinical Impression  Patient presents with decreased independence with mobility due to deficits listed in PT problem list.  He will benefit from skilled PT in the acute setting to allow return home with assist and HHPT. Patient reluctant to use a device and not sure how safe would be in the home with versus without it.  Would like to have HHPT safety eval prior to decision for long term anticoagulation.  Patient states he has help in the home, but unsure how much assist he normally has for mobility up on his feet.    Follow Up Recommendations Home health PT    Equipment Recommendations  Rolling walker with 5" wheels    Recommendations for Other Services       Precautions / Restrictions Precautions Precautions: Fall      Mobility  Bed Mobility Overal bed mobility: Modified Independent                Transfers Overall transfer level: Needs assistance   Transfers: Sit to/from Stand Sit to Stand: Supervision         General transfer comment: assist for safety, pt pushes up from bed appropriately  Ambulation/Gait Ambulation/Gait assistance: Min guard Ambulation Distance (Feet): 250 Feet Assistive device: None Gait Pattern/deviations: Step-through pattern;Decreased stride length;Scissoring;Narrow base of support     General Gait Details: left hip occasional trendelenberg and note generally stiff posture when ambulating, though pt denies pain, did admit to  stiffness in joints after being in bed for a while  Stairs            Wheelchair Mobility    Modified Rankin (Stroke Patients Only)       Balance Overall balance assessment: Needs assistance   Sitting balance-Leahy Scale: Good     Standing balance support: No upper extremity supported Standing balance-Leahy Scale: Fair Standing balance comment: static standing without signs of instability, with gait needs minguard for safety                             Pertinent Vitals/Pain Pain Assessment: No/denies pain    Home Living Family/patient expects to be discharged to:: Private residence Living Arrangements: Other relatives Available Help at Discharge: Available 24 hours/day Type of Home: Apartment       Home Layout: One level Home Equipment: None Additional Comments: reports neice owns business of taking care of elderly out of her home and she takes care of him    Prior Function Level of Independence: Needs assistance   Gait / Transfers Assistance Needed: reports no walking device used at home  ADL's / Homemaking Assistance Needed: reports neice helps with bathing and has a seat in shower        Hand Dominance   Dominant Hand: Right    Extremity/Trunk Assessment               Lower Extremity Assessment: Overall WFL for tasks assessed         Communication   Communication: No difficulties  Cognition Arousal/Alertness: Awake/alert Behavior During Therapy: WFL for tasks assessed/performed Overall Cognitive Status: No family/caregiver present to determine baseline cognitive functioning                      General Comments General comments (skin integrity, edema, etc.): Patient verbalized his wife recently passed, but seems in good spirits and happy to be at home where his neice (wife's mother's sister's daughter) is helping him.    Exercises        Assessment/Plan    PT Assessment Patient needs continued PT services   PT Diagnosis Generalized weakness;Abnormality of gait   PT Problem List Decreased activity tolerance;Decreased balance;Decreased mobility;Decreased knowledge of use of DME  PT Treatment Interventions DME instruction;Gait training;Functional mobility training;Patient/family education;Therapeutic activities;Therapeutic exercise;Balance training   PT Goals (Current goals can be found in the Care Plan section) Acute Rehab PT Goals Patient Stated Goal: To go home PT Goal Formulation: With patient Time For Goal Achievement: 06/01/15 Potential to Achieve Goals: Good    Frequency Min 3X/week   Barriers to discharge        Co-evaluation               End of Session Equipment Utilized During Treatment: Gait belt Activity Tolerance: Patient tolerated treatment well Patient left: in chair;with call bell/phone within reach;with chair alarm set Nurse Communication: Mobility status    Functional Assessment Tool Used: Clinical Judgement Functional Limitation: Mobility: Walking and moving around Mobility: Walking and Moving Around Current Status [phone]): At least 20 percent but less than 40 percent impaired, limited or restricted Mobility: Walking and Moving Around Goal Status [phone]): At least 1 percent but less than 20 percent impaired, limited or restricted    Time: 1055-1120 PT Time Calculation (min) (ACUTE ONLY): 25 min   Charges:   PT Evaluation $Initial PT Evaluation Tier I: 1 Procedure PT Treatments $Gait Training: 8-22 mins   PT G Codes:   PT G-Codes **NOT FOR INPATIENT CLASS** Functional Assessment Tool Used: Clinical Judgement Functional Limitation: Mobility: Walking and moving around Mobility: Walking and Moving Around Current Status ([id number]): At least 20 percent but less than 40 percent impaired, limited or restricted Mobility: Walking and Moving Around Goal Status [phone]): At least 1 percent but less than 20 percent impaired, limited or restricted     [facility] 05/18/2015, 11:58 AM  Sheran Lawless, PT [phone] 05/18/2015

## 2015-05-18 NOTE — Progress Notes (Signed)
Patient Name: Luis Norton Date of Encounter: 05/18/2015   SUBJECTIVE  Complains of abdominal pain. No chest pain, sob or palpitations.   CURRENT MEDS . aspirin EC  81 mg Oral Daily  . enoxaparin (LOVENOX) injection  40 mg Subcutaneous Q24H  . metoprolol succinate  25 mg Oral Daily  . polyethylene glycol  17 g Oral Daily  . sodium chloride  3 mL Intravenous Q12H    OBJECTIVE  Filed Vitals:   05/17/15 0556 05/17/15 1449 05/17/15 2015 05/18/15 0446  BP: 158/66 133/55 123/57 113/54  Pulse: 72 63 63 63  Temp: 98.1 F (36.7 C) 98.5 F (36.9 C) 97.8 F (36.6 C) 98.5 F (36.9 C)  TempSrc: Oral Oral Oral Oral  Resp: 16 16    Height:      Weight:    126 lb (57.153 kg)  SpO2: 99% 98% 100% 98%    Intake/Output Summary (Last 24 hours) at 05/18/15 0916 Last data filed at 05/18/15 0900  Gross per 24 hour  Intake    480 ml  Output    475 ml  Net      5 ml   Filed Weights   05/16/15 2225 05/18/15 0446  Weight: 130 lb 6.4 oz (59.149 kg) 126 lb (57.153 kg)    PHYSICAL EXAM  General: Pleasant, NAD. Neuro: Alert and oriented X 3. Moves all extremities spontaneously. Psych: Normal affect. HEENT:  Normal  Neck: Supple without bruits or JVD. Lungs:  Resp regular and unlabored, CTA. Heart: RRR no s3, s4, or murmurs. Abdomen: Soft, TTP, BS + x 4.  Extremities: No clubbing, cyanosis or edema. DP/PT/Radials 2+ and equal bilaterally.  Accessory Clinical Findings  CBC  Recent Labs  05/16/15 1147 05/17/15 1406  WBC 10.1 8.9  NEUTROABS  --  6.1  HGB 12.2* 12.5*  HCT 36.3* 37.6*  MCV 81.6 83.0  PLT 367 391   Basic Metabolic Panel  Recent Labs  05/16/15 1147 05/16/15 2103 05/17/15 1406  NA 135  --  138  K 5.1  --  4.6  CL 100*  --  102  CO2 27  --  27  GLUCOSE 127*  --  100*  BUN 35*  --  21*  CREATININE 1.24  --  0.95  CALCIUM 9.5  --  9.2  MG  --  1.9  --    Liver Function Tests  Recent Labs  05/16/15 1147  AST 20  ALT 14*  ALKPHOS 92  BILITOT  0.6  PROT 6.9  ALBUMIN 3.2*   No results for input(s): LIPASE, AMYLASE in the last 72 hours. Cardiac Enzymes  Recent Labs  05/16/15 1905  TROPONINI 0.03    TELE  NSR with few PVCs.  Radiology/Studies  Dg Chest 2 View  05/16/2015   CLINICAL DATA:  Abdominal pain 4 days. Nausea and vomiting with constipation.  EXAM: CHEST  2 VIEW  COMPARISON:  03/18/2013 and 03/10/2013  FINDINGS: Lungs are hyperexpanded with flattening of the hemidiaphragms. There is no focal consolidation or effusion. There is borderline cardiomegaly. Calcified plaque is present over the thoracic aorta. Small nodular cluster over the lateral right midlung unchanged likely calcified granulomas. Remainder of the exam is unchanged.  IMPRESSION: No acute cardiopulmonary disease.  COPD.   Electronically Signed   By: Elberta Fortis M.D.   On: 05/16/2015 16:17   Ct Abdomen Pelvis W Contrast  05/16/2015   CLINICAL DATA:  Severe lower abdominal pain for 4 days, constipation, history hypertension, former smoker, peptic  ulcer disease  EXAM: CT ABDOMEN AND PELVIS WITH CONTRAST  TECHNIQUE: Multidetector CT imaging of the abdomen and pelvis was performed using the standard protocol following bolus administration of intravenous contrast. Sagittal and coronal MPR images reconstructed from axial data set.  CONTRAST:  OMNIPAQUE IOHEXOL 300 MG/ML SOLN IV. Dilute oral contrast.  COMPARISON:  09/29/2014  FINDINGS: Lung bases emphysematous but clear.  Large cyst arising from anterior aspect of mid LEFT kidney 7.5 x 5.4 x 7.1 cm.  Liver, spleen, pancreas, kidneys, and adrenal glands otherwise normal.  Stomach distended by contrast and food debris.  Appendix, cecum, and terminal ileum located within a large RIGHT inguinal hernia.  Numerous small bowel loops within a large LEFT inguinal hernia.  Bowel loops otherwise unremarkable without evidence of bowel obstruction.  Extensive atherosclerotic calcifications.  Marked prostatic enlargement, gland  6.5 x 4.9 x 6.3 cm.  No mass, adenopathy, free air or free fluid.  Osseous demineralization with degenerative disc and facet disease changes of the lumbar spine.  IMPRESSION: Large LEFT renal cyst.  Small bowel loops within large LEFT inguinal hernia without obstruction.  Appendix, cecum and terminal ileum within large RIGHT inguinal hernia without obstruction.  Marked prostatic enlargement.  Extensive atherosclerotic disease.   Electronically Signed   By: Ulyses Southward M.D.   On: 05/16/2015 17:19   Dg Abd 2 Views  05/17/2015   CLINICAL DATA:  Inguinal hernia, mid abdominal pain.  EXAM: ABDOMEN - 2 VIEW  COMPARISON:  CT 05/16/2015  FINDINGS: Oral contrast material seen within the colon. Mild diffuse gaseous distention of bowel. No evidence of bowel obstruction. No free air. No organomegaly.  IMPRESSION: Mild diffuse gaseous distention of bowel, likely mild ileus. No evidence of bowel obstruction or free air.   Electronically Signed   By: Charlett Nose M.D.   On: 05/17/2015 12:06    ASSESSMENT AND PLAN 1. Atrial fibrillation with RVR - Run of afib with AVR in the ER and then another episode of atrial flutter with RVR after admission -Converted spontaneously to NSR. Maintaining sinus rhythm.  - No anticoagulation due to fall risk. Continue BB.  - Pending echo.  - No further chest pain or SOB. Trop x1 negative.    2. Essential hypertension - stable and well controlled.     3. Bilateral inguinal hernia: Per patient likely surgery tomorrow. He is at moderate risk for surgery. Pending echo.  4. BPH (benign prostatic hyperplasia) 5. PUD (peptic ulcer disease)   Signed, Bhagat,Bhavinkumar PA-C    History and all data above reviewed.  Patient examined.  I agree with the findings as above.  He complains of some mild epigastric pain.  He is tender to touch on his lower chest .  The patient exam reveals COR:RRR  ,  Lungs: Clear  ,  Abd: Positive bowel sounds, no rebound no guarding, Ext No edema  .  All  available labs, radiology testing, previous records reviewed. Agree with documented assessment and plan. Atrial fib.  On beta blockers.  Now in NSR.  Echo pending.  No other change in therapy at this point.    Fayrene Fearing Skarlet Lyons  11:20 AM  05/18/2015

## 2015-05-18 NOTE — Progress Notes (Signed)
Patient ID: Murdock Jellison, male   DOB: [birth year], 81 y.o.   MRN: [id number] [facility] PROGRESS NOTE  Rivaldo Hineman WUJ:[id number] DOB: January 05, 1922 DOA: 05/16/2015 PCP: Katy Apo, MD  Brief narrative:    81 y.o. male with a past medical history of atrial fibrillation, BPH, hypertension, hyperlipidemia who presented to [facility] ED with pain in the groin area with associated increasing swelling and bulging that has been going on for past 5 days prior to this admission. He also had nausea and vomiting over past 1-2 days. He saw his PCP he was able to reduce the hernia but pt decided to come to ED due to ongoing pain. In ED, pt had atrial fibrillation with RVR (HR in 140-150's). Cardiology and surgery have seen the pt in consultation.  Barrier to discharge: Plan for surgery likely this week; will get PT eval and hope he can be discharged 1-2 days after surgery depending on postoperative course.    Assessment/Plan:    Principal Problem: Groin pain in the setting on  Inguinal hernia - Appreciate surgery seeing the pt in consultation - Plan for surgery this week - Continue pain management efforts - Diet as tolerated other than NPO prior to surgery  Active Problems: Atrial fibrillation with RVR - CHADS vasc score of 2 (age, hypertension) - Rate controlled with metoprolol 25 mg daily - Continue daily aspirin; not on other AC due to risk of falls   Essential hypertension - Continue daily metoprolol   DVT Prophylaxis  - Lovenox subQ ordered    Code Status: Full.  Family Communication:  plan of care discussed with the patient Disposition Plan: PT evaluation once pt able to participate; D/C after surgery    IV access:  Peripheral IV  Procedures and diagnostic studies:    Dg Chest 2 View 05/16/2015  No acute cardiopulmonary disease.  COPD.   Electronically Signed   By: Elberta Fortis M.D.   On: 05/16/2015 16:17   Ct Abdomen Pelvis W Contrast 05/16/2015  Large LEFT renal cyst.  Small  bowel loops within large LEFT inguinal hernia without obstruction.  Appendix, cecum and terminal ileum within large RIGHT inguinal hernia without obstruction.  Marked prostatic enlargement.  Extensive atherosclerotic disease.   Electronically Signed   By: Ulyses Southward M.D.   On: 05/16/2015 17:19   Dg Abd 2 Views 05/17/2015  Mild diffuse gaseous distention of bowel, likely mild ileus. No evidence of bowel obstruction or free air.   Electronically Signed   By: Charlett Nose M.D.   On: 05/17/2015 12:06    Medical Consultants:  Cardiology Surgery  Other Consultants:  Physical therapy  IAnti-Infectives:   None    Manson Passey, MD  [facility] Pager [phone]  Time spent in minutes: 25 minutes  If 7PM-7AM, please contact night-coverage [email or web address] Password [email or web address] 05/18/2015, 8:12 PM      HPI/Subjective: No acute overnight events. Patient reports feeling little better this am.   Objective: Filed Vitals:   05/17/15 2015 05/18/15 0446 05/18/15 1344 05/18/15 2001  BP: 123/57 113/54 136/76 124/54  Pulse: 63 63 65 63  Temp: 97.8 F (36.6 C) 98.5 F (36.9 C) 98.7 F (37.1 C) 98.1 F (36.7 C)  TempSrc: Oral Oral Oral Oral  Resp:   15   Height:      Weight:  57.153 kg (126 lb)    SpO2: 100% 98% 100% 100%    Intake/Output Summary (Last 24 hours) at 05/18/15 2012 Last data filed at 05/18/15 1700  Gross  per 24 hour  Intake    680 ml  Output    575 ml  Net    105 ml    Exam:   General:  Pt is alert, follows commands appropriately, not in acute distress  Cardiovascular: irregular rhythm, S1/S2 appreciated   Respiratory: Clear to auscultation bilaterally, no wheezing, no crackles, no rhonchi  Abdomen: Soft, non tender, non distended, bowel sounds present  Extremities: No edema, pulses DP and PT palpable bilaterally  Neuro: Grossly nonfocal  Data Reviewed: Basic Metabolic Panel:  Recent Labs Lab 05/16/15 1147 05/16/15 2103 05/17/15 1406  NA 135  --  138   K 5.1  --  4.6  CL 100*  --  102  CO2 27  --  27  GLUCOSE 127*  --  100*  BUN 35*  --  21*  CREATININE 1.24  --  0.95  CALCIUM 9.5  --  9.2  MG  --  1.9  --    Liver Function Tests:  Recent Labs Lab 05/16/15 1147  AST 20  ALT 14*  ALKPHOS 92  BILITOT 0.6  PROT 6.9  ALBUMIN 3.2*   No results for input(s): LIPASE, AMYLASE in the last 168 hours. No results for input(s): AMMONIA in the last 168 hours. CBC:  Recent Labs Lab 05/16/15 1147 05/17/15 1406  WBC 10.1 8.9  NEUTROABS  --  6.1  HGB 12.2* 12.5*  HCT 36.3* 37.6*  MCV 81.6 83.0  PLT 367 391   Cardiac Enzymes:  Recent Labs Lab 05/16/15 1905  TROPONINI 0.03   BNP: Invalid input(s): POCBNP CBG: No results for input(s): GLUCAP in the last 168 hours.  No results found for this or any previous visit (from the past 240 hour(s)).   Scheduled Meds: . aspirin EC  81 mg Oral Daily  . enoxaparin (LOVENOX) injection  40 mg Subcutaneous Q24H  . metoprolol succinate  25 mg Oral Daily  . polyethylene glycol  17 g Oral Daily  . sodium chloride  3 mL Intravenous Q12H   Continuous Infusions:

## 2015-05-19 DIAGNOSIS — N400 Enlarged prostate without lower urinary tract symptoms: Secondary | ICD-10-CM

## 2015-05-19 DIAGNOSIS — I10 Essential (primary) hypertension: Secondary | ICD-10-CM | POA: Diagnosis not present

## 2015-05-19 DIAGNOSIS — I4891 Unspecified atrial fibrillation: Secondary | ICD-10-CM | POA: Diagnosis not present

## 2015-05-19 DIAGNOSIS — K4021 Bilateral inguinal hernia, without obstruction or gangrene, recurrent: Secondary | ICD-10-CM | POA: Diagnosis not present

## 2015-05-19 MED ORDER — ENOXAPARIN SODIUM 40 MG/0.4ML ~~LOC~~ SOLN
40.0000 mg | SUBCUTANEOUS | Status: DC
  Administered 2015-05-21: 40 mg via SUBCUTANEOUS
  Filled 2015-05-19: qty 0.4

## 2015-05-19 MED ORDER — CIPROFLOXACIN IN D5W 400 MG/200ML IV SOLN
400.0000 mg | INTRAVENOUS | Status: AC
  Administered 2015-05-20: 400 mg via INTRAVENOUS
  Filled 2015-05-19 (×2): qty 200

## 2015-05-19 MED ORDER — CIPROFLOXACIN IN D5W 400 MG/200ML IV SOLN: 400.0000 mg | INTRAVENOUS | Status: DC

## 2015-05-19 NOTE — Progress Notes (Signed)
    SUBJECTIVE:  No chest pain.  No SOB   PHYSICAL EXAM Filed Vitals:   05/18/15 0446 05/18/15 1344 05/18/15 2001 05/19/15 0429  BP: 113/54 136/76 124/54 134/60  Pulse: 63 65 63 66  Temp: 98.5 F (36.9 C) 98.7 F (37.1 C) 98.1 F (36.7 C) 98 F (36.7 C)  TempSrc: Oral Oral Oral Axillary  Resp:  15    Height:      Weight: 126 lb (57.153 kg)   132 lb 3.2 oz (59.966 kg)  SpO2: 98% 100% 100% 98%   General:  No distress Lungs:  Clear Heart:  RRR Abdomen:  Positive bowel sounds, no rebound no guarding Extremities:  No edema  LABS: Lab Results  Component Value Date   TROPONINI 0.03 05/16/2015   No results found for this or any previous visit (from the past 24 hour(s)).  Intake/Output Summary (Last 24 hours) at 05/19/15 1045 Last data filed at 05/19/15 1002  Gross per 24 hour  Intake    800 ml  Output    350 ml  Net    450 ml    ECHO:  - Left ventricle: The cavity size was normal. There was mild concentric hypertrophy. Systolic function was mildly reduced. The estimated ejection fraction was in the range of 45% to 50%. There is akinesis of the basalinferior myocardium. Doppler parameters are consistent with abnormal left ventricular relaxation (grade 1 diastolic dysfunction). - Aortic valve: Valve mobility was restricted.  ASSESSMENT AND PLAN:  ATRIAL FIB:  Not on anticoagulation secondary to risk of falls.  HR has been fine and he is in NSR. Marland Kitchen    HTN:  BP well controlled.  Continue therapy.    CARDIOMYOPATHY:  Mildly reduced EF without significant LV dysfunction.  Euvolemic.  No change in therapy.    PREOP:  He does not have any prohibitive risk for a necessary surgery from a cardiac standpoint.  He does have a vague history of chest pain.  However, he does not have EKG changes or troponin elevation.  I talked with him at length today and he describes a chronic pattern of atypical pain.  He has had no clear unstable angina or high risk findings.  He does  have a regional wall motion abnormality on his echo.  However, according to the ACC/AHA guidelines there are no high risk findings that would require further cardiovascular testing prior to the planned surgery.      Fayrene Fearing [facility] 05/19/2015 10:45 AM

## 2015-05-19 NOTE — Progress Notes (Signed)
Patient ID: Luis Norton, male   DOB: 02-15-1922, 81 y.o.   MRN: [id number] [facility] PROGRESS NOTE  [name] WUJ:[id number] DOB: 1922/02/13 DOA: 05/16/2015 PCP: Katy Apo, MD  Brief narrative:   Feels okay, denies any complaints, denies any chest pain or shortness of breath. General surgery plans for hernia repair in the morning noted.  Brief narrative:    81 y.o. male with a past medical history of atrial fibrillation, BPH, hypertension, hyperlipidemia who presented to [facility] ED with pain in the groin area with associated increasing swelling and bulging that has been going on for past 5 days prior to this admission. He also had nausea and vomiting over past 1-2 days. He saw his PCP he was able to reduce the hernia but pt decided to come to ED due to ongoing pain. In ED, pt had atrial fibrillation with RVR (HR in 140-150's). Cardiology and surgery have seen the pt in consultation.  Barrier to discharge: Evaluate after the hernia repair surgery.  Assessment/Plan:    Principal Problem: Groin pain in the setting on  Inguinal hernia - Appreciate surgery seeing the pt in consultation - Plan for surgery this week - Continue pain management efforts - Diet as tolerated other than NPO prior to surgery  Active Problems: Atrial fibrillation with RVR - CHA2DS-Vasc score of 3 (age, hypertension) - Rate controlled with metoprolol 25 mg daily - Continue daily aspirin; not on other AC due to risk of falls   Essential hypertension - Continue daily metoprolol  Preoperative risk stratification -Seen by cardiology, patient does not have CAD, ventricular arrhythmias, CVA/TIA, CHF, CKD or diabetes mellitus type 2. -Patient was not clear about his exercise tolerance, advanced age is not risk factor according to ACC/AHA guidelines. -Per cardiology recommendation, no further testing, proceed to surgery.  DVT Prophylaxis  - Lovenox subQ ordered    Code Status: Full.  Family  Communication:  plan of care discussed with the patient Disposition Plan: PT evaluation once pt able to participate; D/C after surgery    IV access:  Peripheral IV  Procedures and diagnostic studies:    Dg Chest 2 View 05/16/2015  No acute cardiopulmonary disease.  COPD.   Electronically Signed   By: Elberta Fortis M.D.   On: 05/16/2015 16:17   Ct Abdomen Pelvis W Contrast 05/16/2015  Large LEFT renal cyst.  Small bowel loops within large LEFT inguinal hernia without obstruction.  Appendix, cecum and terminal ileum within large RIGHT inguinal hernia without obstruction.  Marked prostatic enlargement.  Extensive atherosclerotic disease.   Electronically Signed   By: Ulyses Southward M.D.   On: 05/16/2015 17:19   Dg Abd 2 Views 05/17/2015  Mild diffuse gaseous distention of bowel, likely mild ileus. No evidence of bowel obstruction or free air.   Electronically Signed   By: Charlett Nose M.D.   On: 05/17/2015 12:06    Medical Consultants:  Cardiology Surgery  Other Consultants:  Physical therapy  IAnti-Infectives:   None    Nashla Althoff A, MD  [facility] Pager [phone]  Time spent in minutes: 25 minutes  If 7PM-7AM, please contact night-coverage [email or web address] Password [email or web address] 05/19/2015, 11:17 AM      HPI/Subjective: No acute overnight events. Patient reports feeling little better this am.   Objective: Filed Vitals:   05/18/15 0446 05/18/15 1344 05/18/15 2001 05/19/15 0429  BP: 113/54 136/76 124/54 134/60  Pulse: 63 65 63 66  Temp: 98.5 F (36.9 C) 98.7 F (37.1 C) 98.1 F (36.7 C) 98  F (36.7 C)  TempSrc: Oral Oral Oral Axillary  Resp:  15    Height:      Weight: 57.153 kg (126 lb)   59.966 kg (132 lb 3.2 oz)  SpO2: 98% 100% 100% 98%    Intake/Output Summary (Last 24 hours) at 05/19/15 1117 Last data filed at 05/19/15 1002  Gross per 24 hour  Intake    800 ml  Output    350 ml  Net    450 ml    Exam:   General:  Pt is alert, follows commands  appropriately, not in acute distress  Cardiovascular: irregular rhythm, S1/S2 appreciated   Respiratory: Clear to auscultation bilaterally, no wheezing, no crackles, no rhonchi  Abdomen: Soft, non tender, non distended, bowel sounds present  Extremities: No edema, pulses DP and PT palpable bilaterally  Neuro: Grossly nonfocal  Data Reviewed: Basic Metabolic Panel:  Recent Labs Lab 05/16/15 1147 05/16/15 2103 05/17/15 1406  NA 135  --  138  K 5.1  --  4.6  CL 100*  --  102  CO2 27  --  27  GLUCOSE 127*  --  100*  BUN 35*  --  21*  CREATININE 1.24  --  0.95  CALCIUM 9.5  --  9.2  MG  --  1.9  --    Liver Function Tests:  Recent Labs Lab 05/16/15 1147  AST 20  ALT 14*  ALKPHOS 92  BILITOT 0.6  PROT 6.9  ALBUMIN 3.2*   No results for input(s): LIPASE, AMYLASE in the last 168 hours. No results for input(s): AMMONIA in the last 168 hours. CBC:  Recent Labs Lab 05/16/15 1147 05/17/15 1406  WBC 10.1 8.9  NEUTROABS  --  6.1  HGB 12.2* 12.5*  HCT 36.3* 37.6*  MCV 81.6 83.0  PLT 367 391   Cardiac Enzymes:  Recent Labs Lab 05/16/15 1905  TROPONINI 0.03   BNP: Invalid input(s): POCBNP CBG: No results for input(s): GLUCAP in the last 168 hours.  No results found for this or any previous visit (from the past 240 hour(s)).   Scheduled Meds: . aspirin EC  81 mg Oral Daily  . [START ON 05/20/2015] ciprofloxacin  400 mg Intravenous To SS-Surg  . enoxaparin (LOVENOX) injection  40 mg Subcutaneous Q24H  . metoprolol succinate  25 mg Oral Daily  . polyethylene glycol  17 g Oral Daily  . sodium chloride  3 mL Intravenous Q12H   Continuous Infusions:

## 2015-05-19 NOTE — Progress Notes (Signed)
Patient ID: Luis Norton, male   DOB: 06/21/1922, 81 y.o.   MRN: [id number]    Subjective: Pt says he feels well today.  Has voided on himself.  No nausea.  Objective: Vital signs in last 24 hours: Temp:  [98 F (36.7 C)-98.7 F (37.1 C)] 98 F (36.7 C) (08/30 0429) Pulse Rate:  [63-66] 66 (08/30 0429) Resp:  [15] 15 (08/29 1344) BP: (124-136)/(54-76) 134/60 mmHg (08/30 0429) SpO2:  [98 %-100 %] 98 % (08/30 0429) Weight:  [59.966 kg (132 lb 3.2 oz)] 59.966 kg (132 lb 3.2 oz) (08/30 0429) Last BM Date:  (Pt reports he hast had BMx1 week)  Intake/Output from previous day: 08/29 0701 - 08/30 0700 In: 680 [P.O.:680] Out: 450 [Urine:450] Intake/Output this shift:    PE: Abd: soft, NT, bilateral inguinal hernias palpated.  Right sided partially reduced.  Lab Results:   Recent Labs  05/16/15 1147 05/28/15 1406  WBC 10.1 8.9  HGB 12.2* 12.5*  HCT 36.3* 37.6*  PLT 367 391   BMET  Recent Labs  05/16/15 1147 May 28, 2015 1406  NA 135 138  K 5.1 4.6  CL 100* 102  CO2 27 27  GLUCOSE 127* 100*  BUN 35* 21*  CREATININE 1.24 0.95  CALCIUM 9.5 9.2   PT/INR  Recent Labs  05/16/15 1540  LABPROT 14.2  INR 1.08   CMP     Component Value Date/Time   NA 138 2015/05/28 1406   K 4.6 05-28-15 1406   CL 102 2015/05/28 1406   CO2 27 May 28, 2015 1406   GLUCOSE 100* May 28, 2015 1406   BUN 21* 28-May-2015 1406   CREATININE 0.95 05-28-2015 1406   CALCIUM 9.2 May 28, 2015 1406   PROT 6.9 05/16/2015 1147   ALBUMIN 3.2* 05/16/2015 1147   AST 20 05/16/2015 1147   ALT 14* 05/16/2015 1147   ALKPHOS 92 05/16/2015 1147   BILITOT 0.6 05/16/2015 1147   GFRNONAA >60 05-28-2015 1406   GFRAA >60 05-28-15 1406   Lipase     Component Value Date/Time   LIPASE 35 12/02/2014 1435       Studies/Results: Dg Abd 2 Views  2015-05-28   CLINICAL DATA:  Inguinal hernia, mid abdominal pain.  EXAM: ABDOMEN - 2 VIEW  COMPARISON:  CT 05/16/2015  FINDINGS: Oral contrast material seen within  the colon. Mild diffuse gaseous distention of bowel. No evidence of bowel obstruction. No free air. No organomegaly.  IMPRESSION: Mild diffuse gaseous distention of bowel, likely mild ileus. No evidence of bowel obstruction or free air.   Electronically Signed   By: Charlett Nose M.D.   On: 05/28/15 12:06    Anti-infectives: Anti-infectives    Start     Dose/Rate Route Frequency Ordered Stop   05/20/15 0600  ciprofloxacin (CIPRO) IVPB 400 mg     400 mg 200 mL/hr over 60 Minutes Intravenous On call to O.R. 05/19/15 4098 05/21/15 0559       Assessment/Plan  1. B inguinal hernias, incarcerated but not strangulated -if cleared by cards today, would plan for repair of both hernias tomorrow in OR -NPO p MN in preparation  2. A fib -not on anticoagulant due to fall risk     Nasario Czerniak E 05/19/2015, 9:22 AM Pager: [phone]

## 2015-05-19 NOTE — Progress Notes (Signed)
Physical Therapy Treatment Patient Details Name: Luis Norton MRN: [id number] DOB: 07-Aug-1922 Today's Date: 05/19/2015    History of Present Illness Luis Norton is a 81 y.o. male with a past medical history of atrial fibrillation, BPH, hypertension, hyperlipidemia, erectile dysfunction, constipation, PUD who came into the emergency department due to outflow chief complaint. Patient has a history of bilateral inguinal hernias which occasionally given pain.  Admitted due to hernia pain, nausea and a-fib w/RVR.    PT Comments    Pt was able to complete the Berg Balance test today which revealed significant risk for falls and would indicate he would be safer to use an assistive device with gait. Pt is reluctant, but agreeable to use an AD, especially with impending hernia surgery tomorrow.  PT will continue to follow acutely.   Follow Up Recommendations  Home health PT     Equipment Recommendations  Rolling walker with 5" wheels    Recommendations for Other Services   NA     Precautions / Restrictions Precautions Precautions: Fall Precaution Comments: significant fall risk based on Berg    Mobility  Bed Mobility Overal bed mobility: Modified Independent             General bed mobility comments: extra time needed and heavy reliance on hands to pull up to sitting  Transfers Overall transfer level: Needs assistance Equipment used: None Transfers: Sit to/from [employer] Sit to Stand: Supervision Stand pivot transfers: Supervision       General transfer comment: pt relies on hands for transitions as well as lower leg support againts chair/bed  Ambulation/Gait Ambulation/Gait assistance: Supervision Ambulation Distance (Feet): 20 Feet Assistive device: None Gait Pattern/deviations: Step-through pattern;Trendelenburg;Staggering left;Staggering right;Narrow base of support     General Gait Details: mildly staggering gait pattern, reaching for support  with one hand throughout gait in room.           Balance Overall balance assessment: Needs assistance Sitting-balance support: Feet supported;No upper extremity supported Sitting balance-Leahy Scale: Good     Standing balance support: No upper extremity supported Standing balance-Leahy Scale: Good                      Cognition Arousal/Alertness: Awake/alert Behavior During Therapy: WFL for tasks assessed/performed Overall Cognitive Status: History of cognitive impairments - at baseline       Memory: Decreased short-term memory (memory deficits per pt and family)                     Pertinent Vitals/Pain Pain Assessment: No/denies pain           PT Goals (current goals can now be found in the care plan section) Acute Rehab PT Goals Patient Stated Goal: To go home Progress towards PT goals: Progressing toward goals    Frequency  Min 3X/week    PT Plan Current plan remains appropriate       End of Session Equipment Utilized During Treatment: Gait belt Activity Tolerance: Patient tolerated treatment well Patient left: in chair;with call bell/phone within reach;with chair alarm set;with family/visitor present     Time: 8295-6213 PT Time Calculation (min) (ACUTE ONLY): 35 min  Charges:  $Neuromuscular Re-education: 23-37 mins                      Jonhatan Hearty B. Cedarius Kersh, PT, DPT [phone]   05/19/2015, 2:27 PM

## 2015-05-19 NOTE — Progress Notes (Signed)
Patient ID: Luis Norton, male   DOB: [birth year], 81 y.o.   MRN: [id number]  Plan open repair of bilateral inguinal hernia with mesh tomorrow. I have discussed the risks with him which include but are not limited to bleeding, infection, injury to surrounding structures, recurrence, cardiopulmonary problems, etc.

## 2015-05-20 ENCOUNTER — Observation Stay (HOSPITAL_COMMUNITY): Payer: Medicare Other | Admitting: Certified Registered Nurse Anesthetist

## 2015-05-20 ENCOUNTER — Encounter (HOSPITAL_COMMUNITY): Payer: Self-pay | Admitting: Certified Registered Nurse Anesthetist

## 2015-05-20 ENCOUNTER — Encounter (HOSPITAL_COMMUNITY)
Admission: EM | Disposition: A | Discharge status: Home / Self Care | Payer: Self-pay | Source: Home / Self Care | Attending: Emergency Medicine

## 2015-05-20 DIAGNOSIS — K4020 Bilateral inguinal hernia, without obstruction or gangrene, not specified as recurrent: Secondary | ICD-10-CM | POA: Diagnosis not present

## 2015-05-20 DIAGNOSIS — K4021 Bilateral inguinal hernia, without obstruction or gangrene, recurrent: Secondary | ICD-10-CM | POA: Diagnosis not present

## 2015-05-20 DIAGNOSIS — I4891 Unspecified atrial fibrillation: Secondary | ICD-10-CM | POA: Diagnosis not present

## 2015-05-20 DIAGNOSIS — N400 Enlarged prostate without lower urinary tract symptoms: Secondary | ICD-10-CM | POA: Diagnosis not present

## 2015-05-20 DIAGNOSIS — E780 Pure hypercholesterolemia: Secondary | ICD-10-CM | POA: Diagnosis not present

## 2015-05-20 DIAGNOSIS — I10 Essential (primary) hypertension: Secondary | ICD-10-CM | POA: Diagnosis not present

## 2015-05-20 DIAGNOSIS — K4000 Bilateral inguinal hernia, with obstruction, without gangrene, not specified as recurrent: Secondary | ICD-10-CM | POA: Diagnosis not present

## 2015-05-20 HISTORY — PX: INGUINAL HERNIA REPAIR: SHX194

## 2015-05-20 HISTORY — PX: INSERTION OF MESH: SHX5868

## 2015-05-20 SURGERY — REPAIR, HERNIA, INGUINAL, BILATERAL, ADULT
Anesthesia: Monitor Anesthesia Care | Site: Groin | Laterality: Bilateral

## 2015-05-20 MED ORDER — ONDANSETRON HCL 4 MG/2ML IJ SOLN: 4.0000 mg | Freq: Once | INTRAMUSCULAR | Status: DC | PRN

## 2015-05-20 MED ORDER — FENTANYL CITRATE (PF) 250 MCG/5ML IJ SOLN
INTRAMUSCULAR | Status: AC
  Filled 2015-05-20: qty 5

## 2015-05-20 MED ORDER — BUPIVACAINE-EPINEPHRINE (PF) 0.25% -1:200000 IJ SOLN
INTRAMUSCULAR | Status: AC
  Filled 2015-05-20: qty 60

## 2015-05-20 MED ORDER — LACTATED RINGERS IV SOLN
INTRAVENOUS | Status: DC | PRN
  Administered 2015-05-20: 11:00:00 via INTRAVENOUS

## 2015-05-20 MED ORDER — MIDAZOLAM HCL 2 MG/2ML IJ SOLN
INTRAMUSCULAR | Status: AC
  Administered 2015-05-20: 1 mg
  Filled 2015-05-20: qty 2

## 2015-05-20 MED ORDER — LIDOCAINE HCL (PF) 1 % IJ SOLN
INTRAMUSCULAR | Status: AC
  Filled 2015-05-20: qty 30

## 2015-05-20 MED ORDER — PHENYLEPHRINE HCL 10 MG/ML IJ SOLN
INTRAMUSCULAR | Status: DC | PRN
  Administered 2015-05-20: 80 ug via INTRAVENOUS

## 2015-05-20 MED ORDER — LIDOCAINE HCL 1 % IJ SOLN
INTRAMUSCULAR | Status: DC | PRN
  Administered 2015-05-20: 20 mL

## 2015-05-20 MED ORDER — HYDROMORPHONE HCL 1 MG/ML IJ SOLN: 0.2500 mg | INTRAMUSCULAR | Status: DC | PRN

## 2015-05-20 MED ORDER — PROPOFOL 10 MG/ML IV BOLUS
INTRAVENOUS | Status: AC
  Filled 2015-05-20: qty 20

## 2015-05-20 MED ORDER — MEPERIDINE HCL 25 MG/ML IJ SOLN: 6.2500 mg | INTRAMUSCULAR | Status: DC | PRN

## 2015-05-20 MED ORDER — ONDANSETRON HCL 4 MG/2ML IJ SOLN
INTRAMUSCULAR | Status: AC
  Filled 2015-05-20: qty 2

## 2015-05-20 MED ORDER — PHENYLEPHRINE 40 MCG/ML (10ML) SYRINGE FOR IV PUSH (FOR BLOOD PRESSURE SUPPORT)
PREFILLED_SYRINGE | INTRAVENOUS | Status: AC
  Filled 2015-05-20: qty 10

## 2015-05-20 MED ORDER — MIDAZOLAM HCL 5 MG/ML IJ SOLN: 1.0000 mg | Freq: Once | INTRAMUSCULAR | Status: DC

## 2015-05-20 MED ORDER — FENTANYL CITRATE (PF) 100 MCG/2ML IJ SOLN
100.0000 ug | Freq: Once | INTRAMUSCULAR | Status: AC
  Administered 2015-05-20: 75 ug via INTRAVENOUS

## 2015-05-20 MED ORDER — MORPHINE SULFATE (PF) 2 MG/ML IV SOLN
1.0000 mg | INTRAVENOUS | Status: DC | PRN
  Administered 2015-05-20 – 2015-05-22 (×3): 2 mg via INTRAVENOUS
  Filled 2015-05-20 (×4): qty 1

## 2015-05-20 MED ORDER — FENTANYL CITRATE (PF) 100 MCG/2ML IJ SOLN
INTRAMUSCULAR | Status: DC | PRN
  Administered 2015-05-20 (×2): 25 ug via INTRAVENOUS

## 2015-05-20 MED ORDER — PROPOFOL INFUSION 10 MG/ML OPTIME
INTRAVENOUS | Status: DC | PRN
  Administered 2015-05-20: 25 ug/kg/min via INTRAVENOUS

## 2015-05-20 MED ORDER — FENTANYL CITRATE (PF) 100 MCG/2ML IJ SOLN
INTRAMUSCULAR | Status: AC
  Filled 2015-05-20: qty 2

## 2015-05-20 MED ORDER — PROPOFOL 10 MG/ML IV BOLUS
INTRAVENOUS | Status: DC | PRN
  Administered 2015-05-20: 100 mg via INTRAVENOUS

## 2015-05-20 MED ORDER — 0.9 % SODIUM CHLORIDE (POUR BTL) OPTIME
TOPICAL | Status: DC | PRN
  Administered 2015-05-20: 1000 mL

## 2015-05-20 MED ORDER — LIDOCAINE HCL (CARDIAC) 20 MG/ML IV SOLN
INTRAVENOUS | Status: AC
Start: 2015-05-20 — End: 2015-05-20
  Filled 2015-05-20: qty 5

## 2015-05-20 MED ORDER — ONDANSETRON HCL 4 MG/2ML IJ SOLN
INTRAMUSCULAR | Status: DC | PRN
Start: 2015-05-20 — End: 2015-05-20
  Administered 2015-05-20: 4 mg via INTRAVENOUS

## 2015-05-20 MED ORDER — LACTATED RINGERS IV SOLN
INTRAVENOUS | Status: DC
  Administered 2015-05-20: 11:00:00 via INTRAVENOUS

## 2015-05-20 SURGICAL SUPPLY — 45 items
BLADE SURG ROTATE 9660 (MISCELLANEOUS) IMPLANT
CHLORAPREP W/TINT 26ML (MISCELLANEOUS) ×3 IMPLANT
COVER SURGICAL LIGHT HANDLE (MISCELLANEOUS) ×3 IMPLANT
DRAIN PENROSE 1/2X12 LTX STRL (WOUND CARE) IMPLANT
DRAPE LAPAROTOMY TRNSV 102X78 (DRAPE) ×3 IMPLANT
DRAPE UTILITY XL STRL (DRAPES) ×6 IMPLANT
DRESSING TELFA 8X3 (GAUZE/BANDAGES/DRESSINGS) ×3 IMPLANT
DRSG TEGADERM 4X4.75 (GAUZE/BANDAGES/DRESSINGS) ×3 IMPLANT
ELECT CAUTERY BLADE 6.4 (BLADE) ×3 IMPLANT
ELECT REM PT RETURN 9FT ADLT (ELECTROSURGICAL) ×3
ELECTRODE REM PT RTRN 9FT ADLT (ELECTROSURGICAL) ×1 IMPLANT
GLOVE BIOGEL PI IND STRL 7.0 (GLOVE) IMPLANT
GLOVE BIOGEL PI IND STRL 7.5 (GLOVE) IMPLANT
GLOVE BIOGEL PI INDICATOR 7.0 (GLOVE) ×2
GLOVE BIOGEL PI INDICATOR 7.5 (GLOVE) ×2
GLOVE SURG SIGNA 7.5 PF LTX (GLOVE) ×3 IMPLANT
GLOVE SURG SS PI 7.0 STRL IVOR (GLOVE) ×2 IMPLANT
GLOVE SURG SS PI 7.5 STRL IVOR (GLOVE) ×2 IMPLANT
GOWN STRL REUS W/ TWL LRG LVL3 (GOWN DISPOSABLE) ×1 IMPLANT
GOWN STRL REUS W/ TWL XL LVL3 (GOWN DISPOSABLE) ×1 IMPLANT
GOWN STRL REUS W/TWL LRG LVL3 (GOWN DISPOSABLE) ×3
GOWN STRL REUS W/TWL XL LVL3 (GOWN DISPOSABLE) ×3
KIT BASIN OR (CUSTOM PROCEDURE TRAY) ×3 IMPLANT
KIT ROOM TURNOVER OR (KITS) ×3 IMPLANT
LIQUID BAND (GAUZE/BANDAGES/DRESSINGS) ×3 IMPLANT
MESH PARIETEX PROGRIP LEFT (Mesh General) ×2 IMPLANT
MESH PARIETEX PROGRIP RIGHT (Mesh General) ×2 IMPLANT
NDL HYPO 25GX1X1/2 BEV (NEEDLE) ×1 IMPLANT
NEEDLE HYPO 25GX1X1/2 BEV (NEEDLE) ×3 IMPLANT
NS IRRIG 1000ML POUR BTL (IV SOLUTION) ×3 IMPLANT
PACK SURGICAL SETUP 50X90 (CUSTOM PROCEDURE TRAY) ×3 IMPLANT
PAD ARMBOARD 7.5X6 YLW CONV (MISCELLANEOUS) ×6 IMPLANT
PENCIL BUTTON HOLSTER BLD 10FT (ELECTRODE) ×3 IMPLANT
SPECIMEN JAR SMALL (MISCELLANEOUS) IMPLANT
SPONGE LAP 18X18 X RAY DECT (DISPOSABLE) ×3 IMPLANT
SUT MON AB 4-0 PC3 18 (SUTURE) ×3 IMPLANT
SUT SILK 2 0 SH (SUTURE) IMPLANT
SUT VIC AB 2-0 CT1 27 (SUTURE) ×6
SUT VIC AB 2-0 CT1 TAPERPNT 27 (SUTURE) ×2 IMPLANT
SUT VIC AB 3-0 CT1 27 (SUTURE) ×3
SUT VIC AB 3-0 CT1 TAPERPNT 27 (SUTURE) ×1 IMPLANT
SYR CONTROL 10ML LL (SYRINGE) ×3 IMPLANT
TOWEL OR 17X24 6PK STRL BLUE (TOWEL DISPOSABLE) ×3 IMPLANT
TOWEL OR 17X26 10 PK STRL BLUE (TOWEL DISPOSABLE) ×3 IMPLANT
WATER STERILE IRR 1000ML POUR (IV SOLUTION) IMPLANT

## 2015-05-20 NOTE — Anesthesia Preprocedure Evaluation (Addendum)
Anesthesia Evaluation  Patient identified by MRN, date of birth, ID band Patient awake    Reviewed: Allergy & Precautions, NPO status , Patient's Chart, lab work & pertinent test results  Airway Mallampati: II  TM Distance: >3 FB Neck ROM: Full    Dental  (+) Dental Advisory Given, Edentulous Upper, Edentulous Lower   Pulmonary former smoker,    + decreased breath sounds      Cardiovascular hypertension, Pt. on medications and Pt. on home beta blockers + dysrhythmias Atrial Fibrillation Rhythm:Regular Rate:Normal - Systolic murmurs    Neuro/Psych    GI/Hepatic PUD,   Endo/Other    Renal/GU      Musculoskeletal   Abdominal   Peds  Hematology   Anesthesia Other Findings   Reproductive/Obstetrics                       Anesthesia Physical Anesthesia Plan  ASA: III  Anesthesia Plan: MAC   Post-op Pain Management: GA combined w/ Regional for post-op pain   Induction: Intravenous  Airway Management Planned:   Additional Equipment:   Intra-op Plan:   Post-operative Plan:   Informed Consent: I have reviewed the patients History and Physical, chart, labs and discussed the procedure including the risks, benefits and alternatives for the proposed anesthesia with the patient or authorized representative who has indicated his/her understanding and acceptance.   Dental advisory given  Plan Discussed with: CRNA, Anesthesiologist and Surgeon  Anesthesia Plan Comments: (MAc with bilateral TAP blocks. Discussed with Pt and Dr Magnus Ivan.)      Anesthesia Quick Evaluation

## 2015-05-20 NOTE — Progress Notes (Signed)
Patient Name: Luis Norton Date of Encounter: 05/20/2015   SUBJECTIVE  No chest pain or sob. Only complain of abdominal pain.   CURRENT MEDS . aspirin EC  81 mg Oral Daily  . ciprofloxacin  400 mg Intravenous To SS-Surg  . enoxaparin (LOVENOX) injection  40 mg Subcutaneous Q24H  . metoprolol succinate  25 mg Oral Daily  . polyethylene glycol  17 g Oral Daily  . sodium chloride  3 mL Intravenous Q12H    OBJECTIVE  Filed Vitals:   05/19/15 0429 05/19/15 1731 05/19/15 2027 05/20/15 0443  BP: 134/60 125/40 146/60 128/71  Pulse: 66   72  Temp: 98 F (36.7 C) 98.6 F (37 C) 98 F (36.7 C) 98.9 F (37.2 C)  TempSrc: Axillary Oral Oral Oral  Resp:  18    Height:      Weight: 132 lb 3.2 oz (59.966 kg)   125 lb 3.2 oz (56.79 kg)  SpO2: 98% 98% 97% 100%    Intake/Output Summary (Last 24 hours) at 05/20/15 0805 Last data filed at 05/19/15 2243  Gross per 24 hour  Intake    360 ml  Output    325 ml  Net     35 ml   Filed Weights   05/18/15 0446 05/19/15 0429 05/20/15 0443  Weight: 126 lb (57.153 kg) 132 lb 3.2 oz (59.966 kg) 125 lb 3.2 oz (56.79 kg)    PHYSICAL EXAM  General: Pleasant, NAD. Neuro: Alert and oriented X 3. Moves all extremities spontaneously. Psych: Normal affect. HEENT:  Normal  Neck: Supple without bruits or JVD. Lungs:  Resp regular and unlabored, CTA. Heart: RRR no s3, s4, or murmurs. Abdomen: Soft, diffusely tender, BS + x 4.  Extremities: No clubbing, cyanosis or edema. DP/PT/Radials 2+ and equal bilaterally.  Accessory Clinical Findings  CBC  Recent Labs  05/17/15 1406  WBC 8.9  NEUTROABS 6.1  HGB 12.5*  HCT 37.6*  MCV 83.0  PLT 391   Basic Metabolic Panel  Recent Labs  05/17/15 1406  NA 138  K 4.6  CL 102  CO2 27  GLUCOSE 100*  BUN 21*  CREATININE 0.95  CALCIUM 9.2   ECHO: - Left ventricle: The cavity size was normal. There was mild concentric hypertrophy. Systolic function was mildly reduced. The estimated  ejection fraction was in the range of 45% to 50%. There is akinesis of the basalinferior myocardium. Doppler parameters are consistent with abnormal left ventricular relaxation (grade 1 diastolic dysfunction). - Aortic valve: Valve mobility was restricted.  TELE  NSR 60-70s.   ASSESSMENT AND PLAN  1. Atrial fibrillation with RVR - Run of afib with AVR in the ER and then another episode of atrial flutter with RVR after admission -Converted spontaneously to NSR.  - Maintaining sinus rhythm with controlled rate of 60-70s. - No anticoagulation due to fall risk. Continue BB and ASA.  2. Essential hypertension - stable and well controlled.    3. Bilateral inguinal hernia: Surgery today. No further cardiac testing.   4. Cardiomyopathy - Echo showed LV ef of 45-50%, grade 1 DD. No signs of volume overload. Continue current therapy.    Signed, Bhagat,Bhavinkumar PA-C Pager [phone]  History and all data above reviewed.  Patient examined.  I agree with the findings as above.  No chest pain.  No SOB.  The patient exam reveals COR:RRR  ,  Lungs: Clear  ,  Abd: Positive bowel sounds, no rebound no guarding, Ext No edema  .  All available labs, radiology testing, previous records reviewed. Agree with documented assessment and plan. Preop:  See note from yesterday.  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  He has no active symptoms.  He is at risk for further atrial fib.  However, he and his family understand and accept this risk.    Fayrene Fearing [facility]  9:54 AM  05/20/2015

## 2015-05-20 NOTE — Anesthesia Postprocedure Evaluation (Signed)
Anesthesia Post Note  Patient: Luis Norton  Procedure(s) Performed: Procedure(s) (LRB): HERNIA REPAIR INGUINAL ADULT BILATERAL (Bilateral) INSERTION OF MESH (Bilateral)  Anesthesia type: General  Patient location: PACU  Post pain: Pain level controlled  Post assessment: Post-op Vital signs reviewed  Last Vitals: BP 159/69 mmHg  Pulse 63  Temp(Src) 36.7 C (Oral)  Resp 14  Ht  (1.676 m)  Wt 125 lb 3.2 oz (56.79 kg)  BMI 20.22 kg/m2  SpO2 99%  Post vital signs: Reviewed  Level of consciousness: sedated  Complications: No apparent anesthesia complications

## 2015-05-20 NOTE — Anesthesia Postprocedure Evaluation (Signed)
Anesthesia Post Note  Patient: Luis Norton  Procedure(s) Performed: Procedure(s) (LRB): HERNIA REPAIR INGUINAL ADULT BILATERAL (Bilateral) INSERTION OF MESH (Bilateral)  Anesthesia type: General  Patient location: PACU  Post pain: Pain level controlled  Post assessment: Post-op Vital signs reviewed  Last Vitals: BP 159/69 mmHg  Pulse 63  Temp(Src) 36.7 C (Oral)  Resp 14  Ht 5' 6" (1.676 m)  Wt 125 lb 3.2 oz (56.79 kg)  BMI 20.22 kg/m2  SpO2 99%  Post vital signs: Reviewed  Level of consciousness: sedated  Complications: No apparent anesthesia complications  

## 2015-05-20 NOTE — Transfer of Care (Signed)
Immediate Anesthesia Transfer of Care Note  Patient: Luis Norton  Procedure(s) Performed: Procedure(s): HERNIA REPAIR INGUINAL ADULT BILATERAL (Bilateral) INSERTION OF MESH (Bilateral)  Patient Location: PACU  Anesthesia Type:General and Regional  Level of Consciousness: sedated and lethargic  Airway & Oxygen Therapy: Patient Spontanous Breathing and Patient connected to nasal cannula oxygen  Post-op Assessment: Report given to RN and Post -op Vital signs reviewed and stable  Post vital signs: Reviewed and stable  Last Vitals:  Filed Vitals:   05/20/15 0443  BP: 128/71  Pulse: 72  Temp: 37.2 C  Resp:     Complications: No apparent anesthesia complications

## 2015-05-20 NOTE — Op Note (Addendum)
HERNIA REPAIR INGUINAL ADULT BILATERAL, INSERTION OF MESH  Procedure Note  Luis Norton 05/16/2015 - 05/20/2015   Pre-op Diagnosis: bilateral inguinal hernias     Post-op Diagnosis: same  Procedure(s): HERNIA REPAIR INGUINAL ADULT BILATERAL INSERTION OF MESH  Surgeon(s): Abigail Miyamoto, MD  Anesthesia: Choice  Staff:  Circulator: Doy Mince, RN Scrub Person: Llana Aliment, CST; Rushdan M Islam, RN  Estimated Blood Loss: Minimal               Findings:  The patient was found to have bilateral indirect inguinal hernias with completely weakend inguinal floors.  Hernias were repaired with 2 separate pieces of Proceed Prolene, Pro-Grip mesh  Procedure in detail:  The patient was brought to the operating room and identified as the correct patient.  He was placed in a supine position on the operating table and general anesthesia was induced. His abdomen was then prepped and draped in the usual sterile fashion. I anesthetized the skin of both groins with lidocaine. I made 2 separate incisions with a scalpel into these both through Scarpa's fascia with electrocautery. Because the patient still seems slightly uncomfortable, anesthesia was switched to a LMA.  I dissected down through Scarpa's fascia and both incisions with the cautery. I then opened up the external oblique fascia on both sides for the internal ring.  I then controlled both testicular cords with Penrose drains.  The floor of the inguinal canal on both sides was completely weekend. I separated both hernia sacs from the cord structures. I open both sacs to assure all contents were reduced into the abdominal cavity. I then tied off the base of both sacs with a 2-0 silk suture. I then excised the redundant sac with the electrocautery. I next brought 2 separate pieces of proline mesh onto the field. I placed both as an on lay on the inguinal floor and then brought around the cord structures on both sides. I then secured both these  of mesh in place to the pubic tubercle with a 2.0 Vicryls suture.  Good coverage of the cord structures appear to be achieved. I then closed both external oblique fascia layers over the mesh with running 2.0 Vicryls sutures. Scarpa's fascia was then closed on both sides with interrupted 3.0 Vicryls sutures.  The skin incisions were then closed with 4. 0 Monocryl sutures. Skin glue was then applied. The patient tolerated procedure well. All the counts were correct at the end procedure. The patient was then extubated in the operating room and taken in a stable condition to the recovery room.          Luis Norton A   Date: 05/20/2015  Time: 1:03 PM

## 2015-05-20 NOTE — Anesthesia Procedure Notes (Addendum)
Anesthesia Regional Block:  TAP block  Pre-Anesthetic Checklist: ,, timeout performed, Correct Patient, Correct Site, Correct Laterality, Correct Procedure, Correct Position, site marked, Risks and benefits discussed,  Surgical consent,  Pre-op evaluation,  At surgeon's request and post-op pain management  Laterality: Right and Left  Prep: chloraprep and alcohol swabs       Needles:  Injection technique: Single-shot  Needle Type: Echogenic Stimulator Needle     Needle Length: 9cm 9 cm Needle Gauge: 21 and 21 G    Additional Needles:  Procedures: ultrasound guided (picture in chart) TAP block Narrative:  Start time: 05/20/2015 11:10 AM End time: 05/20/2015 11:30 AM Injection made incrementally with aspirations every 5 mL.  Performed by: Personally  Anesthesiologist: Arta Bruce  Additional Notes: Monitors applied. Patient sedated. Sterile prep and drape,hand hygiene and sterile gloves were used. Relevant anatomy identified.Needle position confirmed.Local anesthetic injected incrementally after negative aspiration. Local anesthetic spread visualized in Transversus Abdominus Plane. Vascular puncture avoided. No complications. Right and Left sides done sequentially. Images printed for medical record.The patient tolerated the procedure well.       Procedure Name: MAC Date/Time: 05/20/2015 12:06 PM Performed by: Rise Patience T Pre-anesthesia Checklist: Patient identified, Emergency Drugs available, Suction available and Patient being monitored Patient Re-evaluated:Patient Re-evaluated prior to inductionOxygen Delivery Method: Simple face mask Preoxygenation: Pre-oxygenation with 100% oxygen Intubation Type: IV induction Placement Confirmation: positive ETCO2 and breath sounds checked- equal and bilateral Dental Injury: Teeth and Oropharynx as per pre-operative assessment     Procedure Name: LMA Insertion Date/Time: 05/20/2015 12:18 PM Performed by: Rise Patience  T Pre-anesthesia Checklist: Patient being monitored, Suction available, Emergency Drugs available, Patient identified and Timeout performed Patient Re-evaluated:Patient Re-evaluated prior to inductionOxygen Delivery Method: Circle system utilized Preoxygenation: Pre-oxygenation with 100% oxygen Intubation Type: IV induction Ventilation: Mask ventilation without difficulty LMA: LMA inserted LMA Size: 4.0 Number of attempts: 1 Placement Confirmation: positive ETCO2 and breath sounds checked- equal and bilateral Tube secured with: Tape Dental Injury: Teeth and Oropharynx as per pre-operative assessment

## 2015-05-20 NOTE — Progress Notes (Signed)
Patient ID: Luis Norton, male   DOB: 1921-10-18, 81 y.o.   MRN: [id number] [facility] PROGRESS NOTE  Luis Norton WUJ:[id number] DOB: [birth year] DOA: 05/16/2015 PCP: Katy Apo, MD  Brief narrative:   Seen earlier today, denies any new complaints, no changes clinically. Getting ready for surgery later today.  Brief narrative:    81 y.o. male with a past medical history of atrial fibrillation, BPH, hypertension, hyperlipidemia who presented to [facility] ED with pain in the groin area with associated increasing swelling and bulging that has been going on for past 5 days prior to this admission. He also had nausea and vomiting over past 1-2 days. He saw his PCP he was able to reduce the hernia but pt decided to come to ED due to ongoing pain. In ED, pt had atrial fibrillation with RVR (HR in 140-150's). Cardiology and surgery have seen the pt in consultation.  Barrier to discharge: Evaluate after the hernia repair surgery.  Assessment/Plan:    Principal Problem: Groin pain in the setting on  Inguinal hernia - Appreciate surgery seeing the pt in consultation - Plan for surgery this week - Continue pain management efforts - Patient for bilateral inguinal hernia repair today.  Active Problems: Atrial fibrillation with RVR - CHA2DS-Vasc score of 3 (age, hypertension) - Rate controlled with metoprolol 25 mg daily - Continue daily aspirin; not on other AC due to risk of falls   Essential hypertension - Continue daily metoprolol  Preoperative risk stratification -Seen by cardiology, patient does not have CAD, ventricular arrhythmias, CVA/TIA, CHF, CKD or diabetes mellitus type 2. -Patient was not clear about his exercise tolerance, advanced age is not risk factor according to ACC/AHA guidelines. -Per cardiology recommendation, no further testing, proceed to surgery.  DVT Prophylaxis  - Lovenox subQ ordered    Code Status: Full.  Family Communication:  plan of care discussed  with the patient Disposition Plan: PT evaluation once pt able to participate; D/C after surgery    IV access:  Peripheral IV  Procedures and diagnostic studies:    Dg Chest 2 View 05/16/2015  No acute cardiopulmonary disease.  COPD.   Electronically Signed   By: Elberta Fortis M.D.   On: 05/16/2015 16:17   Ct Abdomen Pelvis W Contrast 05/16/2015  Large LEFT renal cyst.  Small bowel loops within large LEFT inguinal hernia without obstruction.  Appendix, cecum and terminal ileum within large RIGHT inguinal hernia without obstruction.  Marked prostatic enlargement.  Extensive atherosclerotic disease.   Electronically Signed   By: Ulyses Southward M.D.   On: 05/16/2015 17:19   Dg Abd 2 Views 05/17/2015  Mild diffuse gaseous distention of bowel, likely mild ileus. No evidence of bowel obstruction or free air.   Electronically Signed   By: Charlett Nose M.D.   On: 05/17/2015 12:06    Medical Consultants:  Cardiology Surgery  Other Consultants:  Physical therapy  IAnti-Infectives:   None    Donzella Carrol A, MD  [facility] Pager [phone]  Time spent in minutes: 25 minutes  If 7PM-7AM, please contact night-coverage [email or web address] Password [facility] 05/20/2015, 12:40 PM      HPI/Subjective: No acute overnight events. Patient reports feeling little better this am.   Objective: Filed Vitals:   05/19/15 0429 05/19/15 1731 05/19/15 2027 05/20/15 0443  BP: 134/60 125/40 146/60 128/71  Pulse: 66   72  Temp: 98 F (36.7 C) 98.6 F (37 C) 98 F (36.7 C) 98.9 F (37.2 C)  TempSrc: Axillary Oral Oral Oral  Resp:  18    Height:      Weight: 59.966 kg (132 lb 3.2 oz)   56.79 kg (125 lb 3.2 oz)  SpO2: 98% 98% 97% 100%    Intake/Output Summary (Last 24 hours) at 05/20/15 1240 Last data filed at 05/20/15 0800  Gross per 24 hour  Intake      0 ml  Output    425 ml  Net   -425 ml    Exam:   General:  Pt is alert, follows commands appropriately, not in acute  distress  Cardiovascular: irregular rhythm, S1/S2 appreciated   Respiratory: Clear to auscultation bilaterally, no wheezing, no crackles, no rhonchi  Abdomen: Soft, non tender, non distended, bowel sounds present  Extremities: No edema, pulses DP and PT palpable bilaterally  Neuro: Grossly nonfocal  Data Reviewed: Basic Metabolic Panel:  Recent Labs Lab 05/16/15 1147 05/16/15 2103 05/17/15 1406  NA 135  --  138  K 5.1  --  4.6  CL 100*  --  102  CO2 27  --  27  GLUCOSE 127*  --  100*  BUN 35*  --  21*  CREATININE 1.24  --  0.95  CALCIUM 9.5  --  9.2  MG  --  1.9  --    Liver Function Tests:  Recent Labs Lab 05/16/15 1147  AST 20  ALT 14*  ALKPHOS 92  BILITOT 0.6  PROT 6.9  ALBUMIN 3.2*   No results for input(s): LIPASE, AMYLASE in the last 168 hours. No results for input(s): AMMONIA in the last 168 hours. CBC:  Recent Labs Lab 05/16/15 1147 05/17/15 1406  WBC 10.1 8.9  NEUTROABS  --  6.1  HGB 12.2* 12.5*  HCT 36.3* 37.6*  MCV 81.6 83.0  PLT 367 391   Cardiac Enzymes:  Recent Labs Lab 05/16/15 1905  TROPONINI 0.03   BNP: Invalid input(s): POCBNP CBG: No results for input(s): GLUCAP in the last 168 hours.  No results found for this or any previous visit (from the past 240 hour(s)).   Scheduled Meds: . [MAR Hold] aspirin EC  81 mg Oral Daily  . [MAR Hold] enoxaparin (LOVENOX) injection  40 mg Subcutaneous Q24H  . [MAR Hold] metoprolol succinate  25 mg Oral Daily  . midazolam  1 mg Intravenous Once  . [MAR Hold] polyethylene glycol  17 g Oral Daily  . [MAR Hold] sodium chloride  3 mL Intravenous Q12H   Continuous Infusions: . lactated ringers 10 mL/hr at 05/20/15 1046

## 2015-05-20 NOTE — Progress Notes (Signed)
Patient ID: Luis Norton, male   DOB: 1922-02-09, 81 y.o.   MRN: [id number]  Other family in the room this morning. I again discussed the need for surgery for repair of the hernias with mesh and the risks. For OR today

## 2015-05-21 ENCOUNTER — Encounter (HOSPITAL_COMMUNITY): Payer: Self-pay | Admitting: Surgery

## 2015-05-21 DIAGNOSIS — N400 Enlarged prostate without lower urinary tract symptoms: Secondary | ICD-10-CM | POA: Diagnosis not present

## 2015-05-21 DIAGNOSIS — K4021 Bilateral inguinal hernia, without obstruction or gangrene, recurrent: Secondary | ICD-10-CM | POA: Diagnosis not present

## 2015-05-21 DIAGNOSIS — I4891 Unspecified atrial fibrillation: Secondary | ICD-10-CM | POA: Diagnosis not present

## 2015-05-21 DIAGNOSIS — I10 Essential (primary) hypertension: Secondary | ICD-10-CM | POA: Diagnosis not present

## 2015-05-21 DIAGNOSIS — R109 Unspecified abdominal pain: Secondary | ICD-10-CM | POA: Diagnosis not present

## 2015-05-21 DIAGNOSIS — N179 Acute kidney failure, unspecified: Secondary | ICD-10-CM | POA: Diagnosis not present

## 2015-05-21 LAB — CBC
HEMATOCRIT: 31.7 % — AB (ref 39.0–52.0)
Hemoglobin: 10.5 g/dL — ABNORMAL LOW (ref 13.0–17.0)
MCH: 27.8 pg (ref 26.0–34.0)
MCHC: 33.1 g/dL (ref 30.0–36.0)
MCV: 83.9 fL (ref 78.0–100.0)
PLATELETS: 355 10*3/uL (ref 150–400)
RBC: 3.78 MIL/uL — ABNORMAL LOW (ref 4.22–5.81)
RDW: 13.4 % (ref 11.5–15.5)
WBC: 9.8 10*3/uL (ref 4.0–10.5)

## 2015-05-21 LAB — BASIC METABOLIC PANEL
Anion gap: 6 (ref 5–15)
BUN: 13 mg/dL (ref 6–20)
CHLORIDE: 96 mmol/L — AB (ref 101–111)
CO2: 30 mmol/L (ref 22–32)
CREATININE: 1.05 mg/dL (ref 0.61–1.24)
Calcium: 8.4 mg/dL — ABNORMAL LOW (ref 8.9–10.3)
GFR calc Af Amer: >60 mL/min (ref >60)
GFR calc non Af Amer: >60 mL/min (ref >60)
GLUCOSE: 109 mg/dL — AB (ref 65–99)
Potassium: 4.4 mmol/L (ref 3.5–5.1)
SODIUM: 132 mmol/L — AB (ref 135–145)

## 2015-05-21 NOTE — Progress Notes (Signed)
Pt complaining of 10 out of 10 abdominal pain. Tramadol given with little relief. Awaiting IV access. Dr. Magnus Ivan notified. VSS. Will continue to monitor

## 2015-05-21 NOTE — Progress Notes (Deleted)
Discharge teaching and instructions reviewed with pt. VSS. Pt discharging home with girlfriend. 

## 2015-05-21 NOTE — Progress Notes (Signed)
         Subjective: + incisional pain  Objective: Vital signs in last 24 hours: Temp:  [97.5 F (36.4 C)-98.3 F (36.8 C)] 98 F (36.7 C) (09/01 0528) Pulse Rate:  [58-67] 67 (09/01 0528) Resp:  [11-19] 14 (08/31 1500) BP: (115-173)/(49-83) 115/49 mmHg (09/01 0528) SpO2:  [96 %-100 %] 98 % (09/01 0528) Weight change:  Last BM Date: 05/15/15 Intake/Output from previous day: -200 08/31 0701 - 09/01 0700 In: 0  Out: 200 [Urine:200] Intake/Output this shift:    PE: General:Pleasant affect, NAD, though with incisional pain, no chest pain Skin:Warm and dry, brisk capillary refill HEENT:normocephalic, sclera clear, mucus membranes moist Heart:S1S2 RRR without murmur, gallup, rub or click Lungs:clear without rales, rhonchi, or wheezes ZOX:WRUE, + tender with incision, + BS, do not palpate liver spleen or masses Ext:no lower ext edema, 2+ pedal pulses, 2+ radial pulses Neuro:alert and oriented, MAE, follows commands, + facial symmetry Tele:  Maintaining SR   Lab Results:  Recent Labs  05/21/15 0537  WBC 9.8  HGB 10.5*  HCT 31.7*  PLT 355   BMET  Recent Labs  05/21/15 0537  NA 132*  K 4.4  CL 96*  CO2 30  GLUCOSE 109*  BUN 13  CREATININE 1.05  CALCIUM 8.4*    Studies/Results: No results found.  Medications: I have reviewed the patient's current medications. Scheduled Meds: . aspirin EC  81 mg Oral Daily  . enoxaparin (LOVENOX) injection  40 mg Subcutaneous Q24H  . metoprolol succinate  25 mg Oral Daily  . polyethylene glycol  17 g Oral Daily  . sodium chloride  3 mL Intravenous Q12H   Continuous Infusions: . lactated ringers 10 mL/hr at 05/20/15 1046   PRN Meds:.iohexol, lactulose, morphine injection, ondansetron **OR** ondansetron (ZOFRAN) IV, traMADol  Assessment/Plan: Post-op Day 1 of inguinal hernia repair/insertion of mesh    1. Atrial fibrillation with RVR- on admit - Run of afib with AVR in the ER and then another episode of atrial  flutter with RVR after admission -Converted spontaneously to NSR.  - Maintaining sinus rhythm with controlled rate of 60-70s. - No anticoagulation due to fall risk. Continue BB and ASA.  2. Essential hypertension - stable and well controlled.    3. Bilateral inguinal hernia: did well.    4. Cardiomyopathy - Echo showed LV ef of 45-50%, grade 1 DD. No signs of volume overload. Continue current therapy.   Time spent with pt. :15 minutes. [facility] R  Nurse Practitioner Certified Pager [phone] or after 5pm and on weekends call [phone] 05/21/2015, 8:20 AM   History and all data above reviewed.  Patient examined.  I agree with the findings as above.  No chest pain or palpitations The patient exam reveals COR:RRR  ,  Lungs: Clear  ,  Abd: Positive bowel sounds, no rebound no guarding, Ext No edema  .  All available labs, radiology testing, previous records reviewed. Agree with documented assessment and plan. Post op no evidence of ischemia or fib.  We will follow as needed.    Luis Norton  9:00 AM  05/21/2015

## 2015-05-21 NOTE — Progress Notes (Signed)
Physical Therapy Treatment Patient Details Name: Luis Norton MRN: [id number] DOB: [birth year] Today's Date: 05/21/2015    History of Present Illness Luis Norton is a 81 y.o. male with a past medical history of atrial fibrillation, BPH, hypertension, hyperlipidemia, erectile dysfunction, constipation, PUD who came into the emergency department due to outflow chief complaint. Patient has a history of bilateral inguinal hernias which occasionally given pain.  Admitted due to hernia pain, nausea and a-fib w/RVR.    PT Comments    Pt with increased pain today s/p surgical procedure yesterday for hernia repair (per MD note, PT and mobility OK) but motivated to get up and OOB with focus on pt wanting to get dressed for the day. Pt demonstrating decreased memory and recall about family members bringing him items and tends to perseverate on finding specific items in the room that this PT was unable to locate (ie. His underwear and shoe horn)/ Focused on simulating functional household mobility with use of RW for gait and balance in the room and requires close S/steady A with cues for safe placement of RW when approaching obstacles. Pt declined further gait due to incisional pain today. Planning to d/c home with family to provide 24/7 assist.   Follow Up Recommendations  Home health PT;Supervision/Assistance - 24 hour     Equipment Recommendations  Rolling walker with 5" wheels    Recommendations for Other Services       Precautions / Restrictions Precautions Precautions: Fall Precaution Comments: significant fall risk based on Berg Restrictions Weight Bearing Restrictions: No    Mobility  Bed Mobility Overal bed mobility: Modified Independent             General bed mobility comments: Pt already sat himself up at EOB  Transfers Overall transfer level: Needs assistance Equipment used: Rolling walker (2 wheeled) Transfers: Sit to/from Stand Sit to Stand: Supervision Stand pivot  transfers: Supervision       General transfer comment: Supervision for safety and cues to reach back before sitting in recliner. Pt using RW and decreased ability to problem solve placement through obstacles  Ambulation/Gait Ambulation/Gait assistance: Min guard Ambulation Distance (Feet): 20 Feet Assistive device: Rolling walker (2 wheeled) Gait Pattern/deviations: Narrow base of support;Step-through pattern     General Gait Details: Pt maintains stiff posture and performed household gait within room gathering clothing items and moving items in search of his shoe horn. Pt demonstrates decreased awareness and use of RW through obstacles.   Stairs            Wheelchair Mobility    Modified Rankin (Stroke Patients Only)       Balance Overall balance assessment: Needs assistance Sitting-balance support: No upper extremity supported;Feet supported Sitting balance-Leahy Scale: Good Sitting balance - Comments: performed dynamic sitting balance EOB during functional dressing tasks   Standing balance support: Single extremity supported;No upper extremity supported;During functional activity Standing balance-Leahy Scale: Good Standing balance comment: Dynamic standing balance assessed during functional dressing tasks in standing as well as picking up various items throughout room simulating household environment (pt searching for his personal shoe horn which was not located).                    Cognition Arousal/Alertness: Awake/alert Behavior During Therapy: WFL for tasks assessed/performed Overall Cognitive Status: History of cognitive impairments - at baseline       Memory: Decreased short-term memory (per chart, fam reports memory deficits)  Exercises      General Comments        Pertinent Vitals/Pain Pain Assessment: Faces Faces Pain Scale: Hurts little more Pain Location: incisional site Pain Descriptors / Indicators: Sore Pain  Intervention(s): Limited activity within patient's tolerance;Monitored during session;Premedicated before session    Home Living                      Prior Function            PT Goals (current goals can now be found in the care plan section) Acute Rehab PT Goals Patient Stated Goal: get some clothes on PT Goal Formulation: With patient Time For Goal Achievement: 06/01/15 Potential to Achieve Goals: Good Progress towards PT goals: Progressing toward goals    Frequency  Min 3X/week    PT Plan Current plan remains appropriate    Co-evaluation             End of Session   Activity Tolerance: Patient tolerated treatment well Patient left: in chair;with call bell/phone within reach;with chair alarm set     Time: 1610-9604 PT Time Calculation (min) (ACUTE ONLY): 30 min  Charges:  $Neuromuscular Re-education: 23-37 mins                    G Codes:      Delorise Royals, PT, DPT Pager #: [phone]  05/21/2015, 10:04 AM

## 2015-05-21 NOTE — Progress Notes (Signed)
1 Day Post-Op  Subjective: Arouses easily Complains of incisional pain  Objective: Vital signs in last 24 hours: Temp:  [97.5 F (36.4 C)-98.3 F (36.8 C)] 98 F (36.7 C) (09/01 0528) Pulse Rate:  [58-67] 67 (09/01 0528) Resp:  [11-19] 14 (08/31 1500) BP: (115-173)/(49-83) 115/49 mmHg (09/01 0528) SpO2:  [96 %-100 %] 98 % (09/01 0528) Last BM Date: 05/15/15  Intake/Output from previous day: 08/31 0701 - 09/01 0700 In: 0  Out: 200 [Urine:200] Intake/Output this shift:    Abdomen soft, incisions clean and dry  Lab Results:   Recent Labs  05/21/15 0537  WBC 9.8  HGB 10.5*  HCT 31.7*  PLT 355   BMET  Recent Labs  05/21/15 0537  NA 132*  K 4.4  CL 96*  CO2 30  GLUCOSE 109*  BUN 13  CREATININE 1.05  CALCIUM 8.4*   PT/INR No results for input(s): LABPROT, INR in the last 72 hours. ABG No results for input(s): PHART, HCO3 in the last 72 hours.  Invalid input(s): PCO2, PO2  Studies/Results: No results found.  Anti-infectives: Anti-infectives    Start     Dose/Rate Route Frequency Ordered Stop   05/20/15 0800  ciprofloxacin (CIPRO) IVPB 400 mg     400 mg 200 mL/hr over 60 Minutes Intravenous To ShortStay Surgical 05/19/15 1014 05/20/15 1220   05/20/15 0600  ciprofloxacin (CIPRO) IVPB 400 mg  Status:  Discontinued     400 mg 200 mL/hr over 60 Minutes Intravenous On call to O.R. 05/19/15 0922 05/19/15 1015      Assessment/Plan: s/p Procedure(s): HERNIA REPAIR INGUINAL ADULT BILATERAL (Bilateral) INSERTION OF MESH (Bilateral)  Ok to get out of bed and work with PT from surgical standpoint Continue pain management     Malaina Mortellaro A 05/21/2015

## 2015-05-21 NOTE — Progress Notes (Signed)
Patient ID: Luis Norton, male   DOB: 08-18-1922, 81 y.o.   MRN: [id number] [facility] PROGRESS NOTE  Luis Norton WUJ:[id number] DOB: 1922/08/12 DOA: 05/16/2015 PCP: Katy Apo, MD  Brief narrative:   Hand surgery yesterday, complained about incisional pain. Tramadol controlled the pain, seen by PT.   Brief narrative:    81 y.o. male with Norton past medical history of atrial fibrillation, BPH, hypertension, hyperlipidemia who presented to [facility] ED with pain in the groin area with associated increasing swelling and bulging that has been going on for past 5 days prior to this admission. He also had nausea and vomiting over past 1-2 days. He saw his PCP he was able to reduce the hernia but pt decided to come to ED due to ongoing pain. In ED, pt had atrial fibrillation with RVR (HR in 140-150's). Cardiology and surgery have seen the pt in consultation.  Barrier to discharge: Probable discharge in the morning.  Assessment/Plan:    Principal Problem: Groin pain in the setting on  Inguinal hernia - Appreciate surgery seeing the pt in consultation - Plan for surgery this week - Continue pain management efforts - Status post bilateral inguinal hernia repair done on 8/31 2016  Active Problems: Atrial fibrillation with RVR - CHA2DS-Vasc score of 3 (age, hypertension) - Rate controlled with metoprolol 25 mg daily - Continue daily aspirin; not on other AC due to risk of falls   Essential hypertension - Continue daily metoprolol  Preoperative risk stratification -Seen by cardiology, patient does not have CAD, ventricular arrhythmias, CVA/TIA, CHF, CKD or diabetes mellitus type 2. -Patient was not clear about his exercise tolerance, advanced age is not risk factor according to ACC/AHA guidelines. -Surgical risk considered to be lower.   DVT Prophylaxis  - Lovenox subQ ordered    Code Status: Full.  Family Communication:  plan of care discussed with the patient Disposition Plan: PT  evaluation once pt able to participate; probable discharge in Norton.m.  IV access:  Peripheral IV  Procedures and diagnostic studies:    Dg Chest 2 View 05/16/2015  No acute cardiopulmonary disease.  COPD.   Electronically Signed   By: Elberta Fortis M.D.   On: 05/16/2015 16:17   Ct Abdomen Pelvis W Contrast 05/16/2015  Large LEFT renal cyst.  Small bowel loops within large LEFT inguinal hernia without obstruction.  Appendix, cecum and terminal ileum within large RIGHT inguinal hernia without obstruction.  Marked prostatic enlargement.  Extensive atherosclerotic disease.   Electronically Signed   By: Ulyses Southward M.D.   On: 05/16/2015 17:19   Dg Abd 2 Views 05/17/2015  Mild diffuse gaseous distention of bowel, likely mild ileus. No evidence of bowel obstruction or free air.   Electronically Signed   By: Charlett Nose M.D.   On: 05/17/2015 12:06    Medical Consultants:  Cardiology Surgery  Other Consultants:  Physical therapy  IAnti-Infectives:   None    Luis Gaymon A, MD  [facility] Pager [phone]  Time spent in minutes: 25 minutes  If 7PM-7AM, please contact night-coverage [email or web address] Password [facility] 05/21/2015, 11:32 AM      HPI/Subjective: No acute overnight events. Patient reports feeling little better this am.   Objective: Filed Vitals:   05/20/15 1515 05/20/15 1533 05/20/15 2126 05/21/15 0528  BP:  159/69 144/64 115/49  Pulse:   65 67  Temp:   98.3 F (36.8 C) 98 F (36.7 C)  TempSrc:   Oral Oral  Resp:      Height:  Weight:      SpO2: 99% 99% 96% 98%   No intake or output data in the 24 hours ending 05/21/15 1132  Exam:   General:  Pt is alert, follows commands appropriately, not in acute distress  Cardiovascular: irregular rhythm, S1/S2 appreciated   Respiratory: Clear to auscultation bilaterally, no wheezing, no crackles, no rhonchi  Abdomen: Soft, non tender, non distended, bowel sounds present  Extremities: No edema, pulses DP and PT  palpable bilaterally  Neuro: Grossly nonfocal  Data Reviewed: Basic Metabolic Panel:  Recent Labs Lab 05/16/15 1147 05/16/15 2103 05/17/15 1406 05/21/15 0537  NA 135  --  138 132*  K 5.1  --  4.6 4.4  CL 100*  --  102 96*  CO2 27  --  27 30  GLUCOSE 127*  --  100* 109*  BUN 35*  --  21* 13  CREATININE 1.24  --  0.95 1.05  CALCIUM 9.5  --  9.2 8.4*  MG  --  1.9  --   --    Liver Function Tests:  Recent Labs Lab 05/16/15 1147  AST 20  ALT 14*  ALKPHOS 92  BILITOT 0.6  PROT 6.9  ALBUMIN 3.2*   No results for input(s): LIPASE, AMYLASE in the last 168 hours. No results for input(s): AMMONIA in the last 168 hours. CBC:  Recent Labs Lab 05/16/15 1147 05/17/15 1406 05/21/15 0537  WBC 10.1 8.9 9.8  NEUTROABS  --  6.1  --   HGB 12.2* 12.5* 10.5*  HCT 36.3* 37.6* 31.7*  MCV 81.6 83.0 83.9  PLT 367 391 355   Cardiac Enzymes:  Recent Labs Lab 05/16/15 1905  TROPONINI 0.03   BNP: Invalid input(s): POCBNP CBG: No results for input(s): GLUCAP in the last 168 hours.  No results found for this or any previous visit (from the past 240 hour(s)).   Scheduled Meds: . aspirin EC  81 mg Oral Daily  . enoxaparin (LOVENOX) injection  40 mg Subcutaneous Q24H  . metoprolol succinate  25 mg Oral Daily  . polyethylene glycol  17 g Oral Daily  . sodium chloride  3 mL Intravenous Q12H   Continuous Infusions: . lactated ringers 10 mL/hr at 05/20/15 1046

## 2015-05-22 DIAGNOSIS — N400 Enlarged prostate without lower urinary tract symptoms: Secondary | ICD-10-CM | POA: Diagnosis not present

## 2015-05-22 DIAGNOSIS — K4021 Bilateral inguinal hernia, without obstruction or gangrene, recurrent: Secondary | ICD-10-CM | POA: Diagnosis not present

## 2015-05-22 DIAGNOSIS — I10 Essential (primary) hypertension: Secondary | ICD-10-CM | POA: Diagnosis not present

## 2015-05-22 DIAGNOSIS — I4891 Unspecified atrial fibrillation: Secondary | ICD-10-CM | POA: Diagnosis not present

## 2015-05-22 MED ORDER — ASPIRIN 81 MG PO TBEC: 81.0000 mg | DELAYED_RELEASE_TABLET | Freq: Every day | ORAL | Status: DC

## 2015-05-22 MED ORDER — METOPROLOL SUCCINATE ER 25 MG PO TB24: 25.0000 mg | ORAL_TABLET | Freq: Every day | ORAL | Status: DC

## 2015-05-22 MED ORDER — TRAMADOL HCL 50 MG PO TABS: 25.0000 mg | ORAL_TABLET | Freq: Four times a day (QID) | ORAL | Status: DC | PRN

## 2015-05-22 NOTE — Care Management Note (Signed)
Case Management Note  Patient Details  Name: Luis Norton MRN: [id number] Date of Birth: [birth year]  Subjective/Objective:   Pt admitted for Afib RVR s/p hernia repair. Pt lives with Niece and has 24 hr supervision.             Action/Plan: CM did speak with Niece [name] and she is agreeable to HHPT services with [facility]. SOC to begin within 24-48 hrs post d/c. No further needs from CM at this time.     Expected Discharge Date:                  Expected Discharge Plan:  Home w Home Health Services  In-House Referral:  NA  Discharge planning Services  CM Consult  Post Acute Care Choice:  Home Health Choice offered to:  [name] POA / Guardian  DME Arranged:  Walker rolling DME Agency:  [facility]  HH Arranged:  PT HH Agency:  [facility]  Status of Service:  Completed, signed off  Medicare Important Message Given:    Date Medicare IM Given:    Medicare IM give by:    Date Additional Medicare IM Given:    Additional Medicare Important Message give by:     If discussed at Long Length of Stay Meetings, dates discussed:    Additional Comments:  [name], RN 05/22/2015, 10:47 AM

## 2015-05-22 NOTE — Discharge Summary (Signed)
Physician Discharge Summary  Luis Norton ZOX:[id number] DOB: 10-Apr-1922 DOA: 05/16/2015  PCP: Katy Apo, MD  Admit date: 05/16/2015 Discharge date: 05/22/2015  Time spent: 40 minutes  Recommendations for Outpatient Follow-up:  1. Follow-up with primary care physician in one week. 2. Follow-up with Dr. Magnus Ivan in 3 weeks.  Discharge Diagnoses:  Principal Problem:   Atrial fibrillation with RVR Active Problems:   Hypercholesterolemia   Essential hypertension   Bilateral inguinal hernia   BPH (benign prostatic hyperplasia)   PUD (peptic ulcer disease)   Groin pain   Hernia of abdominal cavity   Discharge Condition: Stable  Diet recommendation: Heart healthy  Filed Weights   05/19/15 0429 05/20/15 0443 05/22/15 0554  Weight: 59.966 kg (132 lb 3.2 oz) 56.79 kg (125 lb 3.2 oz) 56.291 kg (124 lb 1.6 oz)    History of present illness:  Luis Norton is a 81 y.o. male with a past medical history of atrial fibrillation, BPH, hypertension, hyperlipidemia, erectile dysfunction, constipation, PUD who came into the emergency department due to outflow chief complaint. Patient has a history of bilateral inguinal hernias which occasionally given pain. He says that on this episode for the last 5 days or so he has been having increasing pain and swelling/bulging in his groin area. He complains of constipation for the last 3 days and also reports nausea and emesis which is triggered often by oral intake. He says that just today he saw his primary care provider and more able to push the hernia back in, but returns today with the pain, groin bulging and inability to push the hernia back in.  During his stay in the emergency department he had rapid ventricular response of atrial fibrillation with pulse in the 140s and 150s. His rate has been subsequently controlled. General surgery was consulted and the hernia was able to be pushed back in. He is currently in no acute distress and states that his  pain is a lot better now.  Hospital Course:   Groin pain in the setting on Inguinal hernia - Presented with groin pain, has had incarcerated but not strangulated bilateral indirect inguinal hernias. - Status post bilateral inguinal hernia repair done on 05/20/2015. - Discharged on tramadol for pain control, follow up with Dr. Magnus Ivan in 3 weeks as outpatient.  Atrial fibrillation with RVR - CHA2DS-Vasc score of 3 (age, hypertension) - Rate controlled with Toprol-XL 25 mg mg daily - Continue daily aspirin; not on other AC due to risk of falls   Essential hypertension - Continue daily metoprolol  Preoperative risk stratification -Seen by cardiology, patient does not have CAD, ventricular arrhythmias, CVA/TIA, CHF, CKD or diabetes mellitus type 2. -Patient was not clear about his exercise tolerance, advanced age is not risk factor according to ACC/AHA guidelines. -Surgical risk considered to be lower risk, surgery was done without further testing.   Procedures:  Bilateral indirect inguinal hernias repair done by Dr. Magnus Ivan on 05/20/15 using 2 separate pieces of Proceed Proline, Pro-grip mesh  Consultations:  Cardiology.  General surgery  Discharge Exam: Filed Vitals:   05/22/15 0554  BP: 126/64  Pulse: 65  Temp: 98.5 F (36.9 C)  Resp:   General: Alert and awake, oriented x3, not in any acute distress. HEENT: anicteric sclera, pupils reactive to light and accommodation, EOMI CVS: S1-S2 clear, no murmur rubs or gallops Chest: clear to auscultation bilaterally, no wheezing, rales or rhonchi Abdomen: soft nontender, nondistended, normal bowel sounds, no organomegaly Extremities: no cyanosis, clubbing or edema noted bilaterally Neuro:  Cranial nerves II-XII intact, no focal neurological deficits   Discharge Instructions   Discharge Instructions    Diet - low sodium heart healthy    Complete by:  As directed      Increase activity slowly    Complete by:  As directed            Current Discharge Medication List    START taking these medications   Details  aspirin EC 81 MG EC tablet Take 1 tablet (81 mg total) by mouth daily.    metoprolol succinate (TOPROL-XL) 25 MG 24 hr tablet Take 1 tablet (25 mg total) by mouth daily. Qty: 30 tablet, Refills: 0      CONTINUE these medications which have CHANGED   Details  traMADol (ULTRAM) 50 MG tablet Take 0.5-1 tablets (25-50 mg total) by mouth every 6 (six) hours as needed for severe pain. Qty: 30 tablet, Refills: 0      CONTINUE these medications which have NOT CHANGED   Details  bisacodyl (DULCOLAX) 5 MG EC tablet 1 tab q hs Prn constipation Qty: 14 tablet, Refills: 0       Allergies  Allergen Reactions  . Penicillins Swelling   Follow-up Information    Follow up with [facility], MD. Schedule an appointment as soon as possible for a visit in 3 weeks.   Specialty:  General Surgery   Contact information:   [address] [place] [address] [phone]        The results of significant diagnostics from this hospitalization (including imaging, microbiology, ancillary and laboratory) are listed below for reference.    Significant Diagnostic Studies: Dg Chest 2 View  05/16/2015   CLINICAL DATA:  Abdominal pain 4 days. Nausea and vomiting with constipation.  EXAM: CHEST  2 VIEW  COMPARISON:  03/18/2013 and 03/10/2013  FINDINGS: Lungs are hyperexpanded with flattening of the hemidiaphragms. There is no focal consolidation or effusion. There is borderline cardiomegaly. Calcified plaque is present over the thoracic aorta. Small nodular cluster over the lateral right midlung unchanged likely calcified granulomas. Remainder of the exam is unchanged.  IMPRESSION: No acute cardiopulmonary disease.  COPD.   Electronically Signed   By: Elberta Fortis M.D.   On: 05/16/2015 16:17   Ct Abdomen Pelvis W Contrast  05/16/2015   CLINICAL DATA:  Severe lower abdominal pain for 4 days,  constipation, history hypertension, former smoker, peptic ulcer disease  EXAM: CT ABDOMEN AND PELVIS WITH CONTRAST  TECHNIQUE: Multidetector CT imaging of the abdomen and pelvis was performed using the standard protocol following bolus administration of intravenous contrast. Sagittal and coronal MPR images reconstructed from axial data set.  CONTRAST:  OMNIPAQUE IOHEXOL 300 MG/ML SOLN IV. Dilute oral contrast.  COMPARISON:  09/29/2014  FINDINGS: Lung bases emphysematous but clear.  Large cyst arising from anterior aspect of mid LEFT kidney 7.5 x 5.4 x 7.1 cm.  Liver, spleen, pancreas, kidneys, and adrenal glands otherwise normal.  Stomach distended by contrast and food debris.  Appendix, cecum, and terminal ileum located within a large RIGHT inguinal hernia.  Numerous small bowel loops within a large LEFT inguinal hernia.  Bowel loops otherwise unremarkable without evidence of bowel obstruction.  Extensive atherosclerotic calcifications.  Marked prostatic enlargement, gland 6.5 x 4.9 x 6.3 cm.  No mass, adenopathy, free air or free fluid.  Osseous demineralization with degenerative disc and facet disease changes of the lumbar spine.  IMPRESSION: Large LEFT renal cyst.  Small bowel loops within large  LEFT inguinal hernia without obstruction.  Appendix, cecum and terminal ileum within large RIGHT inguinal hernia without obstruction.  Marked prostatic enlargement.  Extensive atherosclerotic disease.   Electronically Signed   By: Ulyses Southward M.D.   On: 05/16/2015 17:19   Dg Abd 2 Views  05/17/2015   CLINICAL DATA:  Inguinal hernia, mid abdominal pain.  EXAM: ABDOMEN - 2 VIEW  COMPARISON:  CT 05/16/2015  FINDINGS: Oral contrast material seen within the colon. Mild diffuse gaseous distention of bowel. No evidence of bowel obstruction. No free air. No organomegaly.  IMPRESSION: Mild diffuse gaseous distention of bowel, likely mild ileus. No evidence of bowel obstruction or free air.   Electronically Signed   By:  Charlett Nose M.D.   On: 05/17/2015 12:06    Microbiology: No results found for this or any previous visit (from the past 240 hour(s)).   Labs: Basic Metabolic Panel:  Recent Labs Lab 05/16/15 1147 05/16/15 2103 05/17/15 1406 05/21/15 0537  NA 135  --  138 132*  K 5.1  --  4.6 4.4  CL 100*  --  102 96*  CO2 27  --  27 30  GLUCOSE 127*  --  100* 109*  BUN 35*  --  21* 13  CREATININE 1.24  --  0.95 1.05  CALCIUM 9.5  --  9.2 8.4*  MG  --  1.9  --   --    Liver Function Tests:  Recent Labs Lab 05/16/15 1147  AST 20  ALT 14*  ALKPHOS 92  BILITOT 0.6  PROT 6.9  ALBUMIN 3.2*   No results for input(s): LIPASE, AMYLASE in the last 168 hours. No results for input(s): AMMONIA in the last 168 hours. CBC:  Recent Labs Lab 05/16/15 1147 05/17/15 1406 05/21/15 0537  WBC 10.1 8.9 9.8  NEUTROABS  --  6.1  --   HGB 12.2* 12.5* 10.5*  HCT 36.3* 37.6* 31.7*  MCV 81.6 83.0 83.9  PLT 367 391 355   Cardiac Enzymes:  Recent Labs Lab 05/16/15 1905  TROPONINI 0.03   BNP: BNP (last 3 results) No results for input(s): BNP in the last 8760 hours.  ProBNP (last 3 results) No results for input(s): PROBNP in the last 8760 hours.  CBG: No results for input(s): GLUCAP in the last 168 hours.     Signed:  Armya Westerhoff A  [facility] 05/22/2015, 10:30 AM

## 2015-05-22 NOTE — Progress Notes (Signed)
2 Days Post-Op  Subjective: Looks much better today Eating   Objective: Vital signs in last 24 hours: Temp:  [98.3 F (36.8 C)-98.8 F (37.1 C)] 98.5 F (36.9 C) (09/02 0554) Pulse Rate:  [65-73] 65 (09/02 0554) Resp:  [16] 16 (09/01 1425) BP: (119-133)/(53-69) 126/64 mmHg (09/02 0554) SpO2:  [93 %-100 %] 100 % (09/02 0554) Weight:  [56.291 kg (124 lb 1.6 oz)] 56.291 kg (124 lb 1.6 oz) (09/02 0554) Last BM Date:  (Pt said he had a very small ball yesterday )  Intake/Output from previous day: 09/01 0701 - 09/02 0700 In: 723 [P.O.:720; I.V.:3] Out: 25 [Urine:25] Intake/Output this shift: Total I/O In: 360 [P.O.:360] Out: -   Abdomen soft, non tender, incisions clean  Lab Results:   Recent Labs  05/21/15 0537  WBC 9.8  HGB 10.5*  HCT 31.7*  PLT 355   BMET  Recent Labs  05/21/15 0537  NA 132*  K 4.4  CL 96*  CO2 30  GLUCOSE 109*  BUN 13  CREATININE 1.05  CALCIUM 8.4*   PT/INR No results for input(s): LABPROT, INR in the last 72 hours. ABG No results for input(s): PHART, HCO3 in the last 72 hours.  Invalid input(s): PCO2, PO2  Studies/Results: No results found.  Anti-infectives: Anti-infectives    Start     Dose/Rate Route Frequency Ordered Stop   05/20/15 0800  ciprofloxacin (CIPRO) IVPB 400 mg     400 mg 200 mL/hr over 60 Minutes Intravenous To ShortStay Surgical 05/19/15 1014 05/20/15 1220   05/20/15 0600  ciprofloxacin (CIPRO) IVPB 400 mg  Status:  Discontinued     400 mg 200 mL/hr over 60 Minutes Intravenous On call to O.R. 05/19/15 0922 05/19/15 1015      Assessment/Plan: s/p Procedure(s): HERNIA REPAIR INGUINAL ADULT BILATERAL (Bilateral) INSERTION OF MESH (Bilateral)  Doing well from a surgery standpoint.  Will see for follow-up as an outpt in the office.     Airica Schwartzkopf A 05/22/2015

## 2015-05-22 NOTE — Discharge Instructions (Signed)
[facility] _______[facility]  UMBILICAL OR INGUINAL HERNIA REPAIR: POST OP INSTRUCTIONS  Always review your discharge instruction sheet given to you by the facility where your surgery was performed. IF YOU HAVE DISABILITY OR FAMILY LEAVE FORMS, YOU MUST BRING THEM TO THE OFFICE FOR PROCESSING.   DO NOT GIVE THEM TO YOUR DOCTOR.  1. A  prescription for pain medication may be given to you upon discharge.  Take your pain medication as prescribed, if needed.  If narcotic pain medicine is not needed, then you may take acetaminophen (Tylenol) or ibuprofen (Advil) as needed. 2. Take your usually prescribed medications unless otherwise directed. 3. If you need a refill on your pain medication, please contact your pharmacy.  They will contact our office to request authorization. Prescriptions will not be filled after 5 pm or on week-ends. 4. You should follow a light diet the first 24 hours after arrival home, such as soup and crackers, etc.  Be sure to include lots of fluids daily.  Resume your normal diet the day after surgery. 5. Most patients will experience some swelling and bruising around the umbilicus or in the groin and scrotum.  Ice packs and reclining will help.  Swelling and bruising can take several days to resolve.  6. It is common to experience some constipation if taking pain medication after surgery.  Increasing fluid intake and taking a stool softener (such as Colace) will usually help or prevent this problem from occurring.  A mild laxative (Milk of Magnesia or Miralax) should be taken according to package directions if there are no bowel movements after 48 hours. 7. Unless discharge instructions indicate otherwise, you may remove your bandages 24-48 hours after surgery, and you may shower at that time.  You may have steri-strips (small skin tapes) in place directly over the incision.  These strips should be left on the skin for 7-10 days.  If your surgeon used skin glue on the  incision, you may shower in 24 hours.  The glue will flake off over the next 2-3 weeks.  Any sutures or staples will be removed at the office during your follow-up visit. 8. ACTIVITIES:  You may resume regular (light) daily activities beginning the next day--such as daily self-care, walking, climbing stairs--gradually increasing activities as tolerated.  You may have sexual intercourse when it is comfortable.  Refrain from any heavy lifting or straining until approved by your doctor. a. You may drive when you are no longer taking prescription pain medication, you can comfortably wear a seatbelt, and you can safely maneuver your car and apply brakes. b. RETURN TO WORK:  __________________________________________________________ 9. You should see your doctor in the office for a follow-up appointment approximately 2-3 weeks after your surgery.  Make sure that you call for this appointment within a day or two after you arrive home to insure a convenient appointment time. 10. OTHER INSTRUCTIONS: OK TO SHOWER 11. NO LIFTING MORE THAN 15 POUNDS FOR 4 WEEKS __________________________________________________________________________________________________________________________________________________________________________________________  WHEN TO CALL YOUR DOCTOR: 1. Fever over 101.0 2. Inability to urinate 3. Nausea and/or vomiting 4. Extreme swelling or bruising 5. Continued bleeding from incision. 6. Increased pain, redness, or drainage from the incision  The clinic staff is available to answer your questions during regular business hours.  Please dont hesitate to call and ask to speak to one of the nurses for clinical concerns.  If you have a medical emergency, go to the nearest emergency room or call 911.  A surgeon from  [facility] is always on call at the hospital   [address], [place], [place]  [address] ?  [address] [address], [place], [place]   [address] [phone]  ? [phone] ? FAX [phone] Web site: [email or web address]

## 2015-05-24 ENCOUNTER — Inpatient Hospital Stay (HOSPITAL_COMMUNITY): Payer: Medicare Other

## 2015-05-24 ENCOUNTER — Encounter (HOSPITAL_COMMUNITY): Payer: Self-pay | Admitting: Nurse Practitioner

## 2015-05-24 ENCOUNTER — Emergency Department (HOSPITAL_COMMUNITY): Payer: Medicare Other

## 2015-05-24 ENCOUNTER — Inpatient Hospital Stay (HOSPITAL_COMMUNITY)
Admission: EM | Admit: 2015-05-24 | Discharge: 2015-05-28 | DRG: 683 | Disposition: A | Discharge status: Skilled Nursing Facility | Payer: Medicare Other | Attending: Internal Medicine | Admitting: Internal Medicine

## 2015-05-24 DIAGNOSIS — E869 Volume depletion, unspecified: Secondary | ICD-10-CM | POA: Diagnosis present

## 2015-05-24 DIAGNOSIS — R531 Weakness: Secondary | ICD-10-CM | POA: Diagnosis not present

## 2015-05-24 DIAGNOSIS — K913 Postprocedural intestinal obstruction: Secondary | ICD-10-CM | POA: Diagnosis present

## 2015-05-24 DIAGNOSIS — Z66 Do not resuscitate: Secondary | ICD-10-CM | POA: Diagnosis present

## 2015-05-24 DIAGNOSIS — K259 Gastric ulcer, unspecified as acute or chronic, without hemorrhage or perforation: Secondary | ICD-10-CM | POA: Diagnosis present

## 2015-05-24 DIAGNOSIS — Z87891 Personal history of nicotine dependence: Secondary | ICD-10-CM | POA: Diagnosis not present

## 2015-05-24 DIAGNOSIS — K279 Peptic ulcer, site unspecified, unspecified as acute or chronic, without hemorrhage or perforation: Secondary | ICD-10-CM

## 2015-05-24 DIAGNOSIS — K5900 Constipation, unspecified: Secondary | ICD-10-CM | POA: Diagnosis present

## 2015-05-24 DIAGNOSIS — K921 Melena: Secondary | ICD-10-CM | POA: Diagnosis present

## 2015-05-24 DIAGNOSIS — K922 Gastrointestinal hemorrhage, unspecified: Secondary | ICD-10-CM | POA: Diagnosis not present

## 2015-05-24 DIAGNOSIS — A048 Other specified bacterial intestinal infections: Secondary | ICD-10-CM | POA: Diagnosis present

## 2015-05-24 DIAGNOSIS — R109 Unspecified abdominal pain: Secondary | ICD-10-CM | POA: Diagnosis present

## 2015-05-24 DIAGNOSIS — A419 Sepsis, unspecified organism: Secondary | ICD-10-CM

## 2015-05-24 DIAGNOSIS — I480 Paroxysmal atrial fibrillation: Secondary | ICD-10-CM | POA: Diagnosis present

## 2015-05-24 DIAGNOSIS — D649 Anemia, unspecified: Secondary | ICD-10-CM | POA: Diagnosis present

## 2015-05-24 DIAGNOSIS — N400 Enlarged prostate without lower urinary tract symptoms: Secondary | ICD-10-CM | POA: Diagnosis present

## 2015-05-24 DIAGNOSIS — K449 Diaphragmatic hernia without obstruction or gangrene: Secondary | ICD-10-CM | POA: Diagnosis present

## 2015-05-24 DIAGNOSIS — K2210 Ulcer of esophagus without bleeding: Secondary | ICD-10-CM | POA: Diagnosis present

## 2015-05-24 DIAGNOSIS — Z7189 Other specified counseling: Secondary | ICD-10-CM | POA: Diagnosis not present

## 2015-05-24 DIAGNOSIS — Z7982 Long term (current) use of aspirin: Secondary | ICD-10-CM

## 2015-05-24 DIAGNOSIS — G8918 Other acute postprocedural pain: Secondary | ICD-10-CM | POA: Diagnosis present

## 2015-05-24 DIAGNOSIS — R1084 Generalized abdominal pain: Secondary | ICD-10-CM | POA: Diagnosis not present

## 2015-05-24 DIAGNOSIS — N179 Acute kidney failure, unspecified: Secondary | ICD-10-CM | POA: Diagnosis present

## 2015-05-24 DIAGNOSIS — I10 Essential (primary) hypertension: Secondary | ICD-10-CM | POA: Diagnosis present

## 2015-05-24 DIAGNOSIS — E785 Hyperlipidemia, unspecified: Secondary | ICD-10-CM | POA: Diagnosis present

## 2015-05-24 DIAGNOSIS — R1010 Upper abdominal pain, unspecified: Secondary | ICD-10-CM | POA: Diagnosis not present

## 2015-05-24 DIAGNOSIS — Z515 Encounter for palliative care: Secondary | ICD-10-CM | POA: Diagnosis not present

## 2015-05-24 DIAGNOSIS — I4891 Unspecified atrial fibrillation: Secondary | ICD-10-CM | POA: Diagnosis not present

## 2015-05-24 LAB — COMPREHENSIVE METABOLIC PANEL
ALBUMIN: 2.6 g/dL — AB (ref 3.5–5.0)
ALK PHOS: 91 U/L (ref 38–126)
ALT: 24 U/L (ref 17–63)
ALT: 20 U/L (ref 17–63)
AST: 36 U/L (ref 15–41)
AST: 23 U/L (ref 15–41)
Albumin: 3.1 g/dL — ABNORMAL LOW (ref 3.5–5.0)
Alkaline Phosphatase: 106 U/L (ref 38–126)
Anion gap: 9 (ref 5–15)
Anion gap: 7 (ref 5–15)
BILIRUBIN TOTAL: 0.7 mg/dL (ref 0.3–1.2)
BUN: 16 mg/dL (ref 6–20)
BUN: 14 mg/dL (ref 6–20)
CALCIUM: 8.8 mg/dL — AB (ref 8.9–10.3)
CHLORIDE: 100 mmol/L — AB (ref 101–111)
CO2: 28 mmol/L (ref 22–32)
CO2: 29 mmol/L (ref 22–32)
CREATININE: 1.34 mg/dL — AB (ref 0.61–1.24)
CREATININE: 1.03 mg/dL (ref 0.61–1.24)
Calcium: 9.3 mg/dL (ref 8.9–10.3)
Chloride: 101 mmol/L (ref 101–111)
GFR calc Af Amer: >60 mL/min (ref >60)
GFR calc non Af Amer: 45 mL/min — ABNORMAL LOW (ref >60)
GFR calc non Af Amer: >60 mL/min (ref >60)
GFR, EST AFRICAN AMERICAN: 52 mL/min — AB (ref >60)
GLUCOSE: 123 mg/dL — AB (ref 65–99)
Glucose, Bld: 132 mg/dL — ABNORMAL HIGH (ref 65–99)
POTASSIUM: 5.1 mmol/L (ref 3.5–5.1)
Potassium: 4.7 mmol/L (ref 3.5–5.1)
SODIUM: 137 mmol/L (ref 135–145)
Sodium: 137 mmol/L (ref 135–145)
TOTAL PROTEIN: 5.9 g/dL — AB (ref 6.5–8.1)
Total Bilirubin: 0.6 mg/dL (ref 0.3–1.2)
Total Protein: 7.2 g/dL (ref 6.5–8.1)

## 2015-05-24 LAB — APTT: aPTT: 31 seconds (ref 24–37)

## 2015-05-24 LAB — ABO/RH: ABO/RH(D): O POS

## 2015-05-24 LAB — TYPE AND SCREEN
ABO/RH(D): O POS
ANTIBODY SCREEN: NEGATIVE

## 2015-05-24 LAB — PROTIME-INR
INR: 1.2 (ref 0.00–1.49)
PROTHROMBIN TIME: 15.3 s — AB (ref 11.6–15.2)

## 2015-05-24 LAB — CBC WITH DIFFERENTIAL/PLATELET
BASOS ABS: 0 10*3/uL (ref 0.0–0.1)
BASOS PCT: 0 % (ref 0–1)
Eosinophils Absolute: 0 10*3/uL (ref 0.0–0.7)
Eosinophils Relative: 0 % (ref 0–5)
HEMATOCRIT: 32 % — AB (ref 39.0–52.0)
HEMOGLOBIN: 10.5 g/dL — AB (ref 13.0–17.0)
LYMPHS PCT: 8 % — AB (ref 12–46)
Lymphs Abs: 1.3 10*3/uL (ref 0.7–4.0)
MCH: 26.9 pg (ref 26.0–34.0)
MCHC: 32.8 g/dL (ref 30.0–36.0)
MCV: 82.1 fL (ref 78.0–100.0)
Monocytes Absolute: 1.2 10*3/uL — ABNORMAL HIGH (ref 0.1–1.0)
Monocytes Relative: 7 % (ref 3–12)
NEUTROS ABS: 14.5 10*3/uL — AB (ref 1.7–7.7)
NEUTROS PCT: 85 % — AB (ref 43–77)
Platelets: 434 10*3/uL — ABNORMAL HIGH (ref 150–400)
RBC: 3.9 MIL/uL — ABNORMAL LOW (ref 4.22–5.81)
RDW: 13.2 % (ref 11.5–15.5)
WBC: 17.2 10*3/uL — ABNORMAL HIGH (ref 4.0–10.5)

## 2015-05-24 LAB — LACTIC ACID, PLASMA
Lactic Acid, Venous: 1.5 mmol/L (ref 0.5–2.0)
Lactic Acid, Venous: 1.4 mmol/L (ref 0.5–2.0)

## 2015-05-24 LAB — URINALYSIS, ROUTINE W REFLEX MICROSCOPIC
Bilirubin Urine: NEGATIVE
GLUCOSE, UA: NEGATIVE mg/dL
Hgb urine dipstick: NEGATIVE
Ketones, ur: NEGATIVE mg/dL
LEUKOCYTES UA: NEGATIVE
NITRITE: NEGATIVE
PH: 8.5 — AB (ref 5.0–8.0)
Protein, ur: NEGATIVE mg/dL
SPECIFIC GRAVITY, URINE: 1.013 (ref 1.005–1.030)
Urobilinogen, UA: 1 mg/dL (ref 0.0–1.0)

## 2015-05-24 LAB — CBC
HEMATOCRIT: 34.9 % — AB (ref 39.0–52.0)
HEMOGLOBIN: 11.6 g/dL — AB (ref 13.0–17.0)
MCH: 27.8 pg (ref 26.0–34.0)
MCHC: 33.2 g/dL (ref 30.0–36.0)
MCV: 83.5 fL (ref 78.0–100.0)
PLATELETS: 449 10*3/uL — AB (ref 150–400)
RBC: 4.18 MIL/uL — AB (ref 4.22–5.81)
RDW: 13.3 % (ref 11.5–15.5)
WBC: 18.5 10*3/uL — ABNORMAL HIGH (ref 4.0–10.5)

## 2015-05-24 LAB — I-STAT TROPONIN, ED: Troponin i, poc: 0.02 ng/mL (ref 0.00–0.08)

## 2015-05-24 LAB — POC OCCULT BLOOD, ED: FECAL OCCULT BLD: POSITIVE — AB

## 2015-05-24 LAB — MRSA PCR SCREENING: MRSA BY PCR: NEGATIVE

## 2015-05-24 LAB — PROCALCITONIN: Procalcitonin: <0.1 ng/mL

## 2015-05-24 MED ORDER — MORPHINE SULFATE (PF) 2 MG/ML IV SOLN
2.0000 mg | Freq: Once | INTRAVENOUS | Status: AC
  Administered 2015-05-24: 2 mg via INTRAVENOUS
  Filled 2015-05-24: qty 1

## 2015-05-24 MED ORDER — FENTANYL CITRATE (PF) 100 MCG/2ML IJ SOLN
100.0000 ug | Freq: Once | INTRAMUSCULAR | Status: AC
  Administered 2015-05-24: 100 ug via INTRAVENOUS
  Filled 2015-05-24: qty 2

## 2015-05-24 MED ORDER — SODIUM CHLORIDE 0.9 % IV SOLN
INTRAVENOUS | Status: DC
  Administered 2015-05-24 – 2015-05-26 (×3): via INTRAVENOUS
  Administered 2015-05-27: 75 mL/h via INTRAVENOUS

## 2015-05-24 MED ORDER — SODIUM CHLORIDE 0.9 % IJ SOLN
3.0000 mL | Freq: Two times a day (BID) | INTRAMUSCULAR | Status: DC
  Administered 2015-05-27: 3 mL via INTRAVENOUS

## 2015-05-24 MED ORDER — ACETAMINOPHEN 650 MG RE SUPP: 650.0000 mg | Freq: Four times a day (QID) | RECTAL | Status: DC | PRN

## 2015-05-24 MED ORDER — CHLORHEXIDINE GLUCONATE 0.12 % MT SOLN
15.0000 mL | Freq: Two times a day (BID) | OROMUCOSAL | Status: DC
  Administered 2015-05-25 – 2015-05-28 (×7): 15 mL via OROMUCOSAL
  Filled 2015-05-24 (×8): qty 15

## 2015-05-24 MED ORDER — VANCOMYCIN HCL IN DEXTROSE 1-5 GM/200ML-% IV SOLN
1000.0000 mg | Freq: Once | INTRAVENOUS | Status: AC
  Administered 2015-05-24: 1000 mg via INTRAVENOUS
  Filled 2015-05-24: qty 200

## 2015-05-24 MED ORDER — IOHEXOL 300 MG/ML  SOLN
75.0000 mL | Freq: Once | INTRAMUSCULAR | Status: AC | PRN
  Administered 2015-05-24: 75 mL via INTRAVENOUS

## 2015-05-24 MED ORDER — SODIUM CHLORIDE 0.9 % IV SOLN
500.0000 mg | INTRAVENOUS | Status: DC
  Filled 2015-05-24: qty 500

## 2015-05-24 MED ORDER — MORPHINE SULFATE (PF) 2 MG/ML IV SOLN
2.0000 mg | INTRAVENOUS | Status: DC | PRN
  Administered 2015-05-24 – 2015-05-26 (×8): 2 mg via INTRAVENOUS
  Filled 2015-05-24 (×8): qty 1

## 2015-05-24 MED ORDER — PANTOPRAZOLE SODIUM 40 MG IV SOLR
80.0000 mg | Freq: Once | INTRAVENOUS | Status: AC
  Administered 2015-05-24: 80 mg via INTRAVENOUS
  Filled 2015-05-24: qty 80

## 2015-05-24 MED ORDER — PANTOPRAZOLE SODIUM 40 MG IV SOLR
40.0000 mg | Freq: Two times a day (BID) | INTRAVENOUS | Status: DC
  Administered 2015-05-28: 40 mg via INTRAVENOUS
  Filled 2015-05-24 (×2): qty 40

## 2015-05-24 MED ORDER — SODIUM CHLORIDE 0.9 % IV BOLUS (SEPSIS)
1000.0000 mL | Freq: Once | INTRAVENOUS | Status: AC
  Administered 2015-05-24: 1000 mL via INTRAVENOUS

## 2015-05-24 MED ORDER — DEXTROSE 5 % IV SOLN
2.0000 g | INTRAVENOUS | Status: DC
  Administered 2015-05-24: 2 g via INTRAVENOUS
  Filled 2015-05-24 (×2): qty 2

## 2015-05-24 MED ORDER — METOPROLOL TARTRATE 1 MG/ML IV SOLN: 5.0000 mg | INTRAVENOUS | Status: DC | PRN

## 2015-05-24 MED ORDER — CETYLPYRIDINIUM CHLORIDE 0.05 % MT LIQD
7.0000 mL | Freq: Two times a day (BID) | OROMUCOSAL | Status: DC
  Administered 2015-05-25 – 2015-05-28 (×7): 7 mL via OROMUCOSAL

## 2015-05-24 MED ORDER — SODIUM CHLORIDE 0.9 % IV SOLN
8.0000 mg/h | INTRAVENOUS | Status: AC
  Administered 2015-05-24 – 2015-05-27 (×5): 8 mg/h via INTRAVENOUS
  Filled 2015-05-24 (×12): qty 80

## 2015-05-24 MED ORDER — ONDANSETRON HCL 4 MG PO TABS: 4.0000 mg | ORAL_TABLET | Freq: Four times a day (QID) | ORAL | Status: DC | PRN

## 2015-05-24 MED ORDER — ACETAMINOPHEN 325 MG PO TABS: 650.0000 mg | ORAL_TABLET | Freq: Four times a day (QID) | ORAL | Status: DC | PRN

## 2015-05-24 MED ORDER — ONDANSETRON HCL 4 MG/2ML IJ SOLN
4.0000 mg | Freq: Four times a day (QID) | INTRAMUSCULAR | Status: DC | PRN
  Administered 2015-05-26: 4 mg via INTRAVENOUS
  Filled 2015-05-24: qty 2

## 2015-05-24 MED ORDER — METOPROLOL TARTRATE 1 MG/ML IV SOLN
5.0000 mg | Freq: Once | INTRAVENOUS | Status: AC
  Administered 2015-05-24: 5 mg via INTRAVENOUS
  Filled 2015-05-24: qty 5

## 2015-05-24 NOTE — ED Notes (Signed)
Pt had hernia surgery 2 days ago and was discharge, he states he continues to have abd pain since the surgery. He states he couldn't sleep last night because of hte pain. He reports n/v/constipation.  He was able to have a watery bowel movement this morning.

## 2015-05-24 NOTE — H&P (Signed)
[facility] History and Physical  Travus Oren ZOX:[id number] DOB: Aug 04, 1922 DOA: 05/24/2015  Referring physician:  PCP: Katy Apo, MD   Chief Complaint: Abdominal pain/nausea vomiting  HPI: Luis Norton is a 81 y.o. male with a past medical history of cognitive impairment, history of atrial fibrillation, hypertension, dyslipidemia, peptic ulcer disease who was recently admitted to the medicine service on 05/16/2015 and discharged on 05/22/2015 hospitalized at the time for incarcerated bilateral inguinal hernias. He underwent bilateral inguinal hernia repair on 05/20/2015. He was discharged in stable condition with outpatient follow-up with Dr. Magnus Ivan of general surgery. He presents to the emergency department with complaints of abdominal pain associated with multiple episodes of nausea and vomiting. He describes his abdominal pain as sharp and stabbing over generalize abdomen. He also complains of having dark tarry stools over the past week. In the emergency department found to be Hemoccult-positive. A CT scan of abdomen and pelvis done in the ER revealed findings suggestive of postoperative ileus. Blood work showing elevated white count of 18,500, increased from 9800 on 05/21/2015. Patient is a poor historian, family members were not present. History obtained from patient and emergency room staff.                                               Review of Systems:  Constitutional:  No weight loss, night sweats, Fevers, positive for chills, fatigue.  HEENT:  No headaches, Difficulty swallowing,Tooth/dental problems,Sore throat,  No sneezing, itching, ear ache, nasal congestion, post nasal drip,  Cardio-vascular:  No chest pain, Orthopnea, PND, swelling in lower extremities, anasarca, dizziness, palpitations  GI:  No heartburn, indigestion, positive for abdominal pain, nausea, vomiting, diarrhea, change in bowel habits, loss of appetite  Resp:  No shortness of breath with  exertion or at rest. No excess mucus, no productive cough, No non-productive cough, No coughing up of blood.No change in color of mucus.No wheezing.No chest wall deformity  Skin:  no rash or lesions.  GU:  no dysuria, change in color of urine, no urgency or frequency. No flank pain.  Musculoskeletal:  No joint pain or swelling. No decreased range of motion. No back pain.  Psych:  No change in mood or affect. No depression or anxiety. No memory loss.   Past Medical History  Diagnosis Date  . Hypertension   . Hypercholesterolemia   . ED (erectile dysfunction)   . Urinary frequency     bladder instablity and outlet obstruction   . Constipation   . PUD (peptic ulcer disease)     history of   . BPH (benign prostatic hyperplasia)   . Atrial fibrillation    Past Surgical History  Procedure Laterality Date  . Ankle surgery Right   . Inguinal hernia repair Bilateral 05/20/2015    Procedure: HERNIA REPAIR INGUINAL ADULT BILATERAL;  Surgeon: Abigail Miyamoto, MD;  Location: [facility] OR;  Service: General;  Laterality: Bilateral;  . Insertion of mesh Bilateral 05/20/2015    Procedure: INSERTION OF MESH;  Surgeon: Abigail Miyamoto, MD;  Location: [facility] OR;  Service: General;  Laterality: Bilateral;   Social History:  reports that he has quit smoking. He does not have any smokeless tobacco history on file. He reports that he does not drink alcohol or use illicit drugs.  Allergies  Allergen Reactions  . Penicillins Swelling    History reviewed. No pertinent family history. Patient  is a poor historian given cognitive impairment.  Prior to Admission medications   Medication Sig Start Date End Date Taking? Authorizing Provider  aspirin EC 81 MG EC tablet Take 1 tablet (81 mg total) by mouth daily. 05/22/15  Yes Clydia Llano, MD  bisacodyl (DULCOLAX) 5 MG EC tablet 1 tab q hs Prn constipation Patient not taking: Reported on 09/29/2014 09/23/13   Hayden Rasmussen, NP  metoprolol succinate (TOPROL-XL) 25 MG 24 hr  tablet Take 1 tablet (25 mg total) by mouth daily. 05/22/15   Clydia Llano, MD  traMADol (ULTRAM) 50 MG tablet Take 0.5-1 tablets (25-50 mg total) by mouth every 6 (six) hours as needed for severe pain. 05/22/15   Clydia Llano, MD   Physical Exam: Filed Vitals:   05/24/15 1543 05/24/15 1600 05/24/15 1630 05/24/15 1700  BP: 137/82 141/85 133/88 139/91  Pulse: 103 75  64  Temp:      TempSrc:      Resp: Height:      SpO2: 100% 94% 97% 100%    Wt Readings from Last 3 Encounters:  05/22/15 56.291 kg (124 lb 1.6 oz)  08/15/14 79.833 kg (176 lb)  07/26/13 79.833 kg (176 lb)    General:  Elderly gentleman. He is awake and alert, mildly confused, reporting abdominal pain. Eyes: PERRL, normal lids, irises & conjunctiva ENT: grossly normal hearing, lips & tongue. Dry oral mucosa Neck: no LAD, masses or thyromegaly Cardiovascular: RRR, no m/r/g. No LE edema. Telemetry: SR, no arrhythmias  Respiratory: CTA bilaterally, no w/r/r. Normal respiratory effort. Abdomen: His abdomen is distended, bowel sounds are present, there is pain over generalized abdomen Skin: no rash or induration seen on limited exam Musculoskeletal: grossly normal tone BUE/BLE Psychiatric: grossly normal mood and affect, speech fluent and appropriate Neurologic: grossly non-focal.          Labs on Admission:  Basic Metabolic Panel:  Recent Labs Lab 05/21/15 0537 05/24/15 1135  NA 132* 137  K 4.4 5.1  CL 96* 100*  CO2 30 28  GLUCOSE 109* 132*  BUN 13 16  CREATININE 1.05 1.34*  CALCIUM 8.4* 9.3   Liver Function Tests:  Recent Labs Lab 05/24/15 1135  AST 36  ALT 24  ALKPHOS 106  BILITOT 0.6  PROT 7.2  ALBUMIN 3.1*   No results for input(s): LIPASE, AMYLASE in the last 168 hours. No results for input(s): AMMONIA in the last 168 hours. CBC:  Recent Labs Lab 05/21/15 0537 05/24/15 1135  WBC 9.8 18.5*  HGB 10.5* 11.6*  HCT 31.7* 34.9*  MCV 83.9 83.5  PLT 355 449*   Cardiac  Enzymes: No results for input(s): CKTOTAL, CKMB, CKMBINDEX, TROPONINI in the last 168 hours.  BNP (last 3 results) No results for input(s): BNP in the last 8760 hours.  ProBNP (last 3 results) No results for input(s): PROBNP in the last 8760 hours.  CBG: No results for input(s): GLUCAP in the last 168 hours.  Radiological Exams on Admission: Ct Abdomen Pelvis W Contrast  05/24/2015   CLINICAL DATA:  Abdominal pain, nausea and vomiting. No bowel movement since a bilateral inguinal hernia repair 3 days ago.  EXAM: CT ABDOMEN AND PELVIS WITH CONTRAST  TECHNIQUE: Multidetector CT imaging of the abdomen and pelvis was performed using the standard protocol following bolus administration of intravenous contrast.  CONTRAST:  75mL OMNIPAQUE IOHEXOL 300 MG/ML  SOLN  COMPARISON:  05/16/2015.  FINDINGS: Dilated stomach filled with oral contrast and air with little  change in degree of dilatation. Interval dilated, gas-filled loops of bowel in the anterior abdomen. These appear to represent a combination of dilated small bowel and sigmoid colon. The remainder of the colon and small bowel are not dilated. No free peritoneal fluid or air.  Interval repair of the previously seen bilateral inguinal hernias with associated postoperative air swelling in both inguinal canals. There is also postoperative air in the anterior abdominal wall at the level of the pelvis bilaterally.  Markedly enlarged, heterogeneous prostate gland is again demonstrated. Anterior bladder wall thickening is also again demonstrated without a focal mass. No hydronephrosis or hydroureter. A large left renal cyst is again demonstrated.  Unremarkable liver, spleen, pancreas, gallbladder and adrenal glands. No enlarged lymph nodes. Atheromatous arterial calcifications. Clear lung bases. Extensive lumbar and lower thoracic spine degenerative changes.  IMPRESSION: 1. Probable postoperative ileus involving central bowel loops. 2. Chronic gastric distention.  3. Status post surgically repaired bilateral inguinal hernias with expected postoperative changes. 4. Stable markedly enlarged and heterogeneous prostate gland. 5. Diffuse anterior bladder wall thickening, most likely due to chronic bladder outlet obstruction by the enlarged prostate gland.   Electronically Signed   By: Beckie Salts M.D.   On: 05/24/2015 15:36    EKG: Independently reviewed. Atrial fibrillation with RVR  Assessment/Plan Active Problems:   Sepsis   Ileus, postoperative   Atrial fibrillation with RVR   PUD (peptic ulcer disease)   Upper GI bleed   1. Sepsis. Present on admission, evidenced by a white count of 18,500 with heart rates in the 140s, development of acute renal failure, with unclear source of infection. CT scan of abdomen and pelvis revealed the presence of postoperative ileus. Urinalysis was negative. Will check a portable chest x-ray to assess for possible HCAP. Will initiate broad-spectrum IV antimicrobial therapy with Vancomycin and Fortaz. Obtain blood cultures from 2 peripheral sites.  2. Postoperative ileus. Patient presenting with multiple episodes of nausea/vomiting associate as well as abdominal pain and distention. He was seen and evaluated by general surgery in the emergency department. A CT scan of abdomen and pelvis showed possible postoperative ileus. There did not appear to be other complicating factors.  Will keep him nothing by mouth and provide IV fluids and as needed narcotic analgesics for severe pain. Supportive care. 3. Suspected upper GI bleed. Patient reporting black tarry stools at home, found to be guaiac positive in the emergency department. He has a history of peptic ulcer disease. Could also be related to Mallory-Weiss tear from multiple episodes of nausea and vomiting. Emergency room provider discussed case with GI who will consult. Will place Mr Duley on Protonix drip. Follow H & H. Await further recommendations from GI. 4. Atrial  fibrillation with rapid ventricular response. Patient with history of atrial fibrillation having CHADVasc score of 4. Not a anticoagulation candidate given probable upper GI bleed. Heart rates improvement after the administration of Lopressor in the emergency department. Will provide Lopressor 5 mg IV every one hour as needed for ventricular rates greater than 110.  5. History of bilateral inguinal hernia repair. Status post repair of bilateral indirect inguinal hernias with mesh, procedure performed on 05/20/2015 by Dr. Magnus Ivan. 6. Hypertension. Will provide IV Lopressor. 7. DVT prophylaxis. SCDs.   Code Status: Full code Family Communication: Family not present Disposition Plan: Limit patient to the step down unit anticipate may require greater than 2 nights hospitalization  Time spent: 70 minutes  Jeralyn Bennett [facility] Pager [phone]

## 2015-05-24 NOTE — ED Notes (Signed)
Pts HR 143, afib, new EKG printed and shown to Dr. Vanessa Barbara. Dr. Vanessa Barbara at bedside.

## 2015-05-24 NOTE — Progress Notes (Signed)
ANTIBIOTIC CONSULT NOTE - INITIAL  Pharmacy Consult for vancomycin and ceftazidime Indication: rule out sepsis  Allergies  Allergen Reactions  . Penicillins Swelling    Patient Measurements: Height:  (167.6 cm) IBW/kg (Calculated) : 63.8  Vital Signs: Temp: 99.2 F (37.3 C) (09/04 1104) Temp Source: Oral (09/04 1104) BP: 139/91 mmHg (09/04 1700) Pulse Rate: 64 (09/04 1700) Intake/Output from previous day:   Intake/Output from this shift: Total I/O In: 1000 [IV Piggyback:1000] Out: -   Labs:  Recent Labs  05/24/15 1135  WBC 18.5*  HGB 11.6*  PLT 449*  CREATININE 1.34*   Estimated Creatinine Clearance: 29.2 mL/min (by C-G formula based on Cr of 1.34). No results for input(s): VANCOTROUGH, VANCOPEAK, VANCORANDOM, GENTTROUGH, GENTPEAK, GENTRANDOM, TOBRATROUGH, TOBRAPEAK, TOBRARND, AMIKACINPEAK, AMIKACINTROU, AMIKACIN in the last 72 hours.   Microbiology: No results found for this or any previous visit (from the past 720 hour(s)).  Medical History: Past Medical History  Diagnosis Date  . Hypertension   . Hypercholesterolemia   . ED (erectile dysfunction)   . Urinary frequency     bladder instablity and outlet obstruction   . Constipation   . PUD (peptic ulcer disease)     history of   . BPH (benign prostatic hyperplasia)   . Atrial fibrillation     Assessment: 63 YOM who presents to the ED with abdominal pain. He has an elevated WBC of 18.5 and is afebrile. CT shows Procalcitonin and lactic acid have been ordered as have blood cultures. SCr today 1.34, CrCL ~25-32mL/min.  Note patient has an allergy of swelling listed to penicillins. Do not note any administration of cephalosporins in our system. Please watch carefully for any sign of allergy.  Goal of Therapy:  Vancomycin trough level 15-20 mcg/ml  Plan:  -vancomycin 1g IV x1, then  IV q24h as maintenance dose -ceftazidime 2g IV q24h -follow up c/s, clinical progression, renal function and  levels PRN  Yisrael Obryan D. Makana Feigel, PharmD, BCPS Clinical Pharmacist Pager: [phone] 05/24/2015 5:44 PM

## 2015-05-24 NOTE — Consult Note (Signed)
Reason for Consult:po hernia /  Abdominal pain/  Nausea/  Melena /  A fib  Referring Physician: Jerusalem Wert is an 81 y.o. male.  HPI: asked to see pt after BIH with mesh 4 days ago after admission to medicine for abdominal pain/ nausea and vomiting.  He states he has had this pain since surgery and before.  Had 2 BM today and is passing urine.  CT done which shows post op changes without complication  And contrast in the cecum with large stool burden. No free air or signs of intraabdominal  Problem that is acute.  Pt states that her hurts around his incisions.  Voiding ok and bowel moving.  BM in ED dark black and heme  Positive.    Past Medical History  Diagnosis Date  . Hypertension   . Hypercholesterolemia   . ED (erectile dysfunction)   . Urinary frequency     bladder instablity and outlet obstruction   . Constipation   . PUD (peptic ulcer disease)     history of   . BPH (benign prostatic hyperplasia)   . Atrial fibrillation     Past Surgical History  Procedure Laterality Date  . Ankle surgery Right   . Inguinal hernia repair Bilateral 05/20/2015    Procedure: HERNIA REPAIR INGUINAL ADULT BILATERAL;  Surgeon: Abigail Miyamoto, MD;  Location: [facility] OR;  Service: General;  Laterality: Bilateral;  . Insertion of mesh Bilateral 05/20/2015    Procedure: INSERTION OF MESH;  Surgeon: Abigail Miyamoto, MD;  Location: [facility] OR;  Service: General;  Laterality: Bilateral;    History reviewed. No pertinent family history.  Social History:  reports that he has quit smoking. He does not have any smokeless tobacco history on file. He reports that he does not drink alcohol or use illicit drugs.  Allergies:  Allergies  Allergen Reactions  . Penicillins Swelling    Medications: I have reviewed the patient's current medications.  Results for orders placed or performed during the hospital encounter of 05/24/15 (from the past 48 hour(s))  Comprehensive metabolic panel     Status:  Abnormal   Collection Time: 05/24/15 11:35 AM  Result Value Ref Range   Sodium 137 135 - 145 mmol/L   Potassium 5.1 3.5 - 5.1 mmol/L   Chloride 100 (L) 101 - 111 mmol/L   CO2 28 22 - 32 mmol/L   Glucose, Bld 132 (H) 65 - 99 mg/dL   BUN 16 6 - 20 mg/dL   Creatinine, Ser 2.85 (H) 0.61 - 1.24 mg/dL   Calcium 9.3 8.9 - 27.9 mg/dL   Total Protein 7.2 6.5 - 8.1 g/dL   Albumin 3.1 (L) 3.5 - 5.0 g/dL   AST 36 15 - 41 U/L   ALT 24 17 - 63 U/L   Alkaline Phosphatase 106 38 - 126 U/L   Total Bilirubin 0.6 0.3 - 1.2 mg/dL   GFR calc non Af Amer 45 (L) >60 mL/min   GFR calc Af Amer 52 (L) >60 mL/min    Comment: (NOTE) The eGFR has been calculated using the CKD EPI equation. This calculation has not been validated in all clinical situations. eGFR's persistently <60 mL/min signify possible Chronic Kidney Disease.    Anion gap 9 5 - 15  CBC     Status: Abnormal   Collection Time: 05/24/15 11:35 AM  Result Value Ref Range   WBC 18.5 (H) 4.0 - 10.5 K/uL   RBC 4.18 (L) 4.22 - 5.81 MIL/uL  Hemoglobin 11.6 (L) 13.0 - 17.0 g/dL   HCT 34.9 (L) 39.0 - 52.0 %   MCV 83.5 78.0 - 100.0 fL   MCH 27.8 26.0 - 34.0 pg   MCHC 33.2 30.0 - 36.0 g/dL   RDW 13.3 11.5 - 15.5 %   Platelets 449 (H) 150 - 400 K/uL  I-Stat Troponin, ED (not at [facility])     Status: None   Collection Time: 05/24/15 11:45 AM  Result Value Ref Range   Troponin i, poc 0.02 0.00 - 0.08 ng/mL   Comment 3            Comment: Due to the release kinetics of cTnI, a negative result within the first hours of the onset of symptoms does not rule out myocardial infarction with certainty. If myocardial infarction is still suspected, repeat the test at appropriate intervals.   POC occult blood, ED Provider will collect     Status: Abnormal   Collection Time: 05/24/15 12:07 PM  Result Value Ref Range   Fecal Occult Bld POSITIVE (A) NEGATIVE  Urinalysis, Routine w reflex microscopic (not at [facility])     Status: Abnormal   Collection Time:  05/24/15  2:55 PM  Result Value Ref Range   Color, Urine YELLOW YELLOW   APPearance HAZY (A) CLEAR   Specific Gravity, Urine 1.013 1.005 - 1.030   pH 8.5 (H) 5.0 - 8.0   Glucose, UA NEGATIVE NEGATIVE mg/dL   Hgb urine dipstick NEGATIVE NEGATIVE   Bilirubin Urine NEGATIVE NEGATIVE   Ketones, ur NEGATIVE NEGATIVE mg/dL   Protein, ur NEGATIVE NEGATIVE mg/dL   Urobilinogen, UA 1.0 0.0 - 1.0 mg/dL   Nitrite NEGATIVE NEGATIVE   Leukocytes, UA NEGATIVE NEGATIVE    Comment: MICROSCOPIC NOT DONE ON URINES WITH NEGATIVE PROTEIN, BLOOD, LEUKOCYTES, NITRITE, OR GLUCOSE <1000 mg/dL.    Ct Abdomen Pelvis W Contrast  05/24/2015   CLINICAL DATA:  Abdominal pain, nausea and vomiting. No bowel movement since a bilateral inguinal hernia repair 3 days ago.  EXAM: CT ABDOMEN AND PELVIS WITH CONTRAST  TECHNIQUE: Multidetector CT imaging of the abdomen and pelvis was performed using the standard protocol following bolus administration of intravenous contrast.  CONTRAST:  65mL OMNIPAQUE IOHEXOL 300 MG/ML  SOLN  COMPARISON:  05/16/2015.  FINDINGS: Dilated stomach filled with oral contrast and air with little change in degree of dilatation. Interval dilated, gas-filled loops of bowel in the anterior abdomen. These appear to represent a combination of dilated small bowel and sigmoid colon. The remainder of the colon and small bowel are not dilated. No free peritoneal fluid or air.  Interval repair of the previously seen bilateral inguinal hernias with associated postoperative air swelling in both inguinal canals. There is also postoperative air in the anterior abdominal wall at the level of the pelvis bilaterally.  Markedly enlarged, heterogeneous prostate gland is again demonstrated. Anterior bladder wall thickening is also again demonstrated without a focal mass. No hydronephrosis or hydroureter. A large left renal cyst is again demonstrated.  Unremarkable liver, spleen, pancreas, gallbladder and adrenal glands. No enlarged  lymph nodes. Atheromatous arterial calcifications. Clear lung bases. Extensive lumbar and lower thoracic spine degenerative changes.  IMPRESSION: 1. Probable postoperative ileus involving central bowel loops. 2. Chronic gastric distention. 3. Status post surgically repaired bilateral inguinal hernias with expected postoperative changes. 4. Stable markedly enlarged and heterogeneous prostate gland. 5. Diffuse anterior bladder wall thickening, most likely due to chronic bladder outlet obstruction by the enlarged prostate gland.   Electronically Signed  By: Claudie Revering M.D.   On: 05/24/2015 15:36    Review of Systems  Constitutional: Positive for malaise/fatigue and diaphoresis.  Gastrointestinal: Positive for nausea, vomiting and abdominal pain.  Skin: Negative.   Psychiatric/Behavioral: Negative.    Blood pressure 137/82, pulse 103, temperature 99.2 F (37.3 C), temperature source Oral, resp. rate 12, height $RemoveBe'5\' 6"'[id number]$  (1.676 m), SpO2 100 %. Physical Exam  Constitutional: He has a sickly appearance.  Eyes: Pupils are equal, round, and reactive to light.  GI: He exhibits distension. There is tenderness in the right lower quadrant and left lower quadrant. There is no rigidity, no rebound and no guarding. Hernia confirmed negative in the right inguinal area and confirmed negative in the left inguinal area.    Genitourinary: Penis normal.  Skin: Skin is warm and dry.    Assessment/Plan: POD 4 BIH repair with mesh wound are clean without signs of infection Abdominal pain Nausea/vomiting Melena  Reviewed CT and contrast in the colon already which exclude ileus;    no other complicating features  Had pain and similar symptoms prior to surgery Given heme pos stool/melena will need GI to see for consideration of endoscopy A fib      Medicine to see Not sure this is totally  related to hernia repair since this was how he presented prior to the OR. Will follow while in house if he is admitted     [place] A. 05/24/2015, 4:18 PM

## 2015-05-24 NOTE — Progress Notes (Signed)
Attempted restart of IV's on patient x 2 no success. IV Team paged and explained that patient needed 2 IV's. IV team stuck twice only got 1 iv pt refused any further sticks will continue to monitor

## 2015-05-24 NOTE — Progress Notes (Signed)
Patient pulled out both right wrist and right forearm IVs. When asked why patient removed IVs he stated that he "was removing them so that he could get up and leave." RN explained to patient the importance of IV access for medication administration and that he needed to call out before getting up. Will continue to monitor.

## 2015-05-24 NOTE — ED Notes (Signed)
Pts HR 92 after administration of Lopressor. New EKG printed and shown to Dr. Vanessa Barbara

## 2015-05-24 NOTE — ED Provider Notes (Addendum)
CSN: [id number]     Arrival date & time 05/24/15  1031 History   First MD Initiated Contact with Patient 05/24/15 1141     Chief Complaint  Patient presents with  . Abdominal Pain     (Consider location/radiation/quality/duration/timing/severity/associated sxs/prior Treatment) HPI Planes of diffuse abdominal pain onset 05/21/2015 since having had bilateral inguinal hernia repair here. Patient vomited twice today. He denies fever. He reports having had 3 dark bowel movements today. He denies chest pain denies shortness of breath. No other associated symptoms. No treatment prior to coming here. No urinary symptoms. Nothing makes symptoms better or worse. Pain is diffuse and constant Past Medical History  Diagnosis Date  . Hypertension   . Hypercholesterolemia   . ED (erectile dysfunction)   . Urinary frequency     bladder instablity and outlet obstruction   . Constipation   . PUD (peptic ulcer disease)     history of   . BPH (benign prostatic hyperplasia)   . Atrial fibrillation    Past Surgical History  Procedure Laterality Date  . Ankle surgery Right   . Inguinal hernia repair Bilateral 05/20/2015    Procedure: HERNIA REPAIR INGUINAL ADULT BILATERAL;  Surgeon: Abigail Miyamoto, MD;  Location: [facility] OR;  Service: General;  Laterality: Bilateral;  . Insertion of mesh Bilateral 05/20/2015    Procedure: INSERTION OF MESH;  Surgeon: Abigail Miyamoto, MD;  Location: [facility] OR;  Service: General;  Laterality: Bilateral;   History reviewed. No pertinent family history. Social History  Substance Use Topics  . Smoking status: Former Games developer  . Smokeless tobacco: None  . Alcohol Use: No    Review of Systems  Constitutional: Negative.   HENT: Negative.   Respiratory: Negative.   Cardiovascular: Negative.   Gastrointestinal: Positive for nausea, vomiting and abdominal pain.       Dark frequent bowel movements  Musculoskeletal: Negative.   Skin: Negative.   Neurological: Negative.    Psychiatric/Behavioral: Negative.   All other systems reviewed and are negative.     Allergies  Penicillins  Home Medications   Prior to Admission medications   Medication Sig Start Date End Date Taking? Authorizing Provider  aspirin EC 81 MG EC tablet Take 1 tablet (81 mg total) by mouth daily. 05/22/15   Clydia Llano, MD  bisacodyl (DULCOLAX) 5 MG EC tablet 1 tab q hs Prn constipation Patient not taking: Reported on 09/29/2014 09/23/13   Hayden Rasmussen, NP  metoprolol succinate (TOPROL-XL) 25 MG 24 hr tablet Take 1 tablet (25 mg total) by mouth daily. 05/22/15   Clydia Llano, MD  traMADol (ULTRAM) 50 MG tablet Take 0.5-1 tablets (25-50 mg total) by mouth every 6 (six) hours as needed for severe pain. 05/22/15   Mutaz Elmahi, MD   BP 144/77 mmHg  Pulse 100  Temp(Src) 99.2 F (37.3 C) (Oral)  Resp 15  Ht  (1.676 m)  SpO2 100% Physical Exam  Constitutional: He appears well-developed and well-nourished.  HENT:  Head: Normocephalic and atraumatic.  Eyes: Conjunctivae are normal. Pupils are equal, round, and reactive to light.  Neck: Neck supple. No tracheal deviation present. No thyromegaly present.  Cardiovascular:  No murmur heard. Tachycardic irregularly irregular  Pulmonary/Chest: Effort normal and breath sounds normal.  Abdominal: Soft. Bowel sounds are normal. He exhibits no distension. There is tenderness.  Well-healing surgical wounds bilaterally lower abdomen. Diffusely tender  Genitourinary: Prostate normal and penis normal. Guaiac positive stool.  Black stool Hemoccult-positive  Musculoskeletal: Normal range of motion. He exhibits  no edema or tenderness.  Neurological: He is alert. Coordination normal.  Skin: Skin is warm and dry. No rash noted.  Psychiatric: He has a normal mood and affect.  Nursing note and vitals reviewed.   ED Course  Procedures (including critical care time) Labs Review Labs Reviewed  CBC - Abnormal; Notable for the following:    WBC 18.5  (*)    RBC 4.18 (*)    Hemoglobin 11.6 (*)    HCT 34.9 (*)    Platelets 449 (*)    All other components within normal limits  COMPREHENSIVE METABOLIC PANEL  URINALYSIS, ROUTINE W REFLEX MICROSCOPIC (NOT AT [facility])  Rosezena Sensor, ED  POC OCCULT BLOOD, ED    Imaging Review No results found. I have personally reviewed and evaluated these images and lab results as part of my medical decision-making.   EKG Interpretation None     2:20 PM pain not improved after treatment with intravenous fentanyl. Intravenous morphine ordered a shot alert Glasgow Coma Score 15 4:05 PM continues to complain of diffuse abdominal pain. Additional intravenous morphine ordered. Results for orders placed or performed during the hospital encounter of 05/24/15  Comprehensive metabolic panel  Result Value Ref Range   Sodium 137 135 - 145 mmol/L   Potassium 5.1 3.5 - 5.1 mmol/L   Chloride 100 (L) 101 - 111 mmol/L   CO2 28 22 - 32 mmol/L   Glucose, Bld 132 (H) 65 - 99 mg/dL   BUN 16 6 - 20 mg/dL   Creatinine, Ser 1.61 (H) 0.61 - 1.24 mg/dL   Calcium 9.3 8.9 - 09.6 mg/dL   Total Protein 7.2 6.5 - 8.1 g/dL   Albumin 3.1 (L) 3.5 - 5.0 g/dL   AST 36 15 - 41 U/L   ALT 24 17 - 63 U/L   Alkaline Phosphatase 106 38 - 126 U/L   Total Bilirubin 0.6 0.3 - 1.2 mg/dL   GFR calc non Af Amer 45 (L) >60 mL/min   GFR calc Af Amer 52 (L) >60 mL/min   Anion gap 9 5 - 15  CBC  Result Value Ref Range   WBC 18.5 (H) 4.0 - 10.5 K/uL   RBC 4.18 (L) 4.22 - 5.81 MIL/uL   Hemoglobin 11.6 (L) 13.0 - 17.0 g/dL   HCT 04.5 (L) 40.9 - 81.1 %   MCV 83.5 78.0 - 100.0 fL   MCH 27.8 26.0 - 34.0 pg   MCHC 33.2 30.0 - 36.0 g/dL   RDW 91.4 78.2 - 95.6 %   Platelets 449 (H) 150 - 400 K/uL  Urinalysis, Routine w reflex microscopic (not at [facility])  Result Value Ref Range   Color, Urine YELLOW YELLOW   APPearance HAZY (A) CLEAR   Specific Gravity, Urine 1.013 1.005 - 1.030   pH 8.5 (H) 5.0 - 8.0   Glucose, UA NEGATIVE NEGATIVE mg/dL    Hgb urine dipstick NEGATIVE NEGATIVE   Bilirubin Urine NEGATIVE NEGATIVE   Ketones, ur NEGATIVE NEGATIVE mg/dL   Protein, ur NEGATIVE NEGATIVE mg/dL   Urobilinogen, UA 1.0 0.0 - 1.0 mg/dL   Nitrite NEGATIVE NEGATIVE   Leukocytes, UA NEGATIVE NEGATIVE  I-Stat Troponin, ED (not at [facility])  Result Value Ref Range   Troponin i, poc 0.02 0.00 - 0.08 ng/mL   Comment 3          POC occult blood, ED Provider will collect  Result Value Ref Range   Fecal Occult Bld POSITIVE (A) NEGATIVE   Dg Chest 2  View  05/16/2015   CLINICAL DATA:  Abdominal pain 4 days. Nausea and vomiting with constipation.  EXAM: CHEST  2 VIEW  COMPARISON:  03/18/2013 and 03/10/2013  FINDINGS: Lungs are hyperexpanded with flattening of the hemidiaphragms. There is no focal consolidation or effusion. There is borderline cardiomegaly. Calcified plaque is present over the thoracic aorta. Small nodular cluster over the lateral right midlung unchanged likely calcified granulomas. Remainder of the exam is unchanged.  IMPRESSION: No acute cardiopulmonary disease.  COPD.   Electronically Signed   By: Elberta Fortis M.D.   On: 05/16/2015 16:17   Ct Abdomen Pelvis W Contrast  05/24/2015   CLINICAL DATA:  Abdominal pain, nausea and vomiting. No bowel movement since a bilateral inguinal hernia repair 3 days ago.  EXAM: CT ABDOMEN AND PELVIS WITH CONTRAST  TECHNIQUE: Multidetector CT imaging of the abdomen and pelvis was performed using the standard protocol following bolus administration of intravenous contrast.  CONTRAST:  75mL OMNIPAQUE IOHEXOL 300 MG/ML  SOLN  COMPARISON:  05/16/2015.  FINDINGS: Dilated stomach filled with oral contrast and air with little change in degree of dilatation. Interval dilated, gas-filled loops of bowel in the anterior abdomen. These appear to represent a combination of dilated small bowel and sigmoid colon. The remainder of the colon and small bowel are not dilated. No free peritoneal fluid or air.  Interval repair of  the previously seen bilateral inguinal hernias with associated postoperative air swelling in both inguinal canals. There is also postoperative air in the anterior abdominal wall at the level of the pelvis bilaterally.  Markedly enlarged, heterogeneous prostate gland is again demonstrated. Anterior bladder wall thickening is also again demonstrated without a focal mass. No hydronephrosis or hydroureter. A large left renal cyst is again demonstrated.  Unremarkable liver, spleen, pancreas, gallbladder and adrenal glands. No enlarged lymph nodes. Atheromatous arterial calcifications. Clear lung bases. Extensive lumbar and lower thoracic spine degenerative changes.  IMPRESSION: 1. Probable postoperative ileus involving central bowel loops. 2. Chronic gastric distention. 3. Status post surgically repaired bilateral inguinal hernias with expected postoperative changes. 4. Stable markedly enlarged and heterogeneous prostate gland. 5. Diffuse anterior bladder wall thickening, most likely due to chronic bladder outlet obstruction by the enlarged prostate gland.   Electronically Signed   By: Beckie Salts M.D.   On: 05/24/2015 15:36   Ct Abdomen Pelvis W Contrast  05/16/2015   CLINICAL DATA:  Severe lower abdominal pain for 4 days, constipation, history hypertension, former smoker, peptic ulcer disease  EXAM: CT ABDOMEN AND PELVIS WITH CONTRAST  TECHNIQUE: Multidetector CT imaging of the abdomen and pelvis was performed using the standard protocol following bolus administration of intravenous contrast. Sagittal and coronal MPR images reconstructed from axial data set.  CONTRAST:  OMNIPAQUE IOHEXOL 300 MG/ML SOLN IV. Dilute oral contrast.  COMPARISON:  09/29/2014  FINDINGS: Lung bases emphysematous but clear.  Large cyst arising from anterior aspect of mid LEFT kidney 7.5 x 5.4 x 7.1 cm.  Liver, spleen, pancreas, kidneys, and adrenal glands otherwise normal.  Stomach distended by contrast and food debris.  Appendix,  cecum, and terminal ileum located within a large RIGHT inguinal hernia.  Numerous small bowel loops within a large LEFT inguinal hernia.  Bowel loops otherwise unremarkable without evidence of bowel obstruction.  Extensive atherosclerotic calcifications.  Marked prostatic enlargement, gland 6.5 x 4.9 x 6.3 cm.  No mass, adenopathy, free air or free fluid.  Osseous demineralization with degenerative disc and facet disease changes of the lumbar spine.  IMPRESSION: Large LEFT renal cyst.  Small bowel loops within large LEFT inguinal hernia without obstruction.  Appendix, cecum and terminal ileum within large RIGHT inguinal hernia without obstruction.  Marked prostatic enlargement.  Extensive atherosclerotic disease.   Electronically Signed   By: Ulyses Southward M.D.   On: 05/16/2015 17:19   Dg Abd 2 Views  05/17/2015   CLINICAL DATA:  Inguinal hernia, mid abdominal pain.  EXAM: ABDOMEN - 2 VIEW  COMPARISON:  CT 05/16/2015  FINDINGS: Oral contrast material seen within the colon. Mild diffuse gaseous distention of bowel. No evidence of bowel obstruction. No free air. No organomegaly.  IMPRESSION: Mild diffuse gaseous distention of bowel, likely mild ileus. No evidence of bowel obstruction or free air.   Electronically Signed   By: Charlett Nose M.D.   On: 05/17/2015 12:06    MDM  I spoke with Dr. Luisa Hart who reviewed CT scan and wishes to consult on case. He defers to medical service P patient's multiple medical problems. I spoke with Dr.Zamora who will arrange for admission to stepdown unit. Lopressor 5 mg intravenous ordered after patient was administered intravenous fluids and intravenous analgesics, with hope of lowering heart rate. [facility] consulted in light of melena Dx #1 post operative pain and ileus #2 atrial fibrillation with rapid ventricular response #3 gi gleeding #4 anemia Final diagnoses:  None  CRITICAL CARE Performed by: Doug Sou Total critical care time:  40 minutes Critical  care time was exclusive of separately billable procedures and treating other patients. Critical care was necessary to treat or prevent imminent or life-threatening deterioration. Critical care was time spent personally by me on the following activities: development of treatment plan with patient and/or surrogate as well as nursing, discussions with consultants, evaluation of patient's response to treatment, examination of patient, obtaining history from patient or surrogate, ordering and performing treatments and interventions, ordering and review of laboratory studies, ordering and review of radiographic studies, pulse oximetry and re-evaluation of patient's condition.      Doug Sou, MD 05/24/15 1653  Addendum: I spoke with Dr Ewing Schlein fro GI service, who will see pt in consultation  Doug Sou, MD 05/24/15 [id number]

## 2015-05-25 ENCOUNTER — Encounter (HOSPITAL_COMMUNITY): Payer: Self-pay | Admitting: Gastroenterology

## 2015-05-25 DIAGNOSIS — R1084 Generalized abdominal pain: Secondary | ICD-10-CM

## 2015-05-25 DIAGNOSIS — R531 Weakness: Secondary | ICD-10-CM

## 2015-05-25 DIAGNOSIS — Z7189 Other specified counseling: Secondary | ICD-10-CM

## 2015-05-25 DIAGNOSIS — D72829 Elevated white blood cell count, unspecified: Secondary | ICD-10-CM

## 2015-05-25 DIAGNOSIS — K921 Melena: Secondary | ICD-10-CM

## 2015-05-25 DIAGNOSIS — Z515 Encounter for palliative care: Secondary | ICD-10-CM

## 2015-05-25 LAB — BASIC METABOLIC PANEL
Anion gap: 8 (ref 5–15)
BUN: 12 mg/dL (ref 6–20)
CALCIUM: 8.4 mg/dL — AB (ref 8.9–10.3)
CO2: 27 mmol/L (ref 22–32)
CREATININE: 0.86 mg/dL (ref 0.61–1.24)
Chloride: 102 mmol/L (ref 101–111)
GFR calc non Af Amer: >60 mL/min (ref >60)
Glucose, Bld: 96 mg/dL (ref 65–99)
Potassium: 4.7 mmol/L (ref 3.5–5.1)
SODIUM: 137 mmol/L (ref 135–145)

## 2015-05-25 LAB — CBC
HCT: 26.9 % — ABNORMAL LOW (ref 39.0–52.0)
Hemoglobin: 8.9 g/dL — ABNORMAL LOW (ref 13.0–17.0)
MCH: 27.5 pg (ref 26.0–34.0)
MCHC: 33.1 g/dL (ref 30.0–36.0)
MCV: 83 fL (ref 78.0–100.0)
PLATELETS: 431 10*3/uL — AB (ref 150–400)
RBC: 3.24 MIL/uL — AB (ref 4.22–5.81)
RDW: 13.4 % (ref 11.5–15.5)
WBC: 14.3 10*3/uL — AB (ref 4.0–10.5)

## 2015-05-25 MED ORDER — BISACODYL 10 MG RE SUPP
10.0000 mg | Freq: Once | RECTAL | Status: AC
  Administered 2015-05-25: 10 mg via RECTAL
  Filled 2015-05-25: qty 1

## 2015-05-25 MED ORDER — POLYETHYLENE GLYCOL 3350 17 G PO PACK
17.0000 g | PACK | Freq: Every day | ORAL | Status: DC
  Administered 2015-05-25 – 2015-05-27 (×3): 17 g via ORAL
  Filled 2015-05-25 (×2): qty 1

## 2015-05-25 MED ORDER — DOCUSATE SODIUM 100 MG PO CAPS: 100.0000 mg | ORAL_CAPSULE | Freq: Two times a day (BID) | ORAL | Status: DC

## 2015-05-25 MED ORDER — DEXTROSE 5 % IV SOLN
2.0000 g | Freq: Two times a day (BID) | INTRAVENOUS | Status: DC
  Administered 2015-05-25 – 2015-05-26 (×3): 2 g via INTRAVENOUS
  Filled 2015-05-25 (×5): qty 2

## 2015-05-25 MED ORDER — METOPROLOL SUCCINATE ER 25 MG PO TB24
25.0000 mg | ORAL_TABLET | Freq: Every day | ORAL | Status: DC
Start: 2015-05-25 — End: 2015-05-28
  Administered 2015-05-25 – 2015-05-28 (×3): 25 mg via ORAL
  Filled 2015-05-25 (×3): qty 1

## 2015-05-25 MED ORDER — VANCOMYCIN HCL IN DEXTROSE 750-5 MG/150ML-% IV SOLN
750.0000 mg | INTRAVENOUS | Status: DC
  Administered 2015-05-25 – 2015-05-26 (×2): 750 mg via INTRAVENOUS
  Filled 2015-05-25 (×3): qty 150

## 2015-05-25 NOTE — Progress Notes (Signed)
PROGRESS NOTE  Luis Norton ZOX:[id number] DOB: 06-Dec-1921 DOA: 05/24/2015 PCP: Katy Apo, MD  Brief history 81 year old male with a history of cognitive impairment, atrial fibrillation, hypertension, BPH, peptic ulcer disease hyperlipidemia, presented with 2-3 day history of abdominal pain, nausea, vomiting.  The patient was discharged from the hospital on 05/22/2015 in stable condition after repair of inguinal incarcerated hernias performed by Dr. Magnus Ivan. Since admission, the patient has had a bowel movement, but appears that his vomiting has improved. In addition, the patient has been complaining of melanotic stools since discharge from the hospital. Upon admission, the patient was noted to be tachycardic (up to 140s) with low-grade temperature. WBC was 18.5. CT abdomen and pelvis negative for obstruction or free air. Assessment/Plan: Leukocytosis/tachycardia/acute kidney injury -Continue intravenous fluids -Continue empiric antibiotics pending culture data -Lactic acid 1.5 -Procalcitonin <0.10 -05/24/2015 CT abdomen and pelvis dilated small bowel and sigmoid colon concern with ileus, no free air Acute kidney injury -Secondary to volume depletion -Baseline creatinine is 0.9-1.1 -Continue intravenous fluids  Abdominal Pain -Pain seems to be out of proportion with examination -Suspect postoperative pain superimposed upon constipation--CT did suggest large stool burden -Started cathartics Melanotic stool  -appreciate GI  -plans noted for endoscopy -start clears -restart ASA when okay with GI -continue protonix drip Paroxysmal Atrial fibrillation - CHA2DS-Vasc score of 3 (age, hypertension) - Rate controlled  - Continue daily aspirin when ok with GI; not on other AC due to risk of falls  -05/18/2015 echocardiogram EF 45-50 percent, grade 1 DD Essential hypertension - Continue daily metoprolol succinate   Family Communication:   Pt at beside Disposition Plan:    Home when medically stable       Procedures/Studies: Dg Chest 2 View  05/16/2015   CLINICAL DATA:  Abdominal pain 4 days. Nausea and vomiting with constipation.  EXAM: CHEST  2 VIEW  COMPARISON:  03/18/2013 and 03/10/2013  FINDINGS: Lungs are hyperexpanded with flattening of the hemidiaphragms. There is no focal consolidation or effusion. There is borderline cardiomegaly. Calcified plaque is present over the thoracic aorta. Small nodular cluster over the lateral right midlung unchanged likely calcified granulomas. Remainder of the exam is unchanged.  IMPRESSION: No acute cardiopulmonary disease.  COPD.   Electronically Signed   By: Elberta Fortis M.D.   On: 05/16/2015 16:17   Ct Abdomen Pelvis W Contrast  05/24/2015   CLINICAL DATA:  Abdominal pain, nausea and vomiting. No bowel movement since a bilateral inguinal hernia repair 3 days ago.  EXAM: CT ABDOMEN AND PELVIS WITH CONTRAST  TECHNIQUE: Multidetector CT imaging of the abdomen and pelvis was performed using the standard protocol following bolus administration of intravenous contrast.  CONTRAST:  75mL OMNIPAQUE IOHEXOL 300 MG/ML  SOLN  COMPARISON:  05/16/2015.  FINDINGS: Dilated stomach filled with oral contrast and air with little change in degree of dilatation. Interval dilated, gas-filled loops of bowel in the anterior abdomen. These appear to represent a combination of dilated small bowel and sigmoid colon. The remainder of the colon and small bowel are not dilated. No free peritoneal fluid or air.  Interval repair of the previously seen bilateral inguinal hernias with associated postoperative air swelling in both inguinal canals. There is also postoperative air in the anterior abdominal wall at the level of the pelvis bilaterally.  Markedly enlarged, heterogeneous prostate gland is again demonstrated. Anterior bladder wall thickening is also again demonstrated without a focal mass. No hydronephrosis or hydroureter. A large left  renal cyst is  again demonstrated.  Unremarkable liver, spleen, pancreas, gallbladder and adrenal glands. No enlarged lymph nodes. Atheromatous arterial calcifications. Clear lung bases. Extensive lumbar and lower thoracic spine degenerative changes.  IMPRESSION: 1. Probable postoperative ileus involving central bowel loops. 2. Chronic gastric distention. 3. Status post surgically repaired bilateral inguinal hernias with expected postoperative changes. 4. Stable markedly enlarged and heterogeneous prostate gland. 5. Diffuse anterior bladder wall thickening, most likely due to chronic bladder outlet obstruction by the enlarged prostate gland.   Electronically Signed   By: Beckie Salts M.D.   On: 05/24/2015 15:36   Ct Abdomen Pelvis W Contrast  05/16/2015   CLINICAL DATA:  Severe lower abdominal pain for 4 days, constipation, history hypertension, former smoker, peptic ulcer disease  EXAM: CT ABDOMEN AND PELVIS WITH CONTRAST  TECHNIQUE: Multidetector CT imaging of the abdomen and pelvis was performed using the standard protocol following bolus administration of intravenous contrast. Sagittal and coronal MPR images reconstructed from axial data set.  CONTRAST:  OMNIPAQUE IOHEXOL 300 MG/ML SOLN IV. Dilute oral contrast.  COMPARISON:  09/29/2014  FINDINGS: Lung bases emphysematous but clear.  Large cyst arising from anterior aspect of mid LEFT kidney 7.5 x 5.4 x 7.1 cm.  Liver, spleen, pancreas, kidneys, and adrenal glands otherwise normal.  Stomach distended by contrast and food debris.  Appendix, cecum, and terminal ileum located within a large RIGHT inguinal hernia.  Numerous small bowel loops within a large LEFT inguinal hernia.  Bowel loops otherwise unremarkable without evidence of bowel obstruction.  Extensive atherosclerotic calcifications.  Marked prostatic enlargement, gland 6.5 x 4.9 x 6.3 cm.  No mass, adenopathy, free air or free fluid.  Osseous demineralization with degenerative disc and facet disease changes of  the lumbar spine.  IMPRESSION: Large LEFT renal cyst.  Small bowel loops within large LEFT inguinal hernia without obstruction.  Appendix, cecum and terminal ileum within large RIGHT inguinal hernia without obstruction.  Marked prostatic enlargement.  Extensive atherosclerotic disease.   Electronically Signed   By: Ulyses Southward M.D.   On: 05/16/2015 17:19   Dg Chest Port 1 View  05/24/2015   CLINICAL DATA:  81 year old male with abdominal pain, nausea, vomiting. Dark stools x1 week. Tachycardic. Possible sepsis. Initial encounter.  EXAM: PORTABLE CHEST - 1 VIEW  COMPARISON:  05/16/2015 and earlier.  FINDINGS: Portable AP semi upright view at 1739 hrs. Stable cardiomegaly and mediastinal contours. Chronic calcified aortic atherosclerosis. Stable small calcified granulomas and/or calcified pleural plaques in the right lung. Otherwise, allowing for portable technique, the lungs are clear. No pneumothorax, pneumoperitoneum, or pleural effusion identified. Advanced degenerative changes at the shoulders.  IMPRESSION: No acute cardiopulmonary abnormality.   Electronically Signed   By: Odessa Fleming M.D.   On: 05/24/2015 17:47   Dg Abd 2 Views  05/17/2015   CLINICAL DATA:  Inguinal hernia, mid abdominal pain.  EXAM: ABDOMEN - 2 VIEW  COMPARISON:  CT 05/16/2015  FINDINGS: Oral contrast material seen within the colon. Mild diffuse gaseous distention of bowel. No evidence of bowel obstruction. No free air. No organomegaly.  IMPRESSION: Mild diffuse gaseous distention of bowel, likely mild ileus. No evidence of bowel obstruction or free air.   Electronically Signed   By: Charlett Nose M.D.   On: 05/17/2015 12:06         Subjective: Patient complaint abdominal pain. He had a bowel movement last night without any bright red blood. Denies any chest discomfort, shortness breath, headache, fevers, chills, dysuria. Vomiting  appears to be improved, but continues to have nausea  Objective: Filed Vitals:   05/24/15 2326  05/25/15 0510 05/25/15 0734 05/25/15 0756  BP: 160/85 153/60 143/76   Pulse: 78 66 63   Temp: 98 F (36.7 C) 97.6 F (36.4 C)  97.9 F (36.6 C)  TempSrc: Oral Oral  Oral  Resp: 18 23    Height:      Weight:      SpO2: 100% 100% 100%     Intake/Output Summary (Last 24 hours) at 05/25/15 1144 Last data filed at 05/25/15 1113  Gross per 24 hour  Intake 2327.5 ml  Output    650 ml  Net 1677.5 ml   Weight change:  Exam:   General:  Pt is alert, follows commands appropriately, not in acute distress  HEENT: No icterus, No thrush, No neck mass, [place]/AT  Cardiovascular: RRR, S1/S2, no rubs, no gallops  Respiratory: CTA bilaterally, no wheezing, no crackles, no rhonchi  Abdomen: Soft/+BS, periumbilical tenderness without rebound, non distended, no guarding; surgical incisions well approximated without erythema or drainage.  Extremities: trace LE edema, No lymphangitis, No petechiae, No rashes, no synovitis; no cyanosis or clubbing  Data Reviewed: Basic Metabolic Panel:  Recent Labs Lab 05/21/15 0537 05/24/15 1135 05/24/15 1845 05/25/15 0238  NA 132* 137 137 137  K 4.4 5.1 4.7 4.7  CL 96* 100* 101 102  CO2 30 28 29 27   GLUCOSE 109* 132* 123* 96  BUN 13 16 14 12   CREATININE 1.05 1.34* 1.03 0.86  CALCIUM 8.4* 9.3 8.8* 8.4*   Liver Function Tests:  Recent Labs Lab 05/24/15 1135 05/24/15 1845  AST 36 23  ALT 24 20  ALKPHOS 106 91  BILITOT 0.6 0.7  PROT 7.2 5.9*  ALBUMIN 3.1* 2.6*   No results for input(s): LIPASE, AMYLASE in the last 168 hours. No results for input(s): AMMONIA in the last 168 hours. CBC:  Recent Labs Lab 05/21/15 0537 05/24/15 1135 05/24/15 1845 05/25/15 0238  WBC 9.8 18.5* 17.2* 14.3*  NEUTROABS  --   --  14.5*  --   HGB 10.5* 11.6* 10.5* 8.9*  HCT 31.7* 34.9* 32.0* 26.9*  MCV 83.9 83.5 82.1 83.0  PLT 355 449* 434* 431*   Cardiac Enzymes: No results for input(s): CKTOTAL, CKMB, CKMBINDEX, TROPONINI in the last 168  hours. BNP: Invalid input(s): POCBNP CBG: No results for input(s): GLUCAP in the last 168 hours.  Recent Results (from the past 240 hour(s))  MRSA PCR Screening     Status: None   Collection Time: 05/24/15  7:11 PM  Result Value Ref Range Status   MRSA by PCR NEGATIVE NEGATIVE Final    Comment:        The GeneXpert MRSA Assay (FDA approved for NASAL specimens only), is one component of a comprehensive MRSA colonization surveillance program. It is not intended to diagnose MRSA infection nor to guide or monitor treatment for MRSA infections.      Scheduled Meds: . antiseptic oral rinse  7 mL Mouth Rinse q12n4p  . cefTAZidime (FORTAZ)  IV  2 g Intravenous Q12H  . chlorhexidine  15 mL Mouth Rinse BID  . [START ON 05/28/2015] pantoprazole (PROTONIX) IV  40 mg Intravenous Q12H  . polyethylene glycol  17 g Oral Daily  . sodium chloride  3 mL Intravenous Q12H  . vancomycin  750 mg Intravenous Q24H   Continuous Infusions: . sodium chloride 75 mL/hr at 05/25/15 0600  . pantoprozole (PROTONIX) infusion 8 mg/hr (05/25/15  68)     Johntay Doolen, DO  [facility] Pager [phone]  If 7PM-7AM, please contact night-coverage [email or web address] Password [email or web address] 05/25/2015, 11:44 AM   LOS: 1 day

## 2015-05-25 NOTE — Progress Notes (Signed)
Subjective: No acute changes overnight  Objective: Vital signs in last 24 hours: Temp:  [97.6 F (36.4 C)-99.2 F (37.3 C)] 97.9 F (36.6 C) (09/05 0756) Pulse Rate:  [33-104] 63 (09/05 0734) Resp:  [12-26] 23 (09/05 0510) BP: (113-160)/(57-91) 143/76 mmHg (09/05 0734) SpO2:  [94 %-100 %] 100 % (09/05 0734) Weight:  [57.1 kg (125 lb 14.1 oz)] 57.1 kg (125 lb 14.1 oz) (09/04 1725) Last BM Date: 05/24/15  Intake/Output from previous day: 09/04 0701 - 09/05 0700 In: 2327.5 [P.O.:30; I.V.:1097.5; IV Piggyback:1200] Out: 400 [Urine:400] Intake/Output this shift: Total I/O In: -  Out: 150 [Urine:150]  General appearance: alert and cooperative GI: soft, non-tender; bowel sounds normal; no masses,  no organomegaly and wouds c/d/i  Lab Results:   Recent Labs  05/24/15 1845 05/25/15 0238  WBC 17.2* 14.3*  HGB 10.5* 8.9*  HCT 32.0* 26.9*  PLT 434* 431*   BMET  Recent Labs  05/24/15 1845 05/25/15 0238  NA 137 137  K 4.7 4.7  CL 101 102  CO2 29 27  GLUCOSE 123* 96  BUN 14 12  CREATININE 1.03 0.86  CALCIUM 8.8* 8.4*   PT/INR  Recent Labs  05/24/15 1845  LABPROT 15.3*  INR 1.20   ABG No results for input(s): PHART, HCO3 in the last 72 hours.  Invalid input(s): PCO2, PO2  Studies/Results: Ct Abdomen Pelvis W Contrast  05/24/2015   CLINICAL DATA:  Abdominal pain, nausea and vomiting. No bowel movement since a bilateral inguinal hernia repair 3 days ago.  EXAM: CT ABDOMEN AND PELVIS WITH CONTRAST  TECHNIQUE: Multidetector CT imaging of the abdomen and pelvis was performed using the standard protocol following bolus administration of intravenous contrast.  CONTRAST:  75mL OMNIPAQUE IOHEXOL 300 MG/ML  SOLN  COMPARISON:  05/16/2015.  FINDINGS: Dilated stomach filled with oral contrast and air with little change in degree of dilatation. Interval dilated, gas-filled loops of bowel in the anterior abdomen. These appear to represent a combination of dilated small bowel  and sigmoid colon. The remainder of the colon and small bowel are not dilated. No free peritoneal fluid or air.  Interval repair of the previously seen bilateral inguinal hernias with associated postoperative air swelling in both inguinal canals. There is also postoperative air in the anterior abdominal wall at the level of the pelvis bilaterally.  Markedly enlarged, heterogeneous prostate gland is again demonstrated. Anterior bladder wall thickening is also again demonstrated without a focal mass. No hydronephrosis or hydroureter. A large left renal cyst is again demonstrated.  Unremarkable liver, spleen, pancreas, gallbladder and adrenal glands. No enlarged lymph nodes. Atheromatous arterial calcifications. Clear lung bases. Extensive lumbar and lower thoracic spine degenerative changes.  IMPRESSION: 1. Probable postoperative ileus involving central bowel loops. 2. Chronic gastric distention. 3. Status post surgically repaired bilateral inguinal hernias with expected postoperative changes. 4. Stable markedly enlarged and heterogeneous prostate gland. 5. Diffuse anterior bladder wall thickening, most likely due to chronic bladder outlet obstruction by the enlarged prostate gland.   Electronically Signed   By: Beckie Salts M.D.   On: 05/24/2015 15:36   Dg Chest Port 1 View  05/24/2015   CLINICAL DATA:  81 year old male with abdominal pain, nausea, vomiting. Dark stools x1 week. Tachycardic. Possible sepsis. Initial encounter.  EXAM: PORTABLE CHEST - 1 VIEW  COMPARISON:  05/16/2015 and earlier.  FINDINGS: Portable AP semi upright view at 1739 hrs. Stable cardiomegaly and mediastinal contours. Chronic calcified aortic atherosclerosis. Stable small calcified granulomas and/or calcified pleural plaques in  the right lung. Otherwise, allowing for portable technique, the lungs are clear. No pneumothorax, pneumoperitoneum, or pleural effusion identified. Advanced degenerative changes at the shoulders.  IMPRESSION: No  acute cardiopulmonary abnormality.   Electronically Signed   By: Odessa Fleming M.D.   On: 05/24/2015 17:47    Anti-infectives: Anti-infectives    Start     Dose/Rate Route Frequency Ordered Stop   05/25/15 1800  vancomycin (VANCOCIN) 500 mg in sodium chloride 0.9 % 100 mL IVPB     500 mg 100 mL/hr over 60 Minutes Intravenous Every 24 hours 05/24/15 1754     05/24/15 1800  cefTAZidime (FORTAZ) 2 g in dextrose 5 % 50 mL IVPB     2 g 100 mL/hr over 30 Minutes Intravenous Every 24 hours 05/24/15 1754     05/24/15 1730  vancomycin (VANCOCIN) IVPB 1000 mg/200 mL premix     1,000 mg 200 mL/hr over 60 Minutes Intravenous  Once 05/24/15 1725 05/24/15 2019      Assessment/Plan: POD 5 s/p open BIHR with mesh Patient with large stool burden, would recommend MiraLAX. Hernia wounds appear to be healing well. No signs of complication. Please have him follow-up as directed previously on discharge. Please call with any questions.  LOS: 1 day    Marigene Ehlers., Jed Limerick 05/25/2015

## 2015-05-25 NOTE — Progress Notes (Signed)
Admission note:  Arrival Method: wheelchair  Mental Orientation: alert & oriented x 3  Telemetry: box # 30 applied  Assessment: completed by Maralyn Sago, RN  Skin: intact  IV: right & left FA  Pain: pt denies  Tubes: N/A Safety Measures:Discussed with pt; bed alarm applied  6E Orientation: Patient has been oriented to the unit, staff and to the room.    Quinita Kostelecky [employer], RN [employer] Phone [phone]

## 2015-05-25 NOTE — Consult Note (Signed)
Reason for Consult: GI bleeding abnormal CAT scan abdominal pain Referring Physician: Hospital team  Luis Norton is an 81 y.o. male.  HPI: Patient seen and examined and his hospital computer chart was reviewed as was his office computer chart and he says he said problems ever since his hernia surgery with 3 days of black stools and increasing abdominal pain and his 2 CTs were reviewed and he denies much upper tract symptoms and does not remember ever having a colonoscopy but does have a history of ulcers on his computer chart and has not had a bowel movement today and has no other complaints and has been on an aspirin a day but no other obvious blood thinners  Past Medical History  Diagnosis Date  . Hypertension   . Hypercholesterolemia   . ED (erectile dysfunction)   . Urinary frequency     bladder instablity and outlet obstruction   . Constipation   . PUD (peptic ulcer disease)     history of   . BPH (benign prostatic hyperplasia)   . Atrial fibrillation     Past Surgical History  Procedure Laterality Date  . Ankle surgery Right   . Inguinal hernia repair Bilateral 05/20/2015    Procedure: HERNIA REPAIR INGUINAL ADULT BILATERAL;  Surgeon: Coralie Keens, MD;  Location: [facility];  Service: General;  Laterality: Bilateral;  . Insertion of mesh Bilateral 05/20/2015    Procedure: INSERTION OF MESH;  Surgeon: Coralie Keens, MD;  Location: [facility];  Service: General;  Laterality: Bilateral;    History reviewed. No pertinent family history.  Social History:  reports that he has quit smoking. He does not have any smokeless tobacco history on file. He reports that he does not drink alcohol or use illicit drugs.  Allergies:  Allergies  Allergen Reactions  . Penicillins Swelling    Medications: I have reviewed the patient's current medications.  Results for orders placed or performed during the hospital encounter of 05/24/15 (from the past 48 hour(s))  Comprehensive metabolic panel      Status: Abnormal   Collection Time: 05/24/15 11:35 AM  Result Value Ref Range   Sodium 137 135 - 145 mmol/L   Potassium 5.1 3.5 - 5.1 mmol/L   Chloride 100 (L) 101 - 111 mmol/L   CO2 28 22 - 32 mmol/L   Glucose, Bld 132 (H) 65 - 99 mg/dL   BUN 16 6 - 20 mg/dL   Creatinine, Ser 1.34 (H) 0.61 - 1.24 mg/dL   Calcium 9.3 8.9 - 10.3 mg/dL   Total Protein 7.2 6.5 - 8.1 g/dL   Albumin 3.1 (L) 3.5 - 5.0 g/dL   AST 36 15 - 41 U/L   ALT 24 17 - 63 U/L   Alkaline Phosphatase 106 38 - 126 U/L   Total Bilirubin 0.6 0.3 - 1.2 mg/dL   GFR calc non Af Amer 45 (L) >60 mL/min   GFR calc Af Amer 52 (L) >60 mL/min    Comment: (NOTE) The eGFR has been calculated using the CKD EPI equation. This calculation has not been validated in all clinical situations. eGFR's persistently <60 mL/min signify possible Chronic Kidney Disease.    Anion gap 9 5 - 15  CBC     Status: Abnormal   Collection Time: 05/24/15 11:35 AM  Result Value Ref Range   WBC 18.5 (H) 4.0 - 10.5 K/uL   RBC 4.18 (L) 4.22 - 5.81 MIL/uL   Hemoglobin 11.6 (L) 13.0 - 17.0 g/dL   HCT  34.9 (L) 39.0 - 52.0 %   MCV 83.5 78.0 - 100.0 fL   MCH 27.8 26.0 - 34.0 pg   MCHC 33.2 30.0 - 36.0 g/dL   RDW 13.3 11.5 - 15.5 %   Platelets 449 (H) 150 - 400 K/uL  I-Stat Troponin, ED (not at [facility])     Status: None   Collection Time: 05/24/15 11:45 AM  Result Value Ref Range   Troponin i, poc 0.02 0.00 - 0.08 ng/mL   Comment 3            Comment: Due to the release kinetics of cTnI, a negative result within the first hours of the onset of symptoms does not rule out myocardial infarction with certainty. If myocardial infarction is still suspected, repeat the test at appropriate intervals.   POC occult blood, ED Provider will collect     Status: Abnormal   Collection Time: 05/24/15 12:07 PM  Result Value Ref Range   Fecal Occult Bld POSITIVE (A) NEGATIVE  Urinalysis, Routine w reflex microscopic (not at [facility])     Status: Abnormal   Collection  Time: 05/24/15  2:55 PM  Result Value Ref Range   Color, Urine YELLOW YELLOW   APPearance HAZY (A) CLEAR   Specific Gravity, Urine 1.013 1.005 - 1.030   pH 8.5 (H) 5.0 - 8.0   Glucose, UA NEGATIVE NEGATIVE mg/dL   Hgb urine dipstick NEGATIVE NEGATIVE   Bilirubin Urine NEGATIVE NEGATIVE   Ketones, ur NEGATIVE NEGATIVE mg/dL   Protein, ur NEGATIVE NEGATIVE mg/dL   Urobilinogen, UA 1.0 0.0 - 1.0 mg/dL   Nitrite NEGATIVE NEGATIVE   Leukocytes, UA NEGATIVE NEGATIVE    Comment: MICROSCOPIC NOT DONE ON URINES WITH NEGATIVE PROTEIN, BLOOD, LEUKOCYTES, NITRITE, OR GLUCOSE <1000 mg/dL.  Type and screen     Status: None   Collection Time: 05/24/15  6:45 PM  Result Value Ref Range   ABO/RH(D) O POS    Antibody Screen NEG    Sample Expiration 05/27/2015   CBC with Differential     Status: Abnormal   Collection Time: 05/24/15  6:45 PM  Result Value Ref Range   WBC 17.2 (H) 4.0 - 10.5 K/uL   RBC 3.90 (L) 4.22 - 5.81 MIL/uL   Hemoglobin 10.5 (L) 13.0 - 17.0 g/dL   HCT 32.0 (L) 39.0 - 52.0 %   MCV 82.1 78.0 - 100.0 fL   MCH 26.9 26.0 - 34.0 pg   MCHC 32.8 30.0 - 36.0 g/dL   RDW 13.2 11.5 - 15.5 %   Platelets 434 (H) 150 - 400 K/uL   Neutrophils Relative % 85 (H) 43 - 77 %   Neutro Abs 14.5 (H) 1.7 - 7.7 K/uL   Lymphocytes Relative 8 (L) 12 - 46 %   Lymphs Abs 1.3 0.7 - 4.0 K/uL   Monocytes Relative 7 3 - 12 %   Monocytes Absolute 1.2 (H) 0.1 - 1.0 K/uL   Eosinophils Relative 0 0 - 5 %   Eosinophils Absolute 0.0 0.0 - 0.7 K/uL   Basophils Relative 0 0 - 1 %   Basophils Absolute 0.0 0.0 - 0.1 K/uL  Comprehensive metabolic panel     Status: Abnormal   Collection Time: 05/24/15  6:45 PM  Result Value Ref Range   Sodium 137 135 - 145 mmol/L   Potassium 4.7 3.5 - 5.1 mmol/L   Chloride 101 101 - 111 mmol/L   CO2 29 22 - 32 mmol/L   Glucose, Bld 123 (H)  65 - 99 mg/dL   BUN 14 6 - 20 mg/dL   Creatinine, Ser 1.03 0.61 - 1.24 mg/dL   Calcium 8.8 (L) 8.9 - 10.3 mg/dL   Total Protein 5.9 (L)  6.5 - 8.1 g/dL   Albumin 2.6 (L) 3.5 - 5.0 g/dL   AST 23 15 - 41 U/L   ALT 20 17 - 63 U/L   Alkaline Phosphatase 91 38 - 126 U/L   Total Bilirubin 0.7 0.3 - 1.2 mg/dL   GFR calc non Af Amer >60 >60 mL/min   GFR calc Af Amer >60 >60 mL/min    Comment: (NOTE) The eGFR has been calculated using the CKD EPI equation. This calculation has not been validated in all clinical situations. eGFR's persistently <60 mL/min signify possible Chronic Kidney Disease.    Anion gap 7 5 - 15  Lactic acid, plasma     Status: None   Collection Time: 05/24/15  6:45 PM  Result Value Ref Range   Lactic Acid, Venous 1.5 0.5 - 2.0 mmol/L  Procalcitonin     Status: None   Collection Time: 05/24/15  6:45 PM  Result Value Ref Range   Procalcitonin <0.10 ng/mL    Comment:        Interpretation: PCT (Procalcitonin) <= 0.5 ng/mL: Systemic infection (sepsis) is not likely. Local bacterial infection is possible. (NOTE)         ICU PCT Algorithm               Non ICU PCT Algorithm    ----------------------------     ------------------------------         PCT < 0.25 ng/mL                 PCT < 0.1 ng/mL     Stopping of antibiotics            Stopping of antibiotics       strongly encouraged.               strongly encouraged.    ----------------------------     ------------------------------       PCT level decrease by               PCT < 0.25 ng/mL       >= 80% from peak PCT       OR PCT 0.25 - 0.5 ng/mL          Stopping of antibiotics                                             encouraged.     Stopping of antibiotics           encouraged.    ----------------------------     ------------------------------       PCT level decrease by              PCT >= 0.25 ng/mL       < 80% from peak PCT        AND PCT >= 0.5 ng/mL            Continuin g antibiotics  encouraged.       Continuing antibiotics            encouraged.    ----------------------------      ------------------------------     PCT level increase compared          PCT > 0.5 ng/mL         with peak PCT AND          PCT >= 0.5 ng/mL             Escalation of antibiotics                                          strongly encouraged.      Escalation of antibiotics        strongly encouraged.   Protime-INR     Status: Abnormal   Collection Time: 05/24/15  6:45 PM  Result Value Ref Range   Prothrombin Time 15.3 (H) 11.6 - 15.2 seconds   INR 1.20 0.00 - 1.49  APTT     Status: None   Collection Time: 05/24/15  6:45 PM  Result Value Ref Range   aPTT 31 24 - 37 seconds  ABO/Rh     Status: None   Collection Time: 05/24/15  6:45 PM  Result Value Ref Range   ABO/RH(D) O POS   MRSA PCR Screening     Status: None   Collection Time: 05/24/15  7:11 PM  Result Value Ref Range   MRSA by PCR NEGATIVE NEGATIVE    Comment:        The GeneXpert MRSA Assay (FDA approved for NASAL specimens only), is one component of a comprehensive MRSA colonization surveillance program. It is not intended to diagnose MRSA infection nor to guide or monitor treatment for MRSA infections.   Lactic acid, plasma     Status: None   Collection Time: 05/24/15  8:30 PM  Result Value Ref Range   Lactic Acid, Venous 1.4 0.5 - 2.0 mmol/L  Basic metabolic panel     Status: Abnormal   Collection Time: 05/25/15  2:38 AM  Result Value Ref Range   Sodium 137 135 - 145 mmol/L   Potassium 4.7 3.5 - 5.1 mmol/L   Chloride 102 101 - 111 mmol/L   CO2 27 22 - 32 mmol/L   Glucose, Bld 96 65 - 99 mg/dL   BUN 12 6 - 20 mg/dL   Creatinine, Ser 0.86 0.61 - 1.24 mg/dL   Calcium 8.4 (L) 8.9 - 10.3 mg/dL   GFR calc non Af Amer >60 >60 mL/min   GFR calc Af Amer >60 >60 mL/min    Comment: (NOTE) The eGFR has been calculated using the CKD EPI equation. This calculation has not been validated in all clinical situations. eGFR's persistently <60 mL/min signify possible Chronic Kidney Disease.    Anion gap 8 5 - 15  CBC      Status: Abnormal   Collection Time: 05/25/15  2:38 AM  Result Value Ref Range   WBC 14.3 (H) 4.0 - 10.5 K/uL   RBC 3.24 (L) 4.22 - 5.81 MIL/uL   Hemoglobin 8.9 (L) 13.0 - 17.0 g/dL   HCT 26.9 (L) 39.0 - 52.0 %   MCV 83.0 78.0 - 100.0 fL   MCH 27.5 26.0 - 34.0 pg   MCHC 33.1 30.0 - 36.0 g/dL   RDW 13.4 11.5 - 15.5 %  Platelets 431 (H) 150 - 400 K/uL    Ct Abdomen Pelvis W Contrast  05/24/2015   CLINICAL DATA:  Abdominal pain, nausea and vomiting. No bowel movement since a bilateral inguinal hernia repair 3 days ago.  EXAM: CT ABDOMEN AND PELVIS WITH CONTRAST  TECHNIQUE: Multidetector CT imaging of the abdomen and pelvis was performed using the standard protocol following bolus administration of intravenous contrast.  CONTRAST:  7m OMNIPAQUE IOHEXOL 300 MG/ML  SOLN  COMPARISON:  05/16/2015.  FINDINGS: Dilated stomach filled with oral contrast and air with little change in degree of dilatation. Interval dilated, gas-filled loops of bowel in the anterior abdomen. These appear to represent a combination of dilated small bowel and sigmoid colon. The remainder of the colon and small bowel are not dilated. No free peritoneal fluid or air.  Interval repair of the previously seen bilateral inguinal hernias with associated postoperative air swelling in both inguinal canals. There is also postoperative air in the anterior abdominal wall at the level of the pelvis bilaterally.  Markedly enlarged, heterogeneous prostate gland is again demonstrated. Anterior bladder wall thickening is also again demonstrated without a focal mass. No hydronephrosis or hydroureter. A large left renal cyst is again demonstrated.  Unremarkable liver, spleen, pancreas, gallbladder and adrenal glands. No enlarged lymph nodes. Atheromatous arterial calcifications. Clear lung bases. Extensive lumbar and lower thoracic spine degenerative changes.  IMPRESSION: 1. Probable postoperative ileus involving central bowel loops. 2. Chronic gastric  distention. 3. Status post surgically repaired bilateral inguinal hernias with expected postoperative changes. 4. Stable markedly enlarged and heterogeneous prostate gland. 5. Diffuse anterior bladder wall thickening, most likely due to chronic bladder outlet obstruction by the enlarged prostate gland.   Electronically Signed   By: SClaudie ReveringM.D.   On: 05/24/2015 15:36   Dg Chest Port 1 View  05/24/2015   CLINICAL DATA:  81year old male with abdominal pain, nausea, vomiting. Dark stools x1 week. Tachycardic. Possible sepsis. Initial encounter.  EXAM: PORTABLE CHEST - 1 VIEW  COMPARISON:  05/16/2015 and earlier.  FINDINGS: Portable AP semi upright view at 1739 hrs. Stable cardiomegaly and mediastinal contours. Chronic calcified aortic atherosclerosis. Stable small calcified granulomas and/or calcified pleural plaques in the right lung. Otherwise, allowing for portable technique, the lungs are clear. No pneumothorax, pneumoperitoneum, or pleural effusion identified. Advanced degenerative changes at the shoulders.  IMPRESSION: No acute cardiopulmonary abnormality.   Electronically Signed   By: HGenevie AnnM.D.   On: 05/24/2015 17:47    ROS negative except above Blood pressure 143/76, pulse 63, temperature 97.9 F (36.6 C), temperature source Oral, resp. rate 23, height _0  (1.676 m), weight 57.1 kg (125 lb 14.1 oz), SpO2 100 %. Physical Exam vital signs stable afebrile no acute distress decreased breath sounds decreased heart sounds abdomen little distended mildly tender throughout no guarding or rebound labs and CT reviewed white count improved   Assessment/Plan: patient with multiple medical problems including postop abdominal pain probably due to ileus but now with GI bleeding on aspirin   Plan: Will allow clear liquid diet today and proceed with an endoscopy tomorrow because of abnormal CT and bleeding just to be sure although he probably has some degree of ischemic colitis from his surgery and agree  with MiraLAX in the meantime  MMary [facility] 05/25/2015, 10:58 AM

## 2015-05-25 NOTE — Consult Note (Signed)
Consultation Note Date: 05/25/2015   Patient Name: Luis Norton  DOB: 21-Jun-1922  MRN: [id number]  Age / Sex: 81 y.o., male   PCP: Renford Dills, MD Referring Physician: Catarina Hartshorn, MD  Reason for Consultation: Establishing goals of care, Non pain symptom management, Pain control and Psychosocial/spiritual support  Palliative Care Assessment and Plan Summary of Established Goals of Care and Medical Treatment Preferences    Palliative Care Discussion Held Today:    This NP [name] reviewed medical records, received report from team, assessed the patient and then meet at the patient's bedside along with his family to include [name]/neice/patietn lives with her, [name] and [name], and [name]/step-daughter  to discuss diagnosis, prognosis, GOC, EOL wishes disposition and options.  A detailed discussion was had today regarding advanced directives.  Concepts specific to code status, artifical feeding and hydration, continued IV antibiotics and rehospitalization was had.  The difference between a aggressive medical intervention path  and a palliative comfort care path for this patient at this time was had.  Values and goals of care important to patient and family were attempted to be elicited.  Concept of Hospice and Palliative Care were discussed  Natural trajectory and expectations at EOL were discussed.  Questions and concerns addressed.  Hard Choices booklet left for review. Family encouraged to call with questions or concerns.  PMT will continue to support holistically.  MOST form introduced  Primary Decision Maker: Family as a whole, no designated HPOA  Goals of Care/Code Status/Advance Care Planning:  Patient was able to help family with decisions regarding code status and artifical feeding.  Three family members totally support decision for limited code and no artifical feeding but step-daughter [name] "will go along with it but  Thinks  everything should be done"  All remain hopeful for improvement but all are worried that with his increased care needs return to his pre hospital  Living situation will not be realistic.  Patient has had continued physical, functional and cognitive decline over the past year   Code Status:  Partial   Artificial feeding: no artifical feeding now or in the future  Antibiotics: yes  Diagnostics:yes   Rehospitalization: yes   Psycho-social/Spiritual:   Support System: family   Prognosis: Unable to determine  Discharge Planning:  Skilled Nursing Facility for rehab with Palliative care service follow-up       Chief Complaint:   Abdominal pain and nausea  History of Present Illness:  81 y.o. male with a past medical history of cognitive impairment, history of atrial fibrillation, hypertension, dyslipidemia, peptic ulcer disease who was recently admitted to the medicine service on 05/16/2015 and discharged on 05/22/2015 hospitalized at the time for incarcerated bilateral inguinal hernias. He underwent bilateral inguinal hernia repair on 05/20/2015. He was discharged in stable condition with outpatient follow-up with Dr. Magnus Ivan of general surgery.   He presents to the emergency department with complaints of abdominal pain associated with multiple episodes of nausea and vomiting. He describes his abdominal pain as sharp and stabbing over generalize abdomen. He also complains of having dark tarry stools over the past week. In the emergency department found to be Hemoccult-positive. A CT scan of abdomen and pelvis done in the ER revealed findings suggestive of postoperative ileus. Blood work showing elevated white count of 18,500, increased from 9800 on 05/21/2015.   Admitted for treatment and stabilization.  Primary Diagnoses  Present on Admission:  . Sepsis . Atrial fibrillation with RVR . Ileus, postoperative .  PUD (peptic ulcer disease) . Upper GI bleed  Palliative Review of  Systems:    -weakness, abdominal pain   I have reviewed the medical record, interviewed the patient and family, and examined the patient. The following aspects are pertinent.  Past Medical History  Diagnosis Date  . Hypertension   . Hypercholesterolemia   . ED (erectile dysfunction)   . Urinary frequency     bladder instablity and outlet obstruction   . Constipation   . PUD (peptic ulcer disease)     history of   . BPH (benign prostatic hyperplasia)   . Atrial fibrillation    Social History   Social History  . Marital Status: Married    Spouse Name: N/A  . Number of Children: N/A  . Years of Education: N/A   Social History Main Topics  . Smoking status: Former Games developer  . Smokeless tobacco: None  . Alcohol Use: No  . Drug Use: No  . Sexual Activity: Not Asked   Other Topics Concern  . None   Social History Narrative   History reviewed. No pertinent family history. Scheduled Meds: . antiseptic oral rinse  7 mL Mouth Rinse q12n4p  . cefTAZidime (FORTAZ)  IV  2 g Intravenous Q24H  . chlorhexidine  15 mL Mouth Rinse BID  . [START ON 05/28/2015] pantoprazole (PROTONIX) IV  40 mg Intravenous Q12H  . sodium chloride  3 mL Intravenous Q12H  . vancomycin  500 mg Intravenous Q24H   Continuous Infusions: . sodium chloride 75 mL/hr at 05/25/15 0600  . pantoprozole (PROTONIX) infusion 8 mg/hr (05/25/15 0647)   PRN Meds:.acetaminophen **OR** acetaminophen, metoprolol, morphine injection, ondansetron **OR** ondansetron (ZOFRAN) IV Medications Prior to Admission:  Prior to Admission medications   Medication Sig Start Date End Date Taking? Authorizing Provider  aspirin EC 81 MG EC tablet Take 1 tablet (81 mg total) by mouth daily. 05/22/15  Yes Clydia Llano, MD  bisacodyl (DULCOLAX) 5 MG EC tablet 1 tab q hs Prn constipation Patient taking differently: Take 5 mg by mouth at bedtime as needed for mild constipation.  09/23/13  Yes Hayden Rasmussen, NP  traMADol (ULTRAM) 50 MG tablet Take  0.5-1 tablets (25-50 mg total) by mouth every 6 (six) hours as needed for severe pain. 05/22/15  Yes Clydia Llano, MD  metoprolol succinate (TOPROL-XL) 25 MG 24 hr tablet Take 1 tablet (25 mg total) by mouth daily. Patient not taking: Reported on 05/24/2015 05/22/15   Clydia Llano, MD   Allergies  Allergen Reactions  . Penicillins Swelling   CBC:    Component Value Date/Time   WBC 14.3* 05/25/2015 0238   HGB 8.9* 05/25/2015 0238   HCT 26.9* 05/25/2015 0238   PLT 431* 05/25/2015 0238   MCV 83.0 05/25/2015 0238   NEUTROABS 14.5* 05/24/2015 1845   LYMPHSABS 1.3 05/24/2015 1845   MONOABS 1.2* 05/24/2015 1845   EOSABS 0.0 05/24/2015 1845   BASOSABS 0.0 05/24/2015 1845   Comprehensive Metabolic Panel:    Component Value Date/Time   NA 137 05/25/2015 0238   K 4.7 05/25/2015 0238   CL 102 05/25/2015 0238   CO2 27 05/25/2015 0238   BUN 12 05/25/2015 0238   CREATININE 0.86 05/25/2015 0238   GLUCOSE 96 05/25/2015 0238   CALCIUM 8.4* 05/25/2015 0238   AST 23 05/24/2015 1845   ALT 20 05/24/2015 1845   ALKPHOS 91 05/24/2015 1845   BILITOT 0.7 05/24/2015 1845   PROT 5.9* 05/24/2015 1845   ALBUMIN 2.6* 05/24/2015 1845  Physical Exam:  Vital Signs: BP 143/76 mmHg  Pulse 63  Temp(Src) 97.9 F (36.6 C) (Oral)  Resp 23  Ht 5\' 6"  (1.676 m)  Wt 57.1 kg (125 lb 14.1 oz)  BMI 20.33 kg/m2  SpO2 100% SpO2: SpO2: 100 % O2 Device: O2 Device: Not Delivered O2 Flow Rate:   Intake/output summary:  Intake/Output Summary (Last 24 hours) at 05/25/15 0844 Last data filed at 05/25/15 0758  Gross per 24 hour  Intake 2327.5 ml  Output    450 ml  Net 1877.5 ml   LBM: Last BM Date: 05/24/15 Baseline Weight: Weight: 57.1 kg (125 lb 14.1 oz) Most recent weight: Weight: 57.1 kg (125 lb 14.1 oz)  Exam Findings:   General: chronically ill appearing, OOB to cahirs,  HEENT: moist buccal membranes, no exudate CVS: RRR Resp: CTA Abd: noted surgical  Scar, CDC, tender on gentle exam Skin: warm  and dry Neuro: oriented to person and place   PPS 30 %               Additional Data Reviewed: Recent Labs     05/24/15  1845  05/25/15  0238  WBC  17.2*  14.3*  HGB  10.5*  8.9*  PLT  434*  431*  NA  137  137  BUN  14  12  CREATININE  1.03  0.86     Time In: 1200 Time Out: 1330 Time Total: 90 min  Greater than 50%  of this time was spent counseling and coordinating care related to the above assessment and plan.  Discussed with  nursing  Signed by: [name], NP  Canary Brim, NP  05/25/2015, 8:44 AM  Please contact Palliative Medicine Team phone at [phone] for questions and concerns.   See AMION for contact information

## 2015-05-25 NOTE — Progress Notes (Signed)
ANTIBIOTIC CONSULT NOTE - FOLLOW UP  Pharmacy Consult for Vancomycin and Ceftazidime Indication: sepsis  Allergies  Allergen Reactions  . Penicillins Swelling    Patient Measurements: Height:  (167.6 cm) Weight: 125 lb 14.1 oz (57.1 kg) IBW/kg (Calculated) : 63.8  Vital Signs: Temp: 97.9 F (36.6 C) (09/05 0756) Temp Source: Oral (09/05 0756) BP: 143/76 mmHg (09/05 0734) Pulse Rate: 63 (09/05 0734) Intake/Output from previous day: 09/04 0701 - 09/05 0700 In: 2327.5 [P.O.:30; I.V.:1097.5; IV Piggyback:1200] Out: 400 [Urine:400] Intake/Output from this shift: Total I/O In: -  Out: 150 [Urine:150]  Labs:  Recent Labs  05/24/15 1135 05/24/15 1845 05/25/15 0238  WBC 18.5* 17.2* 14.3*  HGB 11.6* 10.5* 8.9*  PLT 449* 434* 431*  CREATININE 1.34* 1.03 0.86   Estimated Creatinine Clearance: 46.1 mL/min (by C-G formula based on Cr of 0.86). No results for input(s): VANCOTROUGH, VANCOPEAK, VANCORANDOM, GENTTROUGH, GENTPEAK, GENTRANDOM, TOBRATROUGH, TOBRAPEAK, TOBRARND, AMIKACINPEAK, AMIKACINTROU, AMIKACIN in the last 72 hours.   Microbiology: Recent Results (from the past 720 hour(s))  MRSA PCR Screening     Status: None   Collection Time: 05/24/15  7:11 PM  Result Value Ref Range Status   MRSA by PCR NEGATIVE NEGATIVE Final    Comment:        The GeneXpert MRSA Assay (FDA approved for NASAL specimens only), is one component of a comprehensive MRSA colonization surveillance program. It is not intended to diagnose MRSA infection nor to guide or monitor treatment for MRSA infections.     Anti-infectives    Start     Dose/Rate Route Frequency Ordered Stop   05/25/15 1800  vancomycin (VANCOCIN) 500 mg in sodium chloride 0.9 % 100 mL IVPB     500 mg 100 mL/hr over 60 Minutes Intravenous Every 24 hours 05/24/15 1754     05/24/15 1800  cefTAZidime (FORTAZ) 2 g in dextrose 5 % 50 mL IVPB     2 g 100 mL/hr over 30 Minutes Intravenous Every 24 hours 05/24/15 1754      05/24/15 1730  vancomycin (VANCOCIN) IVPB 1000 mg/200 mL premix     1,000 mg 200 mL/hr over 60 Minutes Intravenous  Once 05/24/15 1725 05/24/15 2019      Assessment: 81 year old male admitted with sepsis.  He had acute renal failure on admission that appears to be improving based on declining SCr.  His antibiotic regimens require adjustment for his renal function.  Goal of Therapy:  Vancomycin trough level 15-20 mcg/ml  Plan:  Change Vancomycin to  IV q24h Change Ceftazidime to 2g IV q12h Monitor renal function closely Follow any available micro data  Luis Norton, Pharm.D., BCPS, AAHIVP Clinical Pharmacist Phone: [phone] or [phone] 05/25/2015, 10:12 AM

## 2015-05-25 NOTE — Progress Notes (Signed)
Report called to Sarah, RN

## 2015-05-26 ENCOUNTER — Encounter (HOSPITAL_COMMUNITY)
Admission: EM | Disposition: A | Discharge status: Skilled Nursing Facility | Payer: Self-pay | Source: Home / Self Care | Attending: Internal Medicine

## 2015-05-26 ENCOUNTER — Encounter (HOSPITAL_COMMUNITY): Payer: Self-pay | Admitting: *Deleted

## 2015-05-26 DIAGNOSIS — I480 Paroxysmal atrial fibrillation: Secondary | ICD-10-CM

## 2015-05-26 HISTORY — PX: ESOPHAGOGASTRODUODENOSCOPY: SHX5428

## 2015-05-26 LAB — COMPREHENSIVE METABOLIC PANEL
ALK PHOS: 79 U/L (ref 38–126)
ALT: 18 U/L (ref 17–63)
ANION GAP: 8 (ref 5–15)
AST: 21 U/L (ref 15–41)
Albumin: 2.3 g/dL — ABNORMAL LOW (ref 3.5–5.0)
BILIRUBIN TOTAL: 0.6 mg/dL (ref 0.3–1.2)
BUN: <5 mg/dL — ABNORMAL LOW (ref 6–20)
CALCIUM: 8.6 mg/dL — AB (ref 8.9–10.3)
CO2: 24 mmol/L (ref 22–32)
Chloride: 108 mmol/L (ref 101–111)
Creatinine, Ser: 0.89 mg/dL (ref 0.61–1.24)
GFR calc Af Amer: >60 mL/min (ref >60)
GLUCOSE: 75 mg/dL (ref 65–99)
POTASSIUM: 4.3 mmol/L (ref 3.5–5.1)
Sodium: 140 mmol/L (ref 135–145)
TOTAL PROTEIN: 5.6 g/dL — AB (ref 6.5–8.1)

## 2015-05-26 LAB — CBC
HEMATOCRIT: 30.1 % — AB (ref 39.0–52.0)
HEMOGLOBIN: 10 g/dL — AB (ref 13.0–17.0)
MCH: 27.3 pg (ref 26.0–34.0)
MCHC: 33.2 g/dL (ref 30.0–36.0)
MCV: 82.2 fL (ref 78.0–100.0)
Platelets: 413 10*3/uL — ABNORMAL HIGH (ref 150–400)
RBC: 3.66 MIL/uL — ABNORMAL LOW (ref 4.22–5.81)
RDW: 13.5 % (ref 11.5–15.5)
WBC: 8.4 10*3/uL (ref 4.0–10.5)

## 2015-05-26 SURGERY — EGD (ESOPHAGOGASTRODUODENOSCOPY)
Anesthesia: Moderate Sedation

## 2015-05-26 MED ORDER — FENTANYL CITRATE (PF) 100 MCG/2ML IJ SOLN
INTRAMUSCULAR | Status: AC
  Filled 2015-05-26: qty 2

## 2015-05-26 MED ORDER — DIPHENHYDRAMINE HCL 50 MG/ML IJ SOLN
INTRAMUSCULAR | Status: AC
  Filled 2015-05-26: qty 1

## 2015-05-26 MED ORDER — SODIUM CHLORIDE 0.9 % IV SOLN
INTRAVENOUS | Status: DC
  Administered 2015-05-26: 08:00:00 via INTRAVENOUS

## 2015-05-26 MED ORDER — MIDAZOLAM HCL 5 MG/ML IJ SOLN
INTRAMUSCULAR | Status: AC
  Filled 2015-05-26: qty 2

## 2015-05-26 MED ORDER — FENTANYL CITRATE (PF) 100 MCG/2ML IJ SOLN
INTRAMUSCULAR | Status: DC | PRN
  Administered 2015-05-26: 25 ug via INTRAVENOUS

## 2015-05-26 MED ORDER — BUTAMBEN-TETRACAINE-BENZOCAINE 2-2-14 % EX AERO
INHALATION_SPRAY | CUTANEOUS | Status: DC | PRN
  Administered 2015-05-26: 2 via TOPICAL

## 2015-05-26 MED ORDER — MIDAZOLAM HCL 10 MG/2ML IJ SOLN
INTRAMUSCULAR | Status: DC | PRN
  Administered 2015-05-26: 2 mg via INTRAVENOUS

## 2015-05-26 NOTE — Progress Notes (Signed)
PROGRESS NOTE  Luis Norton XBM:[id number] DOB: 04-15-1922 DOA: 05/24/2015 PCP: Katy Apo, MD  Brief history 81 year old male with a history of cognitive impairment, atrial fibrillation, hypertension, BPH, peptic ulcer disease hyperlipidemia, presented with 2-3 day history of abdominal pain, nausea, vomiting. The patient was discharged from the hospital on 05/22/2015 in stable condition after repair of inguinal incarcerated hernias performed by Dr. Magnus Ivan. Since admission, the patient has had a bowel movement, but appears that his vomiting has improved. In addition, the patient has been complaining of melanotic stools since discharge from the hospital. Upon admission, the patient was noted to be tachycardic (up to 140s) with low-grade temperature. WBC was 18.5. CT abdomen and pelvis negative for obstruction or free air bu suggested ileus Assessment/Plan: Leukocytosis/tachycardia/acute kidney injury -Continue intravenous fluids -Discontinue antibiotics and observe clinically -Lactic acid 1.5 -Procalcitonin <0.10 -05/24/2015 CT abdomen and pelvis dilated small bowel and sigmoid colon concern with ileus, no free air Acute kidney injury -Secondary to volume depletion -Baseline creatinine is 0.9-1.1 -Continue intravenous fluids-->improved Abdominal Pain/intractable vomiting -Pain seems to be out of proportion with examination--appears to be surgical site/incisional pain on exam -Suspect postoperative pain superimposed upon constipation--CT did suggest large stool burden -Started cathartics-->2BM -general surgery consulted-->not convinced it was ileus--no surgical intervention needed a this time Melanotic stool/ulcerative esophagitis -appreciate GI  05/26/2015 EGD--several distal and mid esophageal ulcers-->ulcerative esophagitis -continue clears per GI at least another 24 hours -restart ASA when okay with GI -continue protonix drip -plan to d/c with PPI bid x one month  then q day indefinitely Paroxysmal Atrial fibrillation - CHA2DS-Vasc score of 3 (age, hypertension) - Rate controlled  - Continue daily aspirin when ok with GI; not on other AC due to risk of falls and melena -05/18/2015 echocardiogram EF 45-50 percent, grade 1 DD Essential hypertension - Continue daily metoprolol succinate   Family Communication: daughter updated at beside 9/6 Disposition Plan: Home 1-2 days     Procedures/Studies: Dg Chest 2 View  05/16/2015   CLINICAL DATA:  Abdominal pain 4 days. Nausea and vomiting with constipation.  EXAM: CHEST  2 VIEW  COMPARISON:  03/18/2013 and 03/10/2013  FINDINGS: Lungs are hyperexpanded with flattening of the hemidiaphragms. There is no focal consolidation or effusion. There is borderline cardiomegaly. Calcified plaque is present over the thoracic aorta. Small nodular cluster over the lateral right midlung unchanged likely calcified granulomas. Remainder of the exam is unchanged.  IMPRESSION: No acute cardiopulmonary disease.  COPD.   Electronically Signed   By: Elberta Fortis M.D.   On: 05/16/2015 16:17   Ct Abdomen Pelvis W Contrast  05/24/2015   CLINICAL DATA:  Abdominal pain, nausea and vomiting. No bowel movement since a bilateral inguinal hernia repair 3 days ago.  EXAM: CT ABDOMEN AND PELVIS WITH CONTRAST  TECHNIQUE: Multidetector CT imaging of the abdomen and pelvis was performed using the standard protocol following bolus administration of intravenous contrast.  CONTRAST:  75mL OMNIPAQUE IOHEXOL 300 MG/ML  SOLN  COMPARISON:  05/16/2015.  FINDINGS: Dilated stomach filled with oral contrast and air with little change in degree of dilatation. Interval dilated, gas-filled loops of bowel in the anterior abdomen. These appear to represent a combination of dilated small bowel and sigmoid colon. The remainder of the colon and small bowel are not dilated. No free peritoneal fluid or air.  Interval repair of the previously seen bilateral inguinal  hernias with associated postoperative air swelling in both inguinal canals. There is also  postoperative air in the anterior abdominal wall at the level of the pelvis bilaterally.  Markedly enlarged, heterogeneous prostate gland is again demonstrated. Anterior bladder wall thickening is also again demonstrated without a focal mass. No hydronephrosis or hydroureter. A large left renal cyst is again demonstrated.  Unremarkable liver, spleen, pancreas, gallbladder and adrenal glands. No enlarged lymph nodes. Atheromatous arterial calcifications. Clear lung bases. Extensive lumbar and lower thoracic spine degenerative changes.  IMPRESSION: 1. Probable postoperative ileus involving central bowel loops. 2. Chronic gastric distention. 3. Status post surgically repaired bilateral inguinal hernias with expected postoperative changes. 4. Stable markedly enlarged and heterogeneous prostate gland. 5. Diffuse anterior bladder wall thickening, most likely due to chronic bladder outlet obstruction by the enlarged prostate gland.   Electronically Signed   By: Beckie Salts M.D.   On: 05/24/2015 15:36   Ct Abdomen Pelvis W Contrast  05/16/2015   CLINICAL DATA:  Severe lower abdominal pain for 4 days, constipation, history hypertension, former smoker, peptic ulcer disease  EXAM: CT ABDOMEN AND PELVIS WITH CONTRAST  TECHNIQUE: Multidetector CT imaging of the abdomen and pelvis was performed using the standard protocol following bolus administration of intravenous contrast. Sagittal and coronal MPR images reconstructed from axial data set.  CONTRAST:  OMNIPAQUE IOHEXOL 300 MG/ML SOLN IV. Dilute oral contrast.  COMPARISON:  09/29/2014  FINDINGS: Lung bases emphysematous but clear.  Large cyst arising from anterior aspect of mid LEFT kidney 7.5 x 5.4 x 7.1 cm.  Liver, spleen, pancreas, kidneys, and adrenal glands otherwise normal.  Stomach distended by contrast and food debris.  Appendix, cecum, and terminal ileum located within a  large RIGHT inguinal hernia.  Numerous small bowel loops within a large LEFT inguinal hernia.  Bowel loops otherwise unremarkable without evidence of bowel obstruction.  Extensive atherosclerotic calcifications.  Marked prostatic enlargement, gland 6.5 x 4.9 x 6.3 cm.  No mass, adenopathy, free air or free fluid.  Osseous demineralization with degenerative disc and facet disease changes of the lumbar spine.  IMPRESSION: Large LEFT renal cyst.  Small bowel loops within large LEFT inguinal hernia without obstruction.  Appendix, cecum and terminal ileum within large RIGHT inguinal hernia without obstruction.  Marked prostatic enlargement.  Extensive atherosclerotic disease.   Electronically Signed   By: Ulyses Southward M.D.   On: 05/16/2015 17:19   Dg Chest Port 1 View  05/24/2015   CLINICAL DATA:  81 year old male with abdominal pain, nausea, vomiting. Dark stools x1 week. Tachycardic. Possible sepsis. Initial encounter.  EXAM: PORTABLE CHEST - 1 VIEW  COMPARISON:  05/16/2015 and earlier.  FINDINGS: Portable AP semi upright view at 1739 hrs. Stable cardiomegaly and mediastinal contours. Chronic calcified aortic atherosclerosis. Stable small calcified granulomas and/or calcified pleural plaques in the right lung. Otherwise, allowing for portable technique, the lungs are clear. No pneumothorax, pneumoperitoneum, or pleural effusion identified. Advanced degenerative changes at the shoulders.  IMPRESSION: No acute cardiopulmonary abnormality.   Electronically Signed   By: Odessa Fleming M.D.   On: 05/24/2015 17:47   Dg Abd 2 Views  05/17/2015   CLINICAL DATA:  Inguinal hernia, mid abdominal pain.  EXAM: ABDOMEN - 2 VIEW  COMPARISON:  CT 05/16/2015  FINDINGS: Oral contrast material seen within the colon. Mild diffuse gaseous distention of bowel. No evidence of bowel obstruction. No free air. No organomegaly.  IMPRESSION: Mild diffuse gaseous distention of bowel, likely mild ileus. No evidence of bowel obstruction or free air.    Electronically Signed   By: Charlett Nose  M.D.   On: 05/17/2015 12:06        Subjective: Patient still complains of intermittent abdominal pain. Denies any fevers, chills, chest discomfort, shortness breath, vomiting, diarrhea, dysuria, hematuria. No hematochezia today. No melena noted. Tolerating clear liquids.   Objective: Filed Vitals:   05/26/15 0915 05/26/15 1000 05/26/15 1221 05/26/15 1640  BP: 131/80 133/76 132/91 97/60  Pulse: 79 75 94 87  Temp:  97.1 F (36.2 C)  98.2 F (36.8 C)  TempSrc:  Oral  Oral  Resp: Height:      Weight:      SpO2: 100% 100%  99%    Intake/Output Summary (Last 24 hours) at 05/26/15 1843 Last data filed at 05/26/15 1800  Gross per 24 hour  Intake 2446.25 ml  Output    204 ml  Net 2242.25 ml   Weight change: 1.697 kg (3 lb 11.9 oz) Exam:   General:  Pt is alert, follows commands appropriately, not in acute distress  HEENT: No icterus, No thrush, No neck mass, [place]/AT  Cardiovascular: RRR, S1/S2, no rubs, no gallops  Respiratory: CTA bilaterally, no wheezing, no crackles, no rhonchi  Abdomen: Soft/+BS, suprapubic tenderness along the surgical site/incision, non distended, no guarding; surgical incision site without erythema, drainage; no hepatosplenomegaly  Extremities: No edema, No lymphangitis, No petechiae, No rashes, no synovitis; no cyanosis or clubbing  Data Reviewed: Basic Metabolic Panel:  Recent Labs Lab 05/21/15 0537 05/24/15 1135 05/24/15 1845 05/25/15 0238 05/26/15 0555  NA 132* 137 137 137 140  K 4.4 5.1 4.7 4.7 4.3  CL 96* 100* 101 102 108  CO2 GLUCOSE 109* 132* 123* 96 75  BUN <5*  CREATININE 1.05 1.34* 1.03 0.86 0.89  CALCIUM 8.4* 9.3 8.8* 8.4* 8.6*   Liver Function Tests:  Recent Labs Lab 05/24/15 1135 05/24/15 1845 05/26/15 0555  AST 36 23 21  ALT ALKPHOS 106 91 79  BILITOT 0.6 0.7 0.6  PROT 7.2 5.9* 5.6*  ALBUMIN 3.1* 2.6* 2.3*   No results  for input(s): LIPASE, AMYLASE in the last 168 hours. No results for input(s): AMMONIA in the last 168 hours. CBC:  Recent Labs Lab 05/21/15 0537 05/24/15 1135 05/24/15 1845 05/25/15 0238 05/26/15 0555  WBC 9.8 18.5* 17.2* 14.3* 8.4  NEUTROABS  --   --  14.5*  --   --   HGB 10.5* 11.6* 10.5* 8.9* 10.0*  HCT 31.7* 34.9* 32.0* 26.9* 30.1*  MCV 83.9 83.5 82.1 83.0 82.2  PLT 355 449* 434* 431* 413*   Cardiac Enzymes: No results for input(s): CKTOTAL, CKMB, CKMBINDEX, TROPONINI in the last 168 hours. BNP: Invalid input(s): POCBNP CBG: No results for input(s): GLUCAP in the last 168 hours.  Recent Results (from the past 240 hour(s))  Culture, blood (x 2)     Status: None (Preliminary result)   Collection Time: 05/24/15  6:45 PM  Result Value Ref Range Status   Specimen Description BLOOD LEFT ANTECUBITAL  Final   Special Requests IN PEDIATRIC BOTTLE 4CC  Final   Culture NO GROWTH 2 DAYS  Final   Report Status PENDING  Incomplete  Culture, blood (x 2)     Status: None (Preliminary result)   Collection Time: 05/24/15  6:56 PM  Result Value Ref Range Status   Specimen Description BLOOD LEFT HAND  Final   Special Requests IN PEDIATRIC BOTTLE [facility]  Final   Culture NO  GROWTH 2 DAYS  Final   Report Status PENDING  Incomplete  MRSA PCR Screening     Status: None   Collection Time: 05/24/15  7:11 PM  Result Value Ref Range Status   MRSA by PCR NEGATIVE NEGATIVE Final    Comment:        The GeneXpert MRSA Assay (FDA approved for NASAL specimens only), is one component of a comprehensive MRSA colonization surveillance program. It is not intended to diagnose MRSA infection nor to guide or monitor treatment for MRSA infections.      Scheduled Meds: . antiseptic oral rinse  7 mL Mouth Rinse q12n4p  . chlorhexidine  15 mL Mouth Rinse BID  . metoprolol succinate  25 mg Oral Daily  . [START ON 05/28/2015] pantoprazole (PROTONIX) IV  40 mg Intravenous Q12H  . polyethylene glycol  17  g Oral Daily  . sodium chloride  3 mL Intravenous Q12H   Continuous Infusions: . sodium chloride 75 mL/hr at 05/26/15 1744  . pantoprozole (PROTONIX) infusion 8 mg/hr (05/26/15 1159)     Taylen Wendland, DO  [facility] Pager [phone]  If 7PM-7AM, please contact night-coverage [email or web address] Password [email or web address] 05/26/2015, 6:43 PM   LOS: 2 days

## 2015-05-26 NOTE — Progress Notes (Signed)
Charlies Constable 8:34 AM  Subjective: Patient not quite as alert this morning as yesterday but no new complaints and still has general abdominal pain  Objective: Vital signs stable afebrile no acute distress exam please see pre-assessment labs okay abdomen with minimal guarding but soft no rebound tender throughout  Assessment: Abdominal pain questionable etiology with GI bleeding  Plan: Okay to proceed with endoscopy today with regular sedation  [facility]  Pager [phone] After 5PM or if no answer call [phone]

## 2015-05-26 NOTE — Op Note (Signed)
[facility] [address] [place], [address]   ENDOSCOPY PROCEDURE REPORT  PATIENT: Luis Norton, Luis Norton  MR#: [id number] BIRTHDATE: 1922/05/21 , 89  yrs. old GENDER: male ENDOSCOPIST: Vida Rigger, MD REFERRED BY: PROCEDURE DATE:  06-24-15 PROCEDURE:  EGD w/ biopsy ASA CLASS:     Class III INDICATIONS:  abdominal pain, abnormal CT of the GI tract, and melena. MEDICATIONS: Fentanyl 25 mcg IV and Versed 2 mg IV TOPICAL ANESTHETIC: Cetacaine Spray  DESCRIPTION OF PROCEDURE: After the risks benefits and alternatives of the procedure were thoroughly explained, informed consent was obtained.  The Pentax Gastroscope [id number] endoscope was introduced through the mouth and advanced to the third portion of the duodenum , Without limitations.  The instrument was slowly withdrawn as the mucosa was fully examined. Estimated blood loss is zero unless otherwise noted in this procedure report.    The findings are recorded below       Retroflexed views revealed a hiatal hernia.     The scope was then withdrawn from the patient and the procedure completed.  COMPLICATIONS: There were no immediate complications.  ENDOSCOPIC IMPRESSION: 1. Small hiatal hernia 2. Severe distal and mid ulcerative esophagitis 3. Seemingly extrinsic compression mid body of the stomach 4. Mild antritis status post biopsy and biopsy of proximal stomach to rule out H. pylori 5. Significant deep pyloric channel ulcer with minimal oozing with passing the scope but stopped spontaneously without at risk lesion 6. Otherwise normal. The second and third part of the duodenum and proximal esophagus   RECOMMENDATIONS: twice a day pump inhibitors for 1 month and then if doing well can decrease to once a day continue clear liquids for a day or 2 hold aspirin and non-steroidal's long-term continue MiraLAX for his ileus and constipation okay with me to use subcutaneous Lovenox if needed and will follow  with youand consider repeat endoscopy in 2-3 months to document healing  REPEAT EXAM: as needed or as above  eSigned:  Vida Rigger, MD 24-Jun-2015 8:55 AM    CC:  CPT CODES: ICD CODES:  The ICD and CPT codes recommended by this software are interpretations from the data that the clinical staff has captured with the software.  The verification of the translation of this report to the ICD and CPT codes and modifiers is the sole responsibility of the health care institution and practicing physician where this report was generated.  [employer] will not be held responsible for the validity of the ICD and CPT codes included on this report.  AMA assumes no liability for data contained or not contained herein. CPT is a Publishing rights manager of the [employer].  PATIENT NAME:  Luis Norton, Luis Norton MR#: [id number]

## 2015-05-27 ENCOUNTER — Encounter (HOSPITAL_COMMUNITY): Payer: Self-pay | Admitting: Gastroenterology

## 2015-05-27 DIAGNOSIS — I10 Essential (primary) hypertension: Secondary | ICD-10-CM

## 2015-05-27 DIAGNOSIS — K913 Postprocedural intestinal obstruction: Secondary | ICD-10-CM

## 2015-05-27 DIAGNOSIS — N179 Acute kidney failure, unspecified: Principal | ICD-10-CM

## 2015-05-27 DIAGNOSIS — I4891 Unspecified atrial fibrillation: Secondary | ICD-10-CM

## 2015-05-27 DIAGNOSIS — R1010 Upper abdominal pain, unspecified: Secondary | ICD-10-CM

## 2015-05-27 LAB — BASIC METABOLIC PANEL
Anion gap: 5 (ref 5–15)
BUN: 5 mg/dL — AB (ref 6–20)
CALCIUM: 8.2 mg/dL — AB (ref 8.9–10.3)
CO2: 27 mmol/L (ref 22–32)
CREATININE: 0.98 mg/dL (ref 0.61–1.24)
Chloride: 103 mmol/L (ref 101–111)
GFR calc non Af Amer: >60 mL/min (ref >60)
GLUCOSE: 84 mg/dL (ref 65–99)
Potassium: 4.1 mmol/L (ref 3.5–5.1)
Sodium: 135 mmol/L (ref 135–145)

## 2015-05-27 LAB — CBC
HCT: 27.6 % — ABNORMAL LOW (ref 39.0–52.0)
Hemoglobin: 9 g/dL — ABNORMAL LOW (ref 13.0–17.0)
MCH: 26.9 pg (ref 26.0–34.0)
MCHC: 32.6 g/dL (ref 30.0–36.0)
MCV: 82.6 fL (ref 78.0–100.0)
PLATELETS: 419 10*3/uL — AB (ref 150–400)
RBC: 3.34 MIL/uL — ABNORMAL LOW (ref 4.22–5.81)
RDW: 13.4 % (ref 11.5–15.5)
WBC: 7.5 10*3/uL (ref 4.0–10.5)

## 2015-05-27 NOTE — Progress Notes (Addendum)
Charlies Constable 12:45 PM  Subjective: Patient doing much better and we discussed his endoscopic findings with him and his family and is walking the hallways and has no other complaints and his pain is better  Objective: Vital signs stable afebrile no acute distress abdomen is soft nontender labs stable  Assessment: Significant pyloric channel ulcer biopsy positive for H. pylori  Plan: Twice a day pump inhibitors for one month and then decrease to once a day long-term and avoid aspirin and non-steroidal long-term and Tylenol only as an outpatient which I discussed with the patient and his family and I be happy to see back as needed and he should be treated for H. pylori probably in 2 weeks as an outpatient and I'm happy to see in the office to set that up or have his primary doctor do that on follow-up  [facility] E  Pager [phone] After 5PM or if no answer call [phone]

## 2015-05-27 NOTE — Evaluation (Signed)
Physical Therapy Evaluation Patient Details Name: Luis Norton MRN: [id number] DOB: Apr 10, 1922 Today's Date: 05/27/2015   History of Present Illness  Pt is a 81 y.o. male with a past medical history of cognitive impairment, history of atrial fibrillation, hypertension, dyslipidemia, peptic ulcer disease who was recently admitted to the medicine service on 05/16/2015 and discharged on 05/22/2015 hospitalized at the time for incarcerated bilateral inguinal hernias. He underwent bilateral inguinal hernia repair on 05/20/2015. He was discharged in stable condition. He presented to the ED 05-24-15 with N&V and abdominal pain. CT revealed a postoperative ileus.  Clinical Impression  Pt admitted with above diagnosis. Pt currently with functional limitations due to the deficits listed below (see PT Problem List). On eval, pt needed min guard assist for transfers and gait with RW 250 feet. He is very motivated to participate in therapy. Pt will benefit from skilled PT to increase their independence and safety with mobility to allow discharge to the venue listed below.  If family is unable to provide 24-hour assist, SNF may be indicated.     Follow Up Recommendations Home health PT;Supervision/Assistance - 24 hour    Equipment Recommendations  None recommended by PT    Recommendations for Other Services       Precautions / Restrictions Precautions Precautions: Fall Restrictions Weight Bearing Restrictions: No      Mobility  Bed Mobility               General bed mobility comments: Pt in recliner upon arrival.  Transfers   Equipment used: Rolling walker (2 wheeled)   Sit to Stand: Min guard Stand pivot transfers: Min guard       General transfer comment: increased time to complete  Ambulation/Gait Ambulation/Gait assistance: Min guard Ambulation Distance (Feet): 250 Feet Assistive device: Rolling walker (2 wheeled) Gait Pattern/deviations: Step-through pattern;Decreased stride  length Gait velocity: WFL   General Gait Details: verbal cues for RW management, safety  Stairs            Wheelchair Mobility    Modified Rankin (Stroke Patients Only)       Balance                                             Pertinent Vitals/Pain Pain Assessment: No/denies pain    Home Living Family/patient expects to be discharged to:: Private residence Living Arrangements: Other relatives Available Help at Discharge: Available 24 hours/day Type of Home: Apartment       Home Layout: One level Home Equipment: Environmental consultant - 2 wheels Additional Comments: reports neice owns business of taking care of elderly out of her home and she takes care of him    Prior Function Level of Independence: Needs assistance   Gait / Transfers Assistance Needed: Mod I with mobility  ADL's / Homemaking Assistance Needed: reports neice helps with bathing and has a seat in shower        Hand Dominance   Dominant Hand: Right    Extremity/Trunk Assessment   Upper Extremity Assessment: Overall WFL for tasks assessed           Lower Extremity Assessment: Overall WFL for tasks assessed      Cervical / Trunk Assessment: Normal  Communication   Communication: No difficulties  Cognition Arousal/Alertness: Awake/alert Behavior During Therapy: WFL for tasks assessed/performed Overall Cognitive Status: History of cognitive impairments - at baseline  Memory: Decreased short-term memory              General Comments      Exercises        Assessment/Plan    PT Assessment Patient needs continued PT services  PT Diagnosis Difficulty walking;Generalized weakness   PT Problem List Decreased activity tolerance;Decreased balance;Decreased mobility;Decreased knowledge of precautions;Decreased safety awareness  PT Treatment Interventions DME instruction;Gait training;Functional mobility training;Therapeutic activities;Therapeutic  exercise;Patient/family education;Balance training   PT Goals (Current goals can be found in the Care Plan section) Acute Rehab PT Goals Patient Stated Goal: home PT Goal Formulation: With patient Time For Goal Achievement: 06/10/15 Potential to Achieve Goals: Good    Frequency Min 3X/week   Barriers to discharge        Co-evaluation               End of Session Equipment Utilized During Treatment: Gait belt Activity Tolerance: Patient tolerated treatment well Patient left: in chair;with call bell/phone within reach Nurse Communication: Mobility status         Time: 1610-9604 PT Time Calculation (min) (ACUTE ONLY): 14 min   Charges:   PT Evaluation $Initial PT Evaluation Tier I: 1 Procedure     PT G CodesIlda Foil 05/27/2015, 10:43 AM

## 2015-05-27 NOTE — Progress Notes (Signed)
PROGRESS NOTE  Luis Norton ZOX:[id number] DOB: 04-23-1922 DOA: 05/24/2015 PCP: Katy Apo, MD    Brief history  81 year old male with a history of cognitive impairment, atrial fibrillation, hypertension, BPH, peptic ulcer disease hyperlipidemia, presented with 2-3 day history of abdominal pain, nausea, vomiting. The patient was discharged from the hospital on 05/22/2015 in stable condition after repair of inguinal incarcerated hernias performed by Dr. Magnus Ivan. Since admission, the patient has had a bowel movement, but appears that his vomiting has improved. In addition, the patient has been complaining of melanotic stools since discharge from the hospital. Upon admission, the patient was noted to be tachycardic (up to 140s) with low-grade temperature. WBC was 18.5. CT abdomen and pelvis negative for obstruction or free air bu suggested ileus.  Subjective:  Patient in bed, denies any chest or abdominal pain, no shortness of breath or focal weakness. No nausea today.   Assessment/Plan:  Leukocytosis/tachycardia/acute kidney injury -Likely due to abdominal pain and ulcerative esophagitis. Afebrile, chest x-ray stable without any evidence of pneumonia. Has been off of anti-biotics with improved and stabilized WBC count. His lactic acid was 1.5 and pro calcitonin was less than 0.10. CT abdomen pelvis was nonacute as well. Continue treatment for GI problems as below.   Acute kidney injury -Secondary to volume depletion, baseline creatinine of around 1.1. With IV fluids has stabilized.    Abdominal Pain/intractable vomiting due to stool burden and ulcerative esophagitis -Pain seems to be out of proportion with examination--, had recent hernia repair, cleared by general surgery. CT abdomen and pelvis adjustable stool burden. Was started on bowel regimen with good results. - GI following. EGD on 05/26/2015 shows severe distal and mid esophageal ulcers with esophagitis. Hold  aspirin, continue PPI drip, bowel rest. Much improved continue to monitor. Had melanotic stools likely mild upper GI bleed but H&H remained stable.   Paroxysmal Atrial fibrillation - CHA2DS-Vasc score of 3 (age, hypertension), he is rate controlled, was on aspirin without any anticoagulation. Due to above problem of esophagitis and melanotic stool. Aspirin on hold. On PPI. Will resume aspirin likely after total of 7 day break. - Not on anticoagulation due to fall risk from advanced age -08/18/2015 echocardiogram EF 45-50 percent, grade 1 DD  Essential hypertension - Continue daily metoprolol succinate   Family Communication: daughter updated at beside 9/6 by previous M.D. Disposition Plan: Home 1-2 days     Procedures/Studies: Dg Chest 2 View  05/16/2015   CLINICAL DATA:  Abdominal pain 4 days. Nausea and vomiting with constipation.  EXAM: CHEST  2 VIEW  COMPARISON:  03/18/2013 and 03/10/2013  FINDINGS: Lungs are hyperexpanded with flattening of the hemidiaphragms. There is no focal consolidation or effusion. There is borderline cardiomegaly. Calcified plaque is present over the thoracic aorta. Small nodular cluster over the lateral right midlung unchanged likely calcified granulomas. Remainder of the exam is unchanged.  IMPRESSION: No acute cardiopulmonary disease.  COPD.   Electronically Signed   By: Elberta Fortis M.D.   On: 05/16/2015 16:17   Ct Abdomen Pelvis W Contrast  05/24/2015   CLINICAL DATA:  Abdominal pain, nausea and vomiting. No bowel movement since a bilateral inguinal hernia repair 3 days ago.  EXAM: CT ABDOMEN AND PELVIS WITH CONTRAST  TECHNIQUE: Multidetector CT imaging of the abdomen and pelvis was performed using the standard protocol following bolus administration of intravenous contrast.  CONTRAST:  75mL OMNIPAQUE IOHEXOL 300 MG/ML  SOLN  COMPARISON:  05/16/2015.  FINDINGS: Dilated stomach filled with oral contrast and air with little change in degree of dilatation.  Interval dilated, gas-filled loops of bowel in the anterior abdomen. These appear to represent a combination of dilated small bowel and sigmoid colon. The remainder of the colon and small bowel are not dilated. No free peritoneal fluid or air.  Interval repair of the previously seen bilateral inguinal hernias with associated postoperative air swelling in both inguinal canals. There is also postoperative air in the anterior abdominal wall at the level of the pelvis bilaterally.  Markedly enlarged, heterogeneous prostate gland is again demonstrated. Anterior bladder wall thickening is also again demonstrated without a focal mass. No hydronephrosis or hydroureter. A large left renal cyst is again demonstrated.  Unremarkable liver, spleen, pancreas, gallbladder and adrenal glands. No enlarged lymph nodes. Atheromatous arterial calcifications. Clear lung bases. Extensive lumbar and lower thoracic spine degenerative changes.  IMPRESSION: 1. Probable postoperative ileus involving central bowel loops. 2. Chronic gastric distention. 3. Status post surgically repaired bilateral inguinal hernias with expected postoperative changes. 4. Stable markedly enlarged and heterogeneous prostate gland. 5. Diffuse anterior bladder wall thickening, most likely due to chronic bladder outlet obstruction by the enlarged prostate gland.   Electronically Signed   By: Beckie Salts M.D.   On: 05/24/2015 15:36   Ct Abdomen Pelvis W Contrast  05/16/2015   CLINICAL DATA:  Severe lower abdominal pain for 4 days, constipation, history hypertension, former smoker, peptic ulcer disease  EXAM: CT ABDOMEN AND PELVIS WITH CONTRAST  TECHNIQUE: Multidetector CT imaging of the abdomen and pelvis was performed using the standard protocol following bolus administration of intravenous contrast. Sagittal and coronal MPR images reconstructed from axial data set.  CONTRAST:  OMNIPAQUE IOHEXOL 300 MG/ML SOLN IV. Dilute oral contrast.  COMPARISON:   09/29/2014  FINDINGS: Lung bases emphysematous but clear.  Large cyst arising from anterior aspect of mid LEFT kidney 7.5 x 5.4 x 7.1 cm.  Liver, spleen, pancreas, kidneys, and adrenal glands otherwise normal.  Stomach distended by contrast and food debris.  Appendix, cecum, and terminal ileum located within a large RIGHT inguinal hernia.  Numerous small bowel loops within a large LEFT inguinal hernia.  Bowel loops otherwise unremarkable without evidence of bowel obstruction.  Extensive atherosclerotic calcifications.  Marked prostatic enlargement, gland 6.5 x 4.9 x 6.3 cm.  No mass, adenopathy, free air or free fluid.  Osseous demineralization with degenerative disc and facet disease changes of the lumbar spine.  IMPRESSION: Large LEFT renal cyst.  Small bowel loops within large LEFT inguinal hernia without obstruction.  Appendix, cecum and terminal ileum within large RIGHT inguinal hernia without obstruction.  Marked prostatic enlargement.  Extensive atherosclerotic disease.   Electronically Signed   By: Ulyses Southward M.D.   On: 05/16/2015 17:19   Dg Chest Port 1 View  05/24/2015   CLINICAL DATA:  81 year old male with abdominal pain, nausea, vomiting. Dark stools x1 week. Tachycardic. Possible sepsis. Initial encounter.  EXAM: PORTABLE CHEST - 1 VIEW  COMPARISON:  05/16/2015 and earlier.  FINDINGS: Portable AP semi upright view at 1739 hrs. Stable cardiomegaly and mediastinal contours. Chronic calcified aortic atherosclerosis. Stable small calcified granulomas and/or calcified pleural plaques in the right lung. Otherwise, allowing for portable technique, the lungs are clear. No pneumothorax, pneumoperitoneum, or pleural effusion identified. Advanced degenerative changes at the shoulders.  IMPRESSION: No acute cardiopulmonary abnormality.   Electronically Signed   By: Odessa Fleming M.D.   On: 05/24/2015 17:47   Dg Abd 2  Views  05/17/2015   CLINICAL DATA:  Inguinal hernia, mid abdominal pain.  EXAM: ABDOMEN - 2 VIEW   COMPARISON:  CT 05/16/2015  FINDINGS: Oral contrast material seen within the colon. Mild diffuse gaseous distention of bowel. No evidence of bowel obstruction. No free air. No organomegaly.  IMPRESSION: Mild diffuse gaseous distention of bowel, likely mild ileus. No evidence of bowel obstruction or free air.   Electronically Signed   By: Charlett Nose M.D.   On: 05/17/2015 12:06        Objective: Filed Vitals:   05/26/15 1640 05/26/15 2047 05/27/15 0417 05/27/15 1000  BP: 97/60 111/48 120/45 111/45  Pulse: 87 62 66 71  Temp: 98.2 F (36.8 C) 98 F (36.7 C) 99.1 F (37.3 C) 98.5 F (36.9 C)  TempSrc: Oral Oral Oral Oral  Resp: 17 18 18 18   Height:      Weight:      SpO2: 99% 100% 98% 100%    Intake/Output Summary (Last 24 hours) at 05/27/15 1053 Last data filed at 05/27/15 0900  Gross per 24 hour  Intake   3630 ml  Output      4 ml  Net   3626 ml   Weight change:  Exam:   General:  Pt is alert, follows commands appropriately, not in acute distress  HEENT: No icterus, No thrush, No neck mass, [place]/AT  Cardiovascular: RRR, S1/S2, no rubs, no gallops  Respiratory: CTA bilaterally, no wheezing, no crackles, no rhonchi  Abdomen: Soft/+BS, suprapubic tenderness along the surgical site/incision, non distended, no guarding; surgical incision site without erythema, drainage; no hepatosplenomegaly  Extremities: No edema, No lymphangitis, No petechiae, No rashes, no synovitis; no cyanosis or clubbing  Data Reviewed: Basic Metabolic Panel:  Recent Labs Lab 05/24/15 1135 05/24/15 1845 05/25/15 0238 05/26/15 0555 05/27/15 0525  NA 137 137 137 140 135  K 5.1 4.7 4.7 4.3 4.1  CL 100* 101 102 108 103  CO2 28 29 27 24 27   GLUCOSE 132* 123* 96 75 84  BUN 16 14 12  <5* 5*  CREATININE 1.34* 1.03 0.86 0.89 0.98  CALCIUM 9.3 8.8* 8.4* 8.6* 8.2*   Liver Function Tests:  Recent Labs Lab 05/24/15 1135 05/24/15 1845 05/26/15 0555  AST 36 23 21  ALT 24 20 18   ALKPHOS 106 91  79  BILITOT 0.6 0.7 0.6  PROT 7.2 5.9* 5.6*  ALBUMIN 3.1* 2.6* 2.3*   No results for input(s): LIPASE, AMYLASE in the last 168 hours. No results for input(s): AMMONIA in the last 168 hours. CBC:  Recent Labs Lab 05/24/15 1135 05/24/15 1845 05/25/15 0238 05/26/15 0555 05/27/15 0525  WBC 18.5* 17.2* 14.3* 8.4 7.5  NEUTROABS  --  14.5*  --   --   --   HGB 11.6* 10.5* 8.9* 10.0* 9.0*  HCT 34.9* 32.0* 26.9* 30.1* 27.6*  MCV 83.5 82.1 83.0 82.2 82.6  PLT 449* 434* 431* 413* 419*   Cardiac Enzymes: No results for input(s): CKTOTAL, CKMB, CKMBINDEX, TROPONINI in the last 168 hours. BNP: Invalid input(s): POCBNP CBG: No results for input(s): GLUCAP in the last 168 hours.  Recent Results (from the past 240 hour(s))  Culture, blood (x 2)     Status: None (Preliminary result)   Collection Time: 05/24/15  6:45 PM  Result Value Ref Range Status   Specimen Description BLOOD LEFT ANTECUBITAL  Final   Special Requests IN PEDIATRIC BOTTLE 4CC  Final   Culture NO GROWTH 2 DAYS  Final  Report Status PENDING  Incomplete  Culture, blood (x 2)     Status: None (Preliminary result)   Collection Time: 05/24/15  6:56 PM  Result Value Ref Range Status   Specimen Description BLOOD LEFT HAND  Final   Special Requests IN PEDIATRIC BOTTLE 2CC  Final   Culture NO GROWTH 2 DAYS  Final   Report Status PENDING  Incomplete  MRSA PCR Screening     Status: None   Collection Time: 05/24/15  7:11 PM  Result Value Ref Range Status   MRSA by PCR NEGATIVE NEGATIVE Final    Comment:        The GeneXpert MRSA Assay (FDA approved for NASAL specimens only), is one component of a comprehensive MRSA colonization surveillance program. It is not intended to diagnose MRSA infection nor to guide or monitor treatment for MRSA infections.      Scheduled Meds: . antiseptic oral rinse  7 mL Mouth Rinse q12n4p  . chlorhexidine  15 mL Mouth Rinse BID  . metoprolol succinate  25 mg Oral Daily  . [START ON  05/28/2015] pantoprazole (PROTONIX) IV  40 mg Intravenous Q12H  . polyethylene glycol  17 g Oral Daily  . sodium chloride  3 mL Intravenous Q12H   Continuous Infusions: . sodium chloride 75 mL/hr (05/27/15 0716)  . pantoprozole (PROTONIX) infusion 8 mg/hr (05/27/15 0716)     Leroy Sea, MD  [facility] Pager [phone]  If 7PM-7AM, please contact night-coverage [email or web address] Password [email or web address] 05/27/2015, 10:53 AM   LOS: 3 days

## 2015-05-27 NOTE — Clinical Documentation Improvement (Signed)
Hospitalist  Based on the clinical findings below, please document if the patient had this condition during hospital course. Thank you   Pneumonia ruled in  Pneumonia ruled out  Other  Clinically Undetermined  Supporting Information: "Will check a portable chest x-ray to assess for possible HCAP. Will initiate broad-spectrum IV antimicrobial therapy with Vancomycin and Fortaz"   Please exercise your independent, professional judgment when responding. A specific answer is not anticipated or expected.   Thank [facility] [place] [phone]

## 2015-05-28 MED ORDER — PANTOPRAZOLE SODIUM 40 MG PO TBEC: 40.0000 mg | DELAYED_RELEASE_TABLET | Freq: Two times a day (BID) | ORAL | Status: DC

## 2015-05-28 MED ORDER — POLYETHYLENE GLYCOL 3350 17 GM/SCOOP PO POWD: 1.0000 | Freq: Every day | ORAL | Status: DC | PRN

## 2015-05-28 NOTE — Care Management Note (Signed)
Case Management Note  Patient Details  Name: Creedence Kunesh MRN: [id number] Date of Birth: 06-Jun-1922  Subjective/Objective:           CM following for progression and d/c planning.         Action/Plan: Met with pt and family, HHRN, PT and aide services ordered, pt wishes to resume services from [facility] notified.   Expected Discharge Date:        05/28/2015          Expected Discharge Plan:  [place]  In-House Referral:  NA  Discharge planning Services  CM Consult  Post Acute Care Choice:  Home Health Choice offered to:  Patient  DME Arranged:  N/A DME Agency:  NA  HH Arranged:  RN, PT, Nurse's Aide [place] Agency:  [facility]  Status of Service:  Completed, signed off  Medicare Important Message Given:    Date Medicare IM Given:    Medicare IM give by:    Date Additional Medicare IM Given:    Additional Medicare Important Message give by:     If discussed at [place] of Stay Meetings, dates discussed:    Additional Comments:  Adron Bene, RN 05/28/2015, 10:23 AM

## 2015-05-28 NOTE — Discharge Summary (Signed)
Luis Norton, is a 81 y.o. male  DOB 1921/12/06  MRN [id number].  Admission date:  05/24/2015  Admitting Physician  Jeralyn Bennett, MD  Discharge Date:  05/28/2015   Primary MD  Katy Apo, MD  Recommendations for primary care physician for things to follow:   Monitor CBC, BMP closely   Admission Diagnosis  Sepsis [A41.9]   Discharge Diagnosis  Sepsis [A41.9]    Active Problems:   Atrial fibrillation   Atrial fibrillation with RVR   Essential hypertension   PUD (peptic ulcer disease)   Sepsis   Ileus, postoperative   Upper GI bleed   Melena   AKI (acute kidney injury)   Leukocytosis   DNR (do not resuscitate) discussion   Palliative care encounter   Abdominal pain   Weakness generalized      Past Medical History  Diagnosis Date  . Hypertension   . Hypercholesterolemia   . ED (erectile dysfunction)   . Urinary frequency     bladder instablity and outlet obstruction   . Constipation   . PUD (peptic ulcer disease)     history of   . BPH (benign prostatic hyperplasia)   . Atrial fibrillation     Past Surgical History  Procedure Laterality Date  . Ankle surgery Right   . Inguinal hernia repair Bilateral 05/20/2015    Procedure: HERNIA REPAIR INGUINAL ADULT BILATERAL;  Surgeon: Abigail Miyamoto, MD;  Location: [facility] OR;  Service: General;  Laterality: Bilateral;  . Insertion of mesh Bilateral 05/20/2015    Procedure: INSERTION OF MESH;  Surgeon: Abigail Miyamoto, MD;  Location: [facility] OR;  Service: General;  Laterality: Bilateral;  . Esophagogastroduodenoscopy N/A 05/26/2015    Procedure: ESOPHAGOGASTRODUODENOSCOPY (EGD);  Surgeon: Vida Rigger, MD;  Location: [facility] ENDOSCOPY;  Service: Endoscopy;  Laterality: N/A;       HPI  from the history and physical done on the day of admission:    81 year old  male with a history of cognitive impairment, atrial fibrillation, hypertension, BPH, peptic ulcer disease hyperlipidemia, presented with 2-3 day history of abdominal pain, nausea, vomiting. The patient was discharged from the hospital on 05/22/2015 in stable condition after repair of inguinal incarcerated hernias performed by Dr. Magnus Ivan. Since admission, the patient has had a bowel movement, but appears that his vomiting has improved. In addition, the patient has been complaining of melanotic stools since discharge from the hospital. Upon admission, the patient was noted to be tachycardic (up to 140s) with low-grade temperature. WBC was 18.5. CT abdomen and pelvis negative for obstruction or free air bu suggested ileus.     Hospital Course:     Leukocytosis/tachycardia/acute kidney injury -Likely due to abdominal pain and ulcerative esophagitis. Afebrile, chest x-ray stable without any evidence of pneumonia. Has been off of anti-biotics with improved and stabilized WBC count. His lactic acid was 1.5 and pro calcitonin was less than 0.10. CT abdomen pelvis was nonacute as well. Continue treatment for GI problems as below.   Acute kidney injury -Secondary to volume depletion, baseline creatinine  of around 1.1. With IV fluids has resolved.   Abdominal Pain/intractable vomiting due to stool burden and ulcerative esophagitis -Pain seems to be out of proportion with examination--, had recent hernia repair, cleared by general surgery. CT abdomen and pelvis adjustable stool burden. Was started on bowel regimen with good results. Will be discharged on as needed oral medical lax.  - Was seen by GI he underwent EGD on 05/26/2015 shows severe distal and mid esophageal ulcers with esophagitis. Hold aspirin till follows with GI and to be started by GI physician as appropriate, per limb tests positive for H pylori. Per GI physician Dr. Ewing Schlein to be discharged on twice a day oral PPI with outpatient follow-up  with GI for definitive H. pylori treatment in the next week.  He is clinically Much improved and now completely symptom-free. Had melanotic stools likely mild upper GI bleed but H&H remained stable. Request PCP to check CBC and BMP next visit.   Paroxysmal Atrial fibrillation - CHA2DS-Vasc score of 3 (age, hypertension), he is rate controlled, was on aspirin without any anticoagulation. Due to above problem of esophagitis and melanotic stool. Aspirin on hold until cleared by GI to resume. On PPI. Will resume aspirin likely after total of 7 day break. - Not on anticoagulation due to fall risk from advanced age, 05/18/2015 echocardiogram EF 45-50 percent, grade 1 DD  Essential hypertension - Continue daily metoprolol succinate        Discharge Condition: Stable  Follow UP  Follow-up Information    Follow up with POLITE,RONALD D, MD. Schedule an appointment as soon as possible for a visit in 1 week.   Specialty:  Internal Medicine   Contact information:   [address] [place] [address] [phone]       Follow up with [facility] E, MD. Schedule an appointment as soon as possible for a visit in 1 week.   Specialty:  Gastroenterology   Why:  H Pylori   Contact information:   [address] [place] [address] [phone]        Consults obtained -  GI  Diet and Activity recommendation: See Discharge Instructions below  Discharge Instructions           Discharge Instructions    Diet - low sodium heart healthy    Complete by:  As directed      Discharge instructions    Complete by:  As directed   Follow with Primary MD Katy Apo, MD in 7 days   Get CBC, CMP, 2 view Chest X ray checked  by Primary MD next visit.    Activity: As tolerated with Full fall precautions use walker/cane & assistance as needed   Disposition Home     Diet: Heart Healthy    For Heart failure patients - Check your Weight same time everyday, if  you gain over 2 pounds, or you develop in leg swelling, experience more shortness of breath or chest pain, call your Primary MD immediately. Follow Cardiac Low Salt Diet and 1.5 lit/day fluid restriction.   On your next visit with your primary care physician please Get Medicines reviewed and adjusted.   Please request your Prim.MD to go over all Hospital Tests and Procedure/Radiological results at the follow up, please get all Hospital records sent to your Prim MD by signing hospital release before you go home.   If you experience worsening of your admission symptoms, develop shortness of breath, life threatening emergency, suicidal  or homicidal thoughts you must seek medical attention immediately by calling 911 or calling your MD immediately  if symptoms less severe.  You Must read complete instructions/literature along with all the possible adverse reactions/side effects for all the Medicines you take and that have been prescribed to you. Take any new Medicines after you have completely understood and accpet all the possible adverse reactions/side effects.   Do not drive, operating heavy machinery, perform activities at heights, swimming or participation in water activities or provide baby sitting services if your were admitted for syncope or siezures until you have seen by Primary MD or a Neurologist and advised to do so again.  Do not drive when taking Pain medications.    Do not take more than prescribed Pain, Sleep and Anxiety Medications  Special Instructions: If you have smoked or chewed Tobacco  in the last 2 yrs please stop smoking, stop any regular Alcohol  and or any Recreational drug use.  Wear Seat belts while driving.   Please note  You were cared for by a hospitalist during your hospital stay. If you have any questions about your discharge medications or the care you received while you were in the hospital after you are discharged, you can call the unit and asked to speak  with the hospitalist on call if the hospitalist that took care of you is not available. Once you are discharged, your primary care physician will handle any further medical issues. Please note that NO REFILLS for any discharge medications will be authorized once you are discharged, as it is imperative that you return to your primary care physician (or establish a relationship with a primary care physician if you do not have one) for your aftercare needs so that they can reassess your need for medications and monitor your lab values.     Increase activity slowly    Complete by:  As directed              Discharge Medications       Medication List    STOP taking these medications        aspirin 81 MG EC tablet      TAKE these medications        bisacodyl 5 MG EC tablet  Commonly known as:  DULCOLAX  1 tab q hs Prn constipation     metoprolol succinate 25 MG 24 hr tablet  Commonly known as:  TOPROL-XL  Take 1 tablet (25 mg total) by mouth daily.     pantoprazole 40 MG tablet  Commonly known as:  PROTONIX  Take 1 tablet (40 mg total) by mouth 2 (two) times daily. Switch for any other PPI at similar dose and frequency     polyethylene glycol powder powder  Commonly known as:  GLYCOLAX/MIRALAX  Take 255 g by mouth daily as needed.     traMADol 50 MG tablet  Commonly known as:  ULTRAM  Take 0.5-1 tablets (25-50 mg total) by mouth every 6 (six) hours as needed for severe pain.        Major procedures and Radiology Reports - PLEASE review detailed and final reports for all details, in brief -    EGD 1. Small hiatal hernia 2. Severe distal and mid ulcerative esophagitis 3. Seemingly extrinsic compression mid body of the stomach 4. Mild antritis status post biopsy and biopsy of proximal stomach to rule out H. pylori 5. Significant deep pyloric channel ulcer with minimal oozing with passing the scope but  stopped spontaneously without at risk lesion 6. Otherwise normal. The second  and third part of the duodenum and proximal esophagus   RECOMMENDATIONS: twice a day pump inhibitors for 1 month and then if doing well can decrease to once a day continue clear liquids for a day or 2 hold aspirin and non-steroidal's long-term continue MiraLAX for his ileus and constipation okay with me to use subcutaneous Lovenox if needed and will follow with youand consider repeat endoscopy in 2-3 months to document healing   Dg Chest 2 View  05/16/2015   CLINICAL DATA:  Abdominal pain 4 days. Nausea and vomiting with constipation.  EXAM: CHEST  2 VIEW  COMPARISON:  03/18/2013 and 03/10/2013  FINDINGS: Lungs are hyperexpanded with flattening of the hemidiaphragms. There is no focal consolidation or effusion. There is borderline cardiomegaly. Calcified plaque is present over the thoracic aorta. Small nodular cluster over the lateral right midlung unchanged likely calcified granulomas. Remainder of the exam is unchanged.  IMPRESSION: No acute cardiopulmonary disease.  COPD.   Electronically Signed   By: Elberta Fortis M.D.   On: 05/16/2015 16:17   Ct Abdomen Pelvis W Contrast  05/24/2015   CLINICAL DATA:  Abdominal pain, nausea and vomiting. No bowel movement since a bilateral inguinal hernia repair 3 days ago.  EXAM: CT ABDOMEN AND PELVIS WITH CONTRAST  TECHNIQUE: Multidetector CT imaging of the abdomen and pelvis was performed using the standard protocol following bolus administration of intravenous contrast.  CONTRAST:  75mL OMNIPAQUE IOHEXOL 300 MG/ML  SOLN  COMPARISON:  05/16/2015.  FINDINGS: Dilated stomach filled with oral contrast and air with little change in degree of dilatation. Interval dilated, gas-filled loops of bowel in the anterior abdomen. These appear to represent a combination of dilated small bowel and sigmoid colon. The remainder of the colon and small bowel are not dilated. No free peritoneal fluid or air.  Interval repair of the previously seen bilateral inguinal hernias with  associated postoperative air swelling in both inguinal canals. There is also postoperative air in the anterior abdominal wall at the level of the pelvis bilaterally.  Markedly enlarged, heterogeneous prostate gland is again demonstrated. Anterior bladder wall thickening is also again demonstrated without a focal mass. No hydronephrosis or hydroureter. A large left renal cyst is again demonstrated.  Unremarkable liver, spleen, pancreas, gallbladder and adrenal glands. No enlarged lymph nodes. Atheromatous arterial calcifications. Clear lung bases. Extensive lumbar and lower thoracic spine degenerative changes.  IMPRESSION: 1. Probable postoperative ileus involving central bowel loops. 2. Chronic gastric distention. 3. Status post surgically repaired bilateral inguinal hernias with expected postoperative changes. 4. Stable markedly enlarged and heterogeneous prostate gland. 5. Diffuse anterior bladder wall thickening, most likely due to chronic bladder outlet obstruction by the enlarged prostate gland.   Electronically Signed   By: Beckie Salts M.D.   On: 05/24/2015 15:36   Ct Abdomen Pelvis W Contrast  05/16/2015   CLINICAL DATA:  Severe lower abdominal pain for 4 days, constipation, history hypertension, former smoker, peptic ulcer disease  EXAM: CT ABDOMEN AND PELVIS WITH CONTRAST  TECHNIQUE: Multidetector CT imaging of the abdomen and pelvis was performed using the standard protocol following bolus administration of intravenous contrast. Sagittal and coronal MPR images reconstructed from axial data set.  CONTRAST:  OMNIPAQUE IOHEXOL 300 MG/ML SOLN IV. Dilute oral contrast.  COMPARISON:  09/29/2014  FINDINGS: Lung bases emphysematous but clear.  Large cyst arising from anterior aspect of mid LEFT kidney 7.5 x 5.4 x 7.1 cm.  Liver, spleen, pancreas, kidneys, and adrenal glands otherwise normal.  Stomach distended by contrast and food debris.  Appendix, cecum, and terminal ileum located within a large RIGHT  inguinal hernia.  Numerous small bowel loops within a large LEFT inguinal hernia.  Bowel loops otherwise unremarkable without evidence of bowel obstruction.  Extensive atherosclerotic calcifications.  Marked prostatic enlargement, gland 6.5 x 4.9 x 6.3 cm.  No mass, adenopathy, free air or free fluid.  Osseous demineralization with degenerative disc and facet disease changes of the lumbar spine.  IMPRESSION: Large LEFT renal cyst.  Small bowel loops within large LEFT inguinal hernia without obstruction.  Appendix, cecum and terminal ileum within large RIGHT inguinal hernia without obstruction.  Marked prostatic enlargement.  Extensive atherosclerotic disease.   Electronically Signed   By: Ulyses Southward M.D.   On: 05/16/2015 17:19   Dg Chest Port 1 View  05/24/2015   CLINICAL DATA:  81 year old male with abdominal pain, nausea, vomiting. Dark stools x1 week. Tachycardic. Possible sepsis. Initial encounter.  EXAM: PORTABLE CHEST - 1 VIEW  COMPARISON:  05/16/2015 and earlier.  FINDINGS: Portable AP semi upright view at 1739 hrs. Stable cardiomegaly and mediastinal contours. Chronic calcified aortic atherosclerosis. Stable small calcified granulomas and/or calcified pleural plaques in the right lung. Otherwise, allowing for portable technique, the lungs are clear. No pneumothorax, pneumoperitoneum, or pleural effusion identified. Advanced degenerative changes at the shoulders.  IMPRESSION: No acute cardiopulmonary abnormality.   Electronically Signed   By: Odessa Fleming M.D.   On: 05/24/2015 17:47   Dg Abd 2 Views  05/17/2015   CLINICAL DATA:  Inguinal hernia, mid abdominal pain.  EXAM: ABDOMEN - 2 VIEW  COMPARISON:  CT 05/16/2015  FINDINGS: Oral contrast material seen within the colon. Mild diffuse gaseous distention of bowel. No evidence of bowel obstruction. No free air. No organomegaly.  IMPRESSION: Mild diffuse gaseous distention of bowel, likely mild ileus. No evidence of bowel obstruction or free air.    Electronically Signed   By: Charlett Nose M.D.   On: 05/17/2015 12:06    Micro Results     Recent Results (from the past 240 hour(s))  Culture, blood (x 2)     Status: None (Preliminary result)   Collection Time: 05/24/15  6:45 PM  Result Value Ref Range Status   Specimen Description BLOOD LEFT ANTECUBITAL  Final   Special Requests IN PEDIATRIC BOTTLE 4CC  Final   Culture NO GROWTH 3 DAYS  Final   Report Status PENDING  Incomplete  Culture, blood (x 2)     Status: None (Preliminary result)   Collection Time: 05/24/15  6:56 PM  Result Value Ref Range Status   Specimen Description BLOOD LEFT HAND  Final   Special Requests IN PEDIATRIC BOTTLE 2CC  Final   Culture NO GROWTH 3 DAYS  Final   Report Status PENDING  Incomplete  MRSA PCR Screening     Status: None   Collection Time: 05/24/15  7:11 PM  Result Value Ref Range Status   MRSA by PCR NEGATIVE NEGATIVE Final    Comment:        The GeneXpert MRSA Assay (FDA approved for NASAL specimens only), is one component of a comprehensive MRSA colonization surveillance program. It is not intended to diagnose MRSA infection nor to guide or monitor treatment for MRSA infections.     Today   Subjective    Seyon Strader today has no headache,no chest abdominal pain,no new weakness tingling or numbness, feels much better  wants to go home today.     Objective   Blood pressure 140/52, pulse 69, temperature 97.6 F (36.4 C), temperature source Oral, resp. rate 16, height 5\' 6"  (1.676 m), weight 61.73 kg (136 lb 1.4 oz), SpO2 100 %.   Intake/Output Summary (Last 24 hours) at 05/28/15 0939 Last data filed at 05/28/15 0600  Gross per 24 hour  Intake    825 ml  Output    450 ml  Net    375 ml    Exam  Awake Alert, Oriented x 3, No new F.N deficits, Normal affect [place].AT,PERRAL Supple Neck,No JVD, No cervical lymphadenopathy appriciated.  Symmetrical Chest wall movement, Good air movement bilaterally, CTAB RRR,No Gallops,Rubs or  new Murmurs, No Parasternal Heave +ve B.Sounds, Abd Soft, Non tender, No organomegaly appriciated, No rebound -guarding or rigidity. No Cyanosis, Clubbing or edema, No new Rash or bruise     Data Review   CBC w Diff:  Lab Results  Component Value Date   WBC 7.5 05/27/2015   HGB 9.0* 05/27/2015   HCT 27.6* 05/27/2015   PLT 419* 05/27/2015   LYMPHOPCT 8* 05/24/2015   MONOPCT 7 05/24/2015   EOSPCT 0 05/24/2015   BASOPCT 0 05/24/2015    CMP:  Lab Results  Component Value Date   NA 135 05/27/2015   K 4.1 05/27/2015   CL 103 05/27/2015   CO2 27 05/27/2015   BUN 5* 05/27/2015   CREATININE 0.98 05/27/2015   PROT 5.6* 05/26/2015   ALBUMIN 2.3* 05/26/2015   BILITOT 0.6 05/26/2015   ALKPHOS 79 05/26/2015   AST 21 05/26/2015   ALT 18 05/26/2015      Total Time in preparing paper work, data evaluation and todays exam - 35 minutes  Susa Raring K M.D on 05/28/2015 at 9:39 AM  [facility]   Office  [phone]

## 2015-05-28 NOTE — Discharge Instructions (Signed)
Follow with Primary MD POLITE,RONALD D, MD in 7 days  ° °Get CBC, CMP, 2 view Chest X ray checked  by Primary MD next visit.  ° ° °Activity: As tolerated with Full fall precautions use walker/cane & assistance as needed ° ° °Disposition Home   ° ° °Diet:   Heart Healthy   ° °For Heart failure patients - Check your Weight same time everyday, if you gain over 2 pounds, or you develop in leg swelling, experience more shortness of breath or chest pain, call your Primary MD immediately. Follow Cardiac Low Salt Diet and 1.5 lit/day fluid restriction. ° ° °On your next visit with your primary care physician please Get Medicines reviewed and adjusted. ° ° °Please request your Prim.MD to go over all Hospital Tests and Procedure/Radiological results at the follow up, please get all Hospital records sent to your Prim MD by signing hospital release before you go home. ° ° °If you experience worsening of your admission symptoms, develop shortness of breath, life threatening emergency, suicidal or homicidal thoughts you must seek medical attention immediately by calling 911 or calling your MD immediately  if symptoms less severe. ° °You Must read complete instructions/literature along with all the possible adverse reactions/side effects for all the Medicines you take and that have been prescribed to you. Take any new Medicines after you have completely understood and accpet all the possible adverse reactions/side effects.  ° °Do not drive, operating heavy machinery, perform activities at heights, swimming or participation in water activities or provide baby sitting services if your were admitted for syncope or siezures until you have seen by Primary MD or a Neurologist and advised to do so again. ° °Do not drive when taking Pain medications.  ° ° °Do not take more than prescribed Pain, Sleep and Anxiety Medications ° °Special Instructions: If you have smoked or chewed Tobacco  in the last 2 yrs please stop smoking, stop any  regular Alcohol  and or any Recreational drug use. ° °Wear Seat belts while driving. ° ° °Please note ° °You were cared for by a hospitalist during your hospital stay. If you have any questions about your discharge medications or the care you received while you were in the hospital after you are discharged, you can call the unit and asked to speak with the hospitalist on call if the hospitalist that took care of you is not available. Once you are discharged, your primary care physician will handle any further medical issues. Please note that NO REFILLS for any discharge medications will be authorized once you are discharged, as it is imperative that you return to your primary care physician (or establish a relationship with a primary care physician if you do not have one) for your aftercare needs so that they can reassess your need for medications and monitor your lab values. ° °

## 2015-05-28 NOTE — Clinical Social Work Placement (Signed)
   CLINICAL SOCIAL WORK PLACEMENT  NOTE  Date:  05/28/2015  Patient Details  Name: Luis Norton MRN: [id number] Date of Birth: 11-May-1922  Clinical Social Work is seeking post-discharge placement for this patient at the Skilled  Nursing Facility level of care (*CSW will initial, date and re-position this form in  chart as items are completed):  Yes   Patient/family provided with [facility] Clinical Social Work Department's list of facilities offering this level of care within the geographic area requested by the patient (or if unable, by the patient's family).  Yes   Patient/family informed of their freedom to choose among providers that offer the needed level of care, that participate in Medicare, Medicaid or managed care program needed by the patient, have an available bed and are willing to accept the patient.  Yes   Patient/family informed of [facility]'s ownership interest in [facility] and [facility], as well as of the fact that they are under no obligation to receive care at these facilities.  PASRR submitted to EDS on 05/28/15     PASRR number received on 05/28/15     Existing PASRR number confirmed on       FL2 transmitted to all facilities in geographic area requested by pt/family on 05/28/15     FL2 transmitted to all facilities within larger geographic area on 05/28/15     Patient informed that his/her managed care company has contracts with or will negotiate with certain facilities, including the following:        Yes   Patient/family informed of bed offers received.  Patient chooses bed at [facility]     Physician recommends and patient chooses bed at      Patient to be transferred to [facility] on 05/28/15.  Patient to be transferred to facility by Ambulance [facility])     Patient family notified on 05/28/15 of transfer.  Name of family member notified:  Robinette Haines, daughter     PHYSICIAN       Additional Comment:     _______________________________________________ Cristobal Goldmann, LCSW 05/28/2015, 3:58 PM

## 2015-05-28 NOTE — Clinical Social Work Note (Signed)
Clinical Social Work Assessment  Patient Details  Name: Luis Norton MRN: [id number] Date of Birth: [birth year]  Date of referral:  05/28/15               Reason for consult:  Facility Placement                Permission sought to share information with:  Family Supports Permission granted to share information::  Yes, Verbal Permission Granted  Name::     Luis Norton, Luis Norton  Agency::     Relationship::  Daughter  Contact Information:  [phone]  Housing/Transportation Living arrangements for the past 2 months:  Single Family Home Source of Information:  Patient, Adult Children Patient Interpreter Needed:  None Criminal Activity/Legal Involvement Pertinent to Current Situation/Hospitalization:  No - Comment as needed Significant Relationships:  Adult Children, Other Family Members Lives with:  Relatives (Patient was living with his niece, Luis Norton) Do you feel safe going back to the place where you live?  No (Patient will no longer have the help he needs at home) Need for family participation in patient care:  Yes (Comment)  Care giving concerns:  Daughter concerned about patient going back to his nieces home Luis Norton) as she works and the aide that would be with patient now works for someone else and can no longer assist patient.   Social Worker assessment / plan: CSW talked with patient and his daughter Luis Norton (at the bedside) regarding SNF placement. Patient was sitting up in a chair, alert and oriented. Both Luis Norton and the patient were open to talking with CSW about discharge planning. Luis Norton explained to CSW that patient was living with his niece (her cousin), but Luis Norton works and the aide that assisted her dad with his care is now working with someone else. Luis Norton added that she works and cannot care for patient.  Luis Norton informed CSW that she has 3 sisters and 1 brother (incarcerated), however they are less involved. CSW also spoke directly  with patient and he expressed agreement to going to a facility if this is what he has to do.   Employment status:  Retired Health and safety inspector:  Medicare PT Recommendations:  Home with Home Health Information / Referral to community resources:  Skilled Holiday representative (Daughter/patient interested in skilled nursing facility and SNF list given for [facility])  Patient/Family's Response to care:  Daughter concerned about patient's discharge plan as his niece can no longer provide appropriate level of care.  Patient/Family's Understanding of and Emotional Response to Diagnosis, Current Treatment, and Prognosis:  Not discussed  Emotional Assessment Appearance:  Appears younger than stated age Attitude/Demeanor/Rapport:  Other (Appropriate) Affect (typically observed):  Pleasant, Appropriate Orientation:  Oriented to Self, Oriented to Place, Oriented to  Time, Oriented to Situation Alcohol / Substance use:  Tobacco Use (Patient reports that he quit smoking and does not drink or use illicit drugs) Psych involvement (Current and /or in the community):  No (Comment)  Discharge Needs  Concerns to be addressed:  Discharge Planning Concerns, Lack of Support Readmission within the last 30 days:  Yes Current discharge risk:  Lack of support system Barriers to Discharge:  No Barriers Identified   Cristobal Goldmann, LCSW 05/28/2015, 2:16 PM

## 2015-05-28 NOTE — Care Management Important Message (Signed)
Important Message  Patient Details  Name: Luis Norton MRN: [id number] Date of Birth: 03-Jun-1922   Medicare Important Message Given:  Yes-second notification given    Orson Aloe 05/28/2015, 1:47 PM

## 2015-05-28 NOTE — Progress Notes (Signed)
Report called to [facility], spoke with [place].  All questions answered.

## 2015-05-29 ENCOUNTER — Encounter: Payer: Self-pay | Admitting: Adult Health

## 2015-05-29 ENCOUNTER — Non-Acute Institutional Stay (SKILLED_NURSING_FACILITY): Payer: Medicare Other | Admitting: Adult Health

## 2015-05-29 DIAGNOSIS — I482 Chronic atrial fibrillation, unspecified: Secondary | ICD-10-CM

## 2015-05-29 DIAGNOSIS — E43 Unspecified severe protein-calorie malnutrition: Secondary | ICD-10-CM

## 2015-05-29 DIAGNOSIS — R531 Weakness: Secondary | ICD-10-CM

## 2015-05-29 DIAGNOSIS — K4021 Bilateral inguinal hernia, without obstruction or gangrene, recurrent: Secondary | ICD-10-CM

## 2015-05-29 DIAGNOSIS — I10 Essential (primary) hypertension: Secondary | ICD-10-CM

## 2015-05-29 DIAGNOSIS — D62 Acute posthemorrhagic anemia: Secondary | ICD-10-CM | POA: Diagnosis not present

## 2015-05-29 DIAGNOSIS — K922 Gastrointestinal hemorrhage, unspecified: Secondary | ICD-10-CM

## 2015-05-29 LAB — CULTURE, BLOOD (ROUTINE X 2)
Culture: NO GROWTH
Culture: NO GROWTH

## 2015-05-31 NOTE — Progress Notes (Signed)
Patient ID: Luis Norton, male   DOB: 02-12-1922, 81 y.o.   MRN: [id number]    DATE:  05/29/15 MRN:  [id number]  BIRTHDAY: 05/11/1922  Facility:  Nursing Home Location:  [facility]  Nursing Home Room Number: 1008-P  LEVEL OF CARE:  [phone])  Contact Information    Name Relation Home Work [place] Other [phone]     Liggins,Glenda Niece [phone]  [phone]      Chief Complaint  Patient presents with  . Hospitalization Follow-up    Generalized weakness, upper GI bleed, atrial fibrillation, inguinal hernia S/P repair, hypertension, protein calorie malnutrition and anemia    HISTORY OF PRESENT ILLNESS:  This is a 81 year old male who was been admitted to [facility] on 05/28/15 from [facility]. He has history of cognitive impairment, atrial fibrillation, hypertension, BPH, peptic ulcer disease and hyperlipidemia. He was recently discharged from the hospital on 05/22/15 after a repair of inguinal incarcerated hernias. He has been complaining of melanotic stools. EGD done on 05/26/15 showed severe distal and mid esophageal ulcers with esophagitis. Aspirin was put on hold and was started on Protonix twice a day. Pyloric ulcer biopsy was positive for H. Pylori.  He has been admitted for a short-term rehabilitation.  PAST MEDICAL HISTORY:  Past Medical History  Diagnosis Date  . Hypertension   . Hypercholesterolemia   . ED (erectile dysfunction)   . Urinary frequency     bladder instablity and outlet obstruction   . Constipation   . PUD (peptic ulcer disease)     history of   . BPH (benign prostatic hyperplasia)   . Atrial fibrillation      CURRENT MEDICATIONS: Reviewed  Patient's Medications  New Prescriptions   No medications on file  Previous Medications   BISACODYL (DULCOLAX) 5 MG EC TABLET    1 tab q hs Prn constipation   METOPROLOL SUCCINATE (TOPROL-XL) 25 MG 24 HR TABLET    Take 1 tablet (25 mg total) by mouth daily.   PANTOPRAZOLE (PROTONIX) 40 MG TABLET    Take 1 tablet (40 mg total) by mouth 2 (two) times daily. Switch for any other PPI at similar dose and frequency   POLYETHYLENE GLYCOL POWDER (GLYCOLAX/MIRALAX) POWDER    Take 255 g by mouth daily as needed.   TRAMADOL (ULTRAM) 50 MG TABLET    Take 0.5-1 tablets (25-50 mg total) by mouth every 6 (six) hours as needed for severe pain.  Modified Medications   No medications on file  Discontinued Medications   No medications on file     Allergies  Allergen Reactions  . Penicillins Swelling     REVIEW OF SYSTEMS:  GENERAL: no change in appetite, no fatigue, no weight changes, no fever, chills or weakness EYES: Denies change in vision, dry eyes, eye pain, itching or discharge EARS: Denies change in hearing, ringing in ears, or earache NOSE: Denies nasal congestion or epistaxis MOUTH and THROAT: Denies oral discomfort, gingival pain or bleeding, pain from teeth or hoarseness   RESPIRATORY: no cough, SOB, DOE, wheezing, hemoptysis CARDIAC: no chest pain, edema or palpitations GI: no diarrhea, constipation, heart burn, nausea or vomiting GU: Denies dysuria, frequency, hematuria, incontinence, or discharge PSYCHIATRIC: Denies feeling of depression or anxiety. No report of hallucinations, insomnia, paranoia, or agitation    PHYSICAL EXAMINATION  GENERAL APPEARANCE: In no acute distress. Normal body habitus SKIN:  Suprapubic surgical incision is dry, no erythema HEAD: Normal in size and contour. No  evidence of trauma EYES: Lids open and close normally. No blepharitis, entropion or ectropion. PERRL. Conjunctivae are clear and sclerae are white. Lenses are without opacity EARS: Pinnae are normal. Patient hears normal voice tunes of the examiner MOUTH and THROAT: Lips are without lesions. Oral mucosa is moist and without lesions. Tongue is normal in shape, size, and color and without lesions NECK: supple, trachea midline, no neck masses, no thyroid  tenderness, no thyromegaly LYMPHATICS: no LAN in the neck, no supraclavicular LAN RESPIRATORY: breathing is even & unlabored, BS CTAB CARDIAC: RRR, no murmur,no extra heart sounds, no edema GI: abdomen soft, normal BS, no masses, no hepatomegaly, no splenomegaly EXTREMITIES:  Able to move 4 extremities PSYCHIATRIC: Alert and oriented X 3. Affect and behavior are appropriate  LABS/RADIOLOGY: Labs reviewed: Basic Metabolic Panel:  Recent Labs  16/10/96 2103  05/25/15 0238 05/26/15 0555 05/27/15 0525  NA  --   < > 137 140 135  K  --   < > 4.7 4.3 4.1  CL  --   < > 102 108 103  CO2  --   < > 27 24 27   GLUCOSE  --   < > 96 75 84  BUN  --   < > 12 <5* 5*  CREATININE  --   < > 0.86 0.89 0.98  CALCIUM  --   < > 8.4* 8.6* 8.2*  MG 1.9  --   --   --   --   < > = values in this interval not displayed. Liver Function Tests:  Recent Labs  05/24/15 1135 05/24/15 1845 05/26/15 0555  AST 36 23 21  ALT 24 20 18   ALKPHOS 106 91 79  BILITOT 0.6 0.7 0.6  PROT 7.2 5.9* 5.6*  ALBUMIN 3.1* 2.6* 2.3*    Recent Labs  08/15/14 1730 09/29/14 1325 12/02/14 1435  LIPASE 42 31 35   CBC:  Recent Labs  12/02/14 1435  05/17/15 1406  05/24/15 1845 05/25/15 0238 05/26/15 0555 05/27/15 0525  WBC 9.0  < > 8.9  < > 17.2* 14.3* 8.4 7.5  NEUTROABS 7.2  --  6.1  --  14.5*  --   --   --   HGB 13.8  < > 12.5*  < > 10.5* 8.9* 10.0* 9.0*  HCT 41.6  < > 37.6*  < > 32.0* 26.9* 30.1* 27.6*  MCV 83.9  < > 83.0  < > 82.1 83.0 82.2 82.6  PLT 279  < > 391  < > 434* 431* 413* 419*  < > = values in this interval not displayed.  Cardiac Enzymes:  Recent Labs  05/16/15 1905  TROPONINI 0.03   CBG:  Recent Labs  09/29/14 1649  GLUCAP 79     Dg Chest 2 View  05/16/2015   CLINICAL DATA:  Abdominal pain 4 days. Nausea and vomiting with constipation.  EXAM: CHEST  2 VIEW  COMPARISON:  03/18/2013 and 03/10/2013  FINDINGS: Lungs are hyperexpanded with flattening of the hemidiaphragms. There is  no focal consolidation or effusion. There is borderline cardiomegaly. Calcified plaque is present over the thoracic aorta. Small nodular cluster over the lateral right midlung unchanged likely calcified granulomas. Remainder of the exam is unchanged.  IMPRESSION: No acute cardiopulmonary disease.  COPD.   Electronically Signed   By: Elberta Fortis M.D.   On: 05/16/2015 16:17   Ct Abdomen Pelvis W Contrast  05/24/2015   CLINICAL DATA:  Abdominal pain, nausea and vomiting. No bowel movement since  a bilateral inguinal hernia repair 3 days ago.  EXAM: CT ABDOMEN AND PELVIS WITH CONTRAST  TECHNIQUE: Multidetector CT imaging of the abdomen and pelvis was performed using the standard protocol following bolus administration of intravenous contrast.  CONTRAST:  75mL OMNIPAQUE IOHEXOL 300 MG/ML  SOLN  COMPARISON:  05/16/2015.  FINDINGS: Dilated stomach filled with oral contrast and air with little change in degree of dilatation. Interval dilated, gas-filled loops of bowel in the anterior abdomen. These appear to represent a combination of dilated small bowel and sigmoid colon. The remainder of the colon and small bowel are not dilated. No free peritoneal fluid or air.  Interval repair of the previously seen bilateral inguinal hernias with associated postoperative air swelling in both inguinal canals. There is also postoperative air in the anterior abdominal wall at the level of the pelvis bilaterally.  Markedly enlarged, heterogeneous prostate gland is again demonstrated. Anterior bladder wall thickening is also again demonstrated without a focal mass. No hydronephrosis or hydroureter. A large left renal cyst is again demonstrated.  Unremarkable liver, spleen, pancreas, gallbladder and adrenal glands. No enlarged lymph nodes. Atheromatous arterial calcifications. Clear lung bases. Extensive lumbar and lower thoracic spine degenerative changes.  IMPRESSION: 1. Probable postoperative ileus involving central bowel loops. 2.  Chronic gastric distention. 3. Status post surgically repaired bilateral inguinal hernias with expected postoperative changes. 4. Stable markedly enlarged and heterogeneous prostate gland. 5. Diffuse anterior bladder wall thickening, most likely due to chronic bladder outlet obstruction by the enlarged prostate gland.   Electronically Signed   By: Beckie Salts M.D.   On: 05/24/2015 15:36   Ct Abdomen Pelvis W Contrast  05/16/2015   CLINICAL DATA:  Severe lower abdominal pain for 4 days, constipation, history hypertension, former smoker, peptic ulcer disease  EXAM: CT ABDOMEN AND PELVIS WITH CONTRAST  TECHNIQUE: Multidetector CT imaging of the abdomen and pelvis was performed using the standard protocol following bolus administration of intravenous contrast. Sagittal and coronal MPR images reconstructed from axial data set.  CONTRAST:  OMNIPAQUE IOHEXOL 300 MG/ML SOLN IV. Dilute oral contrast.  COMPARISON:  09/29/2014  FINDINGS: Lung bases emphysematous but clear.  Large cyst arising from anterior aspect of mid LEFT kidney 7.5 x 5.4 x 7.1 cm.  Liver, spleen, pancreas, kidneys, and adrenal glands otherwise normal.  Stomach distended by contrast and food debris.  Appendix, cecum, and terminal ileum located within a large RIGHT inguinal hernia.  Numerous small bowel loops within a large LEFT inguinal hernia.  Bowel loops otherwise unremarkable without evidence of bowel obstruction.  Extensive atherosclerotic calcifications.  Marked prostatic enlargement, gland 6.5 x 4.9 x 6.3 cm.  No mass, adenopathy, free air or free fluid.  Osseous demineralization with degenerative disc and facet disease changes of the lumbar spine.  IMPRESSION: Large LEFT renal cyst.  Small bowel loops within large LEFT inguinal hernia without obstruction.  Appendix, cecum and terminal ileum within large RIGHT inguinal hernia without obstruction.  Marked prostatic enlargement.  Extensive atherosclerotic disease.   Electronically Signed    By: Ulyses Southward M.D.   On: 05/16/2015 17:19   Dg Chest Port 1 View  05/24/2015   CLINICAL DATA:  80 year old male with abdominal pain, nausea, vomiting. Dark stools x1 week. Tachycardic. Possible sepsis. Initial encounter.  EXAM: PORTABLE CHEST - 1 VIEW  COMPARISON:  05/16/2015 and earlier.  FINDINGS: Portable AP semi upright view at 1739 hrs. Stable cardiomegaly and mediastinal contours. Chronic calcified aortic atherosclerosis. Stable small calcified granulomas and/or calcified pleural plaques  in the right lung. Otherwise, allowing for portable technique, the lungs are clear. No pneumothorax, pneumoperitoneum, or pleural effusion identified. Advanced degenerative changes at the shoulders.  IMPRESSION: No acute cardiopulmonary abnormality.   Electronically Signed   By: Odessa Fleming M.D.   On: 05/24/2015 17:47   Dg Abd 2 Views  05/17/2015   CLINICAL DATA:  Inguinal hernia, mid abdominal pain.  EXAM: ABDOMEN - 2 VIEW  COMPARISON:  CT 05/16/2015  FINDINGS: Oral contrast material seen within the colon. Mild diffuse gaseous distention of bowel. No evidence of bowel obstruction. No free air. No organomegaly.  IMPRESSION: Mild diffuse gaseous distention of bowel, likely mild ileus. No evidence of bowel obstruction or free air.   Electronically Signed   By: Charlett Nose M.D.   On: 05/17/2015 12:06    ASSESSMENT/PLAN:  Generalized weakness - for rehabilitation  Upper GI bleed/ulcerative esophagitis - hold aspirin; this was positive for H. pylori; follow-up with Dr. Ewing Schlein, gastroenterologists, in 1 week; continue Protonix 40 mg twice a day  Atrial fibrillation - rate controlled; aspirin on hold; continue Toprol-XL 25 mg by mouth daily; not anticoagulation due to fall risk  Inguinal hernia S/P repair - pain is well controlled; continue tramadol 5 mg 1/2-1 tab by mouth every 6 hours when necessary  Hypertension - well-controlled; continue Toprol-XL 25 mg 1 tab by mouth daily  Protein calorie malnutrition, severe  - albumin 2.3; RD consult; check BMP  Anemia, acute blood loss - hemoglobin 9.0; check CBC    Goals of care:  Short-term rehabilitation    [facility], NP [facility] [phone]

## 2015-06-01 ENCOUNTER — Non-Acute Institutional Stay (SKILLED_NURSING_FACILITY): Payer: Medicare Other | Admitting: Internal Medicine

## 2015-06-01 DIAGNOSIS — E46 Unspecified protein-calorie malnutrition: Secondary | ICD-10-CM

## 2015-06-01 DIAGNOSIS — K5900 Constipation, unspecified: Secondary | ICD-10-CM | POA: Diagnosis not present

## 2015-06-01 DIAGNOSIS — R531 Weakness: Secondary | ICD-10-CM | POA: Diagnosis not present

## 2015-06-01 DIAGNOSIS — I482 Chronic atrial fibrillation, unspecified: Secondary | ICD-10-CM

## 2015-06-01 DIAGNOSIS — K922 Gastrointestinal hemorrhage, unspecified: Secondary | ICD-10-CM

## 2015-06-01 DIAGNOSIS — Z9889 Other specified postprocedural states: Secondary | ICD-10-CM

## 2015-06-01 DIAGNOSIS — K209 Esophagitis, unspecified without bleeding: Secondary | ICD-10-CM

## 2015-06-01 DIAGNOSIS — E8809 Other disorders of plasma-protein metabolism, not elsewhere classified: Secondary | ICD-10-CM

## 2015-06-01 DIAGNOSIS — I10 Essential (primary) hypertension: Secondary | ICD-10-CM

## 2015-06-01 DIAGNOSIS — R4189 Other symptoms and signs involving cognitive functions and awareness: Secondary | ICD-10-CM

## 2015-06-01 DIAGNOSIS — Z8719 Personal history of other diseases of the digestive system: Secondary | ICD-10-CM

## 2015-06-01 NOTE — Progress Notes (Signed)
Patient ID: Luis Norton, male   DOB: August 12, 1922, 80 y.o.   MRN: [id number]      [facility]   PCP: Katy Apo, MD  Code Status: full code  Allergies  Allergen Reactions  . Penicillins Swelling    Chief Complaint  Patient presents with  . New Admit To SNF     HPI:  81 y.o. patient is here for short term rehabilitation post hospital admission from 05/24/15-05/28/15 with sepsis, esophagitis and acute renal failure. He had undergone repair of incarcerated inguinal hernia prior to this admission and was discharged on 05/22/15. Ct abdomen this abdomen ruled out acute findings. General surgery had evaluated and cleared the patient. He underwent EGD by GI on 05/26/15 showing esophageal ulcer and esophagitis with H.Pylori. He is on PPI bid for 2 weeks and pending outpatient gi follow up in 2 weeks.  His aspirin has been on hold with concern for gi bleed. He is seen in his room today. He is getting around with a walker and feels his strength to be coming back. He denies any concerns this visit.  Review of Systems:  Constitutional: Negative for fever, chills, diaphoresis.  HENT: Negative for headache, congestion, nasal discharge Eyes: Negative for eye pain, blurred vision, double vision and discharge.  Respiratory: Negative for cough, shortness of breath and wheezing.   Cardiovascular: Negative for chest pain, palpitations, leg swelling.  Gastrointestinal: Negative for heartburn, nausea, vomiting, abdominal pain. Had bowel movement this am, denies melena Genitourinary: Negative for dysuria and flank pain.  Musculoskeletal: Negative for back pain, falls Skin: Negative for itching, rash.  Neurological: Negative for dizziness, tingling, focal weakness Psychiatric/Behavioral: Negative for depression   Past Medical History  Diagnosis Date  . Hypertension   . Hypercholesterolemia   . ED (erectile dysfunction)   . Urinary frequency     bladder instablity and  outlet obstruction   . Constipation   . PUD (peptic ulcer disease)     history of   . BPH (benign prostatic hyperplasia)   . Atrial fibrillation    Past Surgical History  Procedure Laterality Date  . Ankle surgery Right   . Inguinal hernia repair Bilateral 05/20/2015    Procedure: HERNIA REPAIR INGUINAL ADULT BILATERAL;  Surgeon: Abigail Miyamoto, MD;  Location: [facility] OR;  Service: General;  Laterality: Bilateral;  . Insertion of mesh Bilateral 05/20/2015    Procedure: INSERTION OF MESH;  Surgeon: Abigail Miyamoto, MD;  Location: [facility] OR;  Service: General;  Laterality: Bilateral;  . Esophagogastroduodenoscopy N/A 05/26/2015    Procedure: ESOPHAGOGASTRODUODENOSCOPY (EGD);  Surgeon: Vida Rigger, MD;  Location: [facility] ENDOSCOPY;  Service: Endoscopy;  Laterality: N/A;   Social History:   reports that he has quit smoking. He does not have any smokeless tobacco history on file. He reports that he does not drink alcohol or use illicit drugs.  No family history on file.  Medications:   Medication List       This list is accurate as of: 06/01/15 12:36 PM.  Always use your most recent med list.               bisacodyl 5 MG EC tablet  Commonly known as:  DULCOLAX  1 tab q hs Prn constipation     metoprolol succinate 25 MG 24 hr tablet  Commonly known as:  TOPROL-XL  Take 1 tablet (25 mg total) by mouth daily.     pantoprazole 40 MG tablet  Commonly known as:  PROTONIX  Take  1 tablet (40 mg total) by mouth 2 (two) times daily. Switch for any other PPI at similar dose and frequency     polyethylene glycol powder powder  Commonly known as:  GLYCOLAX/MIRALAX  Take 255 g by mouth daily as needed.     traMADol 50 MG tablet  Commonly known as:  ULTRAM  Take 0.5-1 tablets (25-50 mg total) by mouth every 6 (six) hours as needed for severe pain.         Physical Exam Filed Vitals:   06/01/15 1234  BP: 123/65  Pulse: 67  Temp: 99 F (37.2 C)  Resp: 16  SpO2: 98%    General- elderly  male, thin built, in no acute distress, muscle wasting noted Head- normocephalic, atraumatic Nose- normal nasal mucosa, no maxillary or frontal sinus tenderness, no nasal discharge Throat- moist mucus membrane Eyes- PERRLA, EOMI, no pallor, no icterus, no discharge, normal conjunctiva, normal sclera Neck- no cervical lymphadenopathy Cardiovascular- normal s1,s2, no murmurs, palpable dorsalis pedis and radial pulses, trace ankle edema Respiratory- bilateral clear to auscultation, no wheeze, no rhonchi, no crackles, no use of accessory muscles Abdomen- bowel sounds present, soft, non tender Musculoskeletal- able to move all 4 extremities, generalized weakness, unsteady gait, ROM with both shoulder limited Neurological- no focal deficit, alert and oriented to person, place and time Skin- warm and dry, surgical incision in bilateral lower abdominal wall area healing well Psychiatry- normal mood and affect    Labs reviewed: Basic Metabolic Panel:  Recent Labs  16/10/96 2103  05/25/15 0238 05/26/15 0555 05/27/15 0525  NA  --   < > 137 140 135  K  --   < > 4.7 4.3 4.1  CL  --   < > 102 108 103  CO2  --   < > 27 24 27   GLUCOSE  --   < > 96 75 84  BUN  --   < > 12 <5* 5*  CREATININE  --   < > 0.86 0.89 0.98  CALCIUM  --   < > 8.4* 8.6* 8.2*  MG 1.9  --   --   --   --   < > = values in this interval not displayed. Liver Function Tests:  Recent Labs  05/24/15 1135 05/24/15 1845 05/26/15 0555  AST 36 23 21  ALT 24 20 18   ALKPHOS 106 91 79  BILITOT 0.6 0.7 0.6  PROT 7.2 5.9* 5.6*  ALBUMIN 3.1* 2.6* 2.3*    Recent Labs  08/15/14 1730 09/29/14 1325 12/02/14 1435  LIPASE 42 31 35   No results for input(s): AMMONIA in the last 8760 hours. CBC:  Recent Labs  12/02/14 1435  05/17/15 1406  05/24/15 1845 05/25/15 0238 05/26/15 0555 05/27/15 0525  WBC 9.0  < > 8.9  < > 17.2* 14.3* 8.4 7.5  NEUTROABS 7.2  --  6.1  --  14.5*  --   --   --   HGB 13.8  < > 12.5*  < > 10.5*  8.9* 10.0* 9.0*  HCT 41.6  < > 37.6*  < > 32.0* 26.9* 30.1* 27.6*  MCV 83.9  < > 83.0  < > 82.1 83.0 82.2 82.6  PLT 279  < > 391  < > 434* 431* 413* 419*  < > = values in this interval not displayed. Cardiac Enzymes:  Recent Labs  05/16/15 1905  TROPONINI 0.03   BNP: Invalid input(s): POCBNP CBG:  Recent Labs  09/29/14 1649  GLUCAP 79  Radiological Exams: Ct Abdomen Pelvis W Contrast  05/24/2015   CLINICAL DATA:  Abdominal pain, nausea and vomiting. No bowel movement since a bilateral inguinal hernia repair 3 days ago.  EXAM: CT ABDOMEN AND PELVIS WITH CONTRAST  TECHNIQUE: Multidetector CT imaging of the abdomen and pelvis was performed using the standard protocol following bolus administration of intravenous contrast.  CONTRAST:  75mL OMNIPAQUE IOHEXOL 300 MG/ML  SOLN  COMPARISON:  05/16/2015.  FINDINGS: Dilated stomach filled with oral contrast and air with little change in degree of dilatation. Interval dilated, gas-filled loops of bowel in the anterior abdomen. These appear to represent a combination of dilated small bowel and sigmoid colon. The remainder of the colon and small bowel are not dilated. No free peritoneal fluid or air.  Interval repair of the previously seen bilateral inguinal hernias with associated postoperative air swelling in both inguinal canals. There is also postoperative air in the anterior abdominal wall at the level of the pelvis bilaterally.  Markedly enlarged, heterogeneous prostate gland is again demonstrated. Anterior bladder wall thickening is also again demonstrated without a focal mass. No hydronephrosis or hydroureter. A large left renal cyst is again demonstrated.  Unremarkable liver, spleen, pancreas, gallbladder and adrenal glands. No enlarged lymph nodes. Atheromatous arterial calcifications. Clear lung bases. Extensive lumbar and lower thoracic spine degenerative changes.  IMPRESSION: 1. Probable postoperative ileus involving central bowel loops. 2.  Chronic gastric distention. 3. Status post surgically repaired bilateral inguinal hernias with expected postoperative changes. 4. Stable markedly enlarged and heterogeneous prostate gland. 5. Diffuse anterior bladder wall thickening, most likely due to chronic bladder outlet obstruction by the enlarged prostate gland.   Electronically Signed   By: Beckie Salts M.D.   On: 05/24/2015 15:36   Dg Chest Port 1 View  05/24/2015   CLINICAL DATA:  81 year old male with abdominal pain, nausea, vomiting. Dark stools x1 week. Tachycardic. Possible sepsis. Initial encounter.  EXAM: PORTABLE CHEST - 1 VIEW  COMPARISON:  05/16/2015 and earlier.  FINDINGS: Portable AP semi upright view at 1739 hrs. Stable cardiomegaly and mediastinal contours. Chronic calcified aortic atherosclerosis. Stable small calcified granulomas and/or calcified pleural plaques in the right lung. Otherwise, allowing for portable technique, the lungs are clear. No pneumothorax, pneumoperitoneum, or pleural effusion identified. Advanced degenerative changes at the shoulders.  IMPRESSION: No acute cardiopulmonary abnormality.   Electronically Signed   By: Odessa Fleming M.D.   On: 05/24/2015 17:47    Assessment/Plan  Generalized weakness Will have him work with physical therapy and occupational therapy team to help with gait training and muscle strengthening exercises.fall precautions. Skin care. Encourage to be out of bed.   Upper GI bleed No ongoing bleed at present. Continue protonix 40 mg bid for now and monitor h&h. Continue to hold aspirin, has gi follow up  Blood loss anemia From gi bleed, monitor h&h  ulcerative esophagitis hold aspirin for now. Avoid any other NSAIDs. Continue protonix 40 mg bid for now and to be seen by GI to assess for need for h.pylori treatment  Protein calorie malnutrition With muscle wasting noted on exam and hypoalbuminemia on lab review. Continue nutritional supplement. Monitor weight  Cognitive impairment Alert  and oriented x 3 this visit. To work with SLP team for cognitive function improvement  Inguinal hernia S/p repair, pain under control, advised to take tramadol 50 mg q6h prn pain, monitor surgical site, healing well at present. Avoid straining exercise  Constipation Stable, continue miralax daily as needed with bisacodyl as needed. Hydration  encouraged  Atrial fibrillation  Stable with rate controlled. Continue toprol xl 25 mg daily. Not on anticoagulation with his age and cognitive impairment, aspirin on hold with recent gi bleed and esophageal ulcer  Hypertension Stable, monitor bp, continue Toprol-XL 25 mg daily   Goals of care: short term rehabilitation   Labs/tests ordered: cbc, bmp  Family/ staff Communication: reviewed care plan with patient and nursing supervisor    Oneal Grout, MD  [facility] [phone] (Monday-Friday 8 am - 5 pm) [phone] (afterhours)

## 2015-06-09 ENCOUNTER — Non-Acute Institutional Stay (SKILLED_NURSING_FACILITY): Payer: Medicare Other | Admitting: Adult Health

## 2015-06-09 ENCOUNTER — Encounter: Payer: Self-pay | Admitting: Adult Health

## 2015-06-09 DIAGNOSIS — D62 Acute posthemorrhagic anemia: Secondary | ICD-10-CM | POA: Diagnosis not present

## 2015-06-09 DIAGNOSIS — K4021 Bilateral inguinal hernia, without obstruction or gangrene, recurrent: Secondary | ICD-10-CM | POA: Diagnosis not present

## 2015-06-09 DIAGNOSIS — Z9889 Other specified postprocedural states: Secondary | ICD-10-CM

## 2015-06-09 DIAGNOSIS — E43 Unspecified severe protein-calorie malnutrition: Secondary | ICD-10-CM

## 2015-06-09 DIAGNOSIS — I10 Essential (primary) hypertension: Secondary | ICD-10-CM

## 2015-06-09 DIAGNOSIS — Z8719 Personal history of other diseases of the digestive system: Secondary | ICD-10-CM

## 2015-06-09 DIAGNOSIS — I482 Chronic atrial fibrillation, unspecified: Secondary | ICD-10-CM

## 2015-06-09 DIAGNOSIS — R531 Weakness: Secondary | ICD-10-CM

## 2015-06-09 DIAGNOSIS — K922 Gastrointestinal hemorrhage, unspecified: Secondary | ICD-10-CM

## 2015-06-10 NOTE — Progress Notes (Signed)
Patient ID: Luis Norton, male   DOB: [birth year], 81 y.o.   MRN: [id number]    DATE:  06/09/15 MRN:  [id number]  BIRTHDAY: 13-Jan-1922  Facility:  Nursing Home Location:  [facility]  Nursing Home Room Number: [id number]  LEVEL OF CARE:  SNF [phone])  Contact Information    Name Relation Home Work Ransom Other [phone]     Liggins,Glenda Niece [phone]  [phone]      Chief Complaint  Patient presents with  . Discharge Note    Generalized weakness, upper GI bleed, atrial fibrillation, inguinal hernia S/P repair, hypertension, protein calorie malnutrition and anemia    HISTORY OF PRESENT ILLNESS:  This is a 81 year old male who is for discharge home with Home health PT, OT, Nursing and ST. DME:  3-in-1 bedside commode. He has been admitted to [facility] on 05/28/15 from [facility]. He has history of cognitive impairment, atrial fibrillation, hypertension, BPH, peptic ulcer disease and hyperlipidemia. He was recently discharged from the hospital on 05/22/15 after a repair of inguinal incarcerated hernias. He has been complaining of melanotic stools. EGD done on 05/26/15 showed severe distal and mid esophageal ulcers with esophagitis. Aspirin was put on hold and was started on Protonix twice a day. Pyloric ulcer biopsy was positive for H. Pylori.  Patient was admitted to this facility for short-term rehabilitation after the patient's recent hospitalization.  Patient has completed SNF rehabilitation and therapy has cleared the patient for discharge.   PAST MEDICAL HISTORY:  Past Medical History  Diagnosis Date  . Hypertension   . Hypercholesterolemia   . ED (erectile dysfunction)   . Urinary frequency     bladder instablity and outlet obstruction   . Constipation   . PUD (peptic ulcer disease)     history of   . BPH (benign prostatic hyperplasia)   . Atrial fibrillation      CURRENT MEDICATIONS: Reviewed  Patient's Medications  New  Prescriptions   No medications on file  Previous Medications   BISACODYL (DULCOLAX) 5 MG EC TABLET    1 tab q hs Prn constipation   METOPROLOL SUCCINATE (TOPROL-XL) 25 MG 24 HR TABLET    Take 1 tablet (25 mg total) by mouth daily.   PANTOPRAZOLE (PROTONIX) 40 MG TABLET    Take 1 tablet (40 mg total) by mouth 2 (two) times daily. Switch for any other PPI at similar dose and frequency   POLYETHYLENE GLYCOL POWDER (GLYCOLAX/MIRALAX) POWDER    Take 255 g by mouth daily as needed.   PROTEIN (PROCEL PO)    Take 1 scoop by mouth 2 (two) times daily.   TRAMADOL (ULTRAM) 50 MG TABLET    Take 0.5-1 tablets (25-50 mg total) by mouth every 6 (six) hours as needed for severe pain.  Modified Medications   No medications on file  Discontinued Medications   No medications on file     Allergies  Allergen Reactions  . Penicillins Swelling     REVIEW OF SYSTEMS:  GENERAL: no change in appetite, no fatigue, no weight changes, no fever, chills or weakness EYES: Denies change in vision, dry eyes, eye pain, itching or discharge EARS: Denies change in hearing, ringing in ears, or earache NOSE: Denies nasal congestion or epistaxis MOUTH and THROAT: Denies oral discomfort, gingival pain or bleeding, pain from teeth or hoarseness   RESPIRATORY: no cough, SOB, DOE, wheezing, hemoptysis CARDIAC: no chest pain, edema or palpitations GI: no diarrhea, constipation, heart burn,  nausea or vomiting GU: Denies dysuria, frequency, hematuria, incontinence, or discharge PSYCHIATRIC: Denies feeling of depression or anxiety. No report of hallucinations, insomnia, paranoia, or agitation    PHYSICAL EXAMINATION  GENERAL APPEARANCE: In no acute distress. Normal body habitus SKIN:  Suprapubic surgical incision is dry, no erythema HEAD: Normal in size and contour. No evidence of trauma EYES: Lids open and close normally. No blepharitis, entropion or ectropion. PERRL. Conjunctivae are clear and sclerae are white. Lenses  are without opacity EARS: Pinnae are normal. Patient hears normal voice tunes of the examiner MOUTH and THROAT: Lips are without lesions. Oral mucosa is moist and without lesions. Tongue is normal in shape, size, and color and without lesions NECK: supple, trachea midline, no neck masses, no thyroid tenderness, no thyromegaly LYMPHATICS: no LAN in the neck, no supraclavicular LAN RESPIRATORY: breathing is even & unlabored, BS CTAB CARDIAC: RRR, no murmur,no extra heart sounds, no edema GI: abdomen soft, normal BS, no masses, no hepatomegaly, no splenomegaly EXTREMITIES:  Able to move 4 extremities PSYCHIATRIC: Alert and oriented X 3. Affect and behavior are appropriate  LABS/RADIOLOGY: Labs reviewed: Basic Metabolic Panel:  Recent Labs  16/10/96 2103  05/25/15 0238 05/26/15 0555 05/27/15 0525  NA  --   < > 137 140 135  K  --   < > 4.7 4.3 4.1  CL  --   < > 102 108 103  CO2  --   < > GLUCOSE  --   < > 96 75 84  BUN  --   < > 12 <5* 5*  CREATININE  --   < > 0.86 0.89 0.98  CALCIUM  --   < > 8.4* 8.6* 8.2*  MG 1.9  --   --   --   --   < > = values in this interval not displayed. Liver Function Tests:  Recent Labs  05/24/15 1135 05/24/15 1845 05/26/15 0555  AST 36 23 21  ALT ALKPHOS 106 91 79  BILITOT 0.6 0.7 0.6  PROT 7.2 5.9* 5.6*  ALBUMIN 3.1* 2.6* 2.3*    Recent Labs  08/15/14 1730 09/29/14 1325 12/02/14 1435  LIPASE 42 31 35   CBC:  Recent Labs  12/02/14 1435  05/17/15 1406  05/24/15 1845 05/25/15 0238 05/26/15 0555 05/27/15 0525  WBC 9.0  < > 8.9  < > 17.2* 14.3* 8.4 7.5  NEUTROABS 7.2  --  6.1  --  14.5*  --   --   --   HGB 13.8  < > 12.5*  < > 10.5* 8.9* 10.0* 9.0*  HCT 41.6  < > 37.6*  < > 32.0* 26.9* 30.1* 27.6*  MCV 83.9  < > 83.0  < > 82.1 83.0 82.2 82.6  PLT 279  < > 391  < > 434* 431* 413* 419*  < > = values in this interval not displayed.  Cardiac Enzymes:  Recent Labs  05/16/15 1905  TROPONINI 0.03    CBG:  Recent Labs  09/29/14 1649  GLUCAP 79     Dg Chest 2 View  05/16/2015   CLINICAL DATA:  Abdominal pain 4 days. Nausea and vomiting with constipation.  EXAM: CHEST  2 VIEW  COMPARISON:  03/18/2013 and 03/10/2013  FINDINGS: Lungs are hyperexpanded with flattening of the hemidiaphragms. There is no focal consolidation or effusion. There is borderline cardiomegaly. Calcified plaque is present over the thoracic aorta. Small nodular cluster over the lateral right midlung  unchanged likely calcified granulomas. Remainder of the exam is unchanged.  IMPRESSION: No acute cardiopulmonary disease.  COPD.   Electronically Signed   By: Elberta Fortis M.D.   On: 05/16/2015 16:17   Ct Abdomen Pelvis W Contrast  05/24/2015   CLINICAL DATA:  Abdominal pain, nausea and vomiting. No bowel movement since a bilateral inguinal hernia repair 3 days ago.  EXAM: CT ABDOMEN AND PELVIS WITH CONTRAST  TECHNIQUE: Multidetector CT imaging of the abdomen and pelvis was performed using the standard protocol following bolus administration of intravenous contrast.  CONTRAST:  75mL OMNIPAQUE IOHEXOL 300 MG/ML  SOLN  COMPARISON:  05/16/2015.  FINDINGS: Dilated stomach filled with oral contrast and air with little change in degree of dilatation. Interval dilated, gas-filled loops of bowel in the anterior abdomen. These appear to represent a combination of dilated small bowel and sigmoid colon. The remainder of the colon and small bowel are not dilated. No free peritoneal fluid or air.  Interval repair of the previously seen bilateral inguinal hernias with associated postoperative air swelling in both inguinal canals. There is also postoperative air in the anterior abdominal wall at the level of the pelvis bilaterally.  Markedly enlarged, heterogeneous prostate gland is again demonstrated. Anterior bladder wall thickening is also again demonstrated without a focal mass. No hydronephrosis or hydroureter. A large left renal cyst is  again demonstrated.  Unremarkable liver, spleen, pancreas, gallbladder and adrenal glands. No enlarged lymph nodes. Atheromatous arterial calcifications. Clear lung bases. Extensive lumbar and lower thoracic spine degenerative changes.  IMPRESSION: 1. Probable postoperative ileus involving central bowel loops. 2. Chronic gastric distention. 3. Status post surgically repaired bilateral inguinal hernias with expected postoperative changes. 4. Stable markedly enlarged and heterogeneous prostate gland. 5. Diffuse anterior bladder wall thickening, most likely due to chronic bladder outlet obstruction by the enlarged prostate gland.   Electronically Signed   By: Beckie Salts M.D.   On: 05/24/2015 15:36   Ct Abdomen Pelvis W Contrast  05/16/2015   CLINICAL DATA:  Severe lower abdominal pain for 4 days, constipation, history hypertension, former smoker, peptic ulcer disease  EXAM: CT ABDOMEN AND PELVIS WITH CONTRAST  TECHNIQUE: Multidetector CT imaging of the abdomen and pelvis was performed using the standard protocol following bolus administration of intravenous contrast. Sagittal and coronal MPR images reconstructed from axial data set.  CONTRAST:  OMNIPAQUE IOHEXOL 300 MG/ML SOLN IV. Dilute oral contrast.  COMPARISON:  09/29/2014  FINDINGS: Lung bases emphysematous but clear.  Large cyst arising from anterior aspect of mid LEFT kidney 7.5 x 5.4 x 7.1 cm.  Liver, spleen, pancreas, kidneys, and adrenal glands otherwise normal.  Stomach distended by contrast and food debris.  Appendix, cecum, and terminal ileum located within a large RIGHT inguinal hernia.  Numerous small bowel loops within a large LEFT inguinal hernia.  Bowel loops otherwise unremarkable without evidence of bowel obstruction.  Extensive atherosclerotic calcifications.  Marked prostatic enlargement, gland 6.5 x 4.9 x 6.3 cm.  No mass, adenopathy, free air or free fluid.  Osseous demineralization with degenerative disc and facet disease changes of  the lumbar spine.  IMPRESSION: Large LEFT renal cyst.  Small bowel loops within large LEFT inguinal hernia without obstruction.  Appendix, cecum and terminal ileum within large RIGHT inguinal hernia without obstruction.  Marked prostatic enlargement.  Extensive atherosclerotic disease.   Electronically Signed   By: Ulyses Southward M.D.   On: 05/16/2015 17:19   Dg Chest Port 1 View  05/24/2015   CLINICAL DATA:  81 year old male with abdominal pain, nausea, vomiting. Dark stools x1 week. Tachycardic. Possible sepsis. Initial encounter.  EXAM: PORTABLE CHEST - 1 VIEW  COMPARISON:  05/16/2015 and earlier.  FINDINGS: Portable AP semi upright view at 1739 hrs. Stable cardiomegaly and mediastinal contours. Chronic calcified aortic atherosclerosis. Stable small calcified granulomas and/or calcified pleural plaques in the right lung. Otherwise, allowing for portable technique, the lungs are clear. No pneumothorax, pneumoperitoneum, or pleural effusion identified. Advanced degenerative changes at the shoulders.  IMPRESSION: No acute cardiopulmonary abnormality.   Electronically Signed   By: Odessa Fleming M.D.   On: 05/24/2015 17:47   Dg Abd 2 Views  05/17/2015   CLINICAL DATA:  Inguinal hernia, mid abdominal pain.  EXAM: ABDOMEN - 2 VIEW  COMPARISON:  CT 05/16/2015  FINDINGS: Oral contrast material seen within the colon. Mild diffuse gaseous distention of bowel. No evidence of bowel obstruction. No free air. No organomegaly.  IMPRESSION: Mild diffuse gaseous distention of bowel, likely mild ileus. No evidence of bowel obstruction or free air.   Electronically Signed   By: Charlett Nose M.D.   On: 05/17/2015 12:06    ASSESSMENT/PLAN:  Generalized weakness - for home health PT, OT, Nursing and ST  Upper GI bleed/ulcerative esophagitis - hold aspirin;  was positive for H. pylori; follow-up with Dr. Ewing Schlein, gastroenterologists; continue Protonix 40 mg twice a day  Atrial fibrillation - rate controlled; aspirin on hold; continue  Toprol-XL 25 mg by mouth daily; not anticoagulation due to fall risk  Inguinal hernia S/P repair - pain is well controlled; continue tramadol 5 mg 1/2-1 tab by mouth every 6 hours when necessary  Hypertension - well-controlled; continue Toprol-XL 25 mg 1 tab by mouth daily  Protein calorie malnutrition, severe - albumin 2.3; continue supplementation; awaiting BMP result  Anemia, acute blood loss - hemoglobin 9.0; awaiting CBC result     I have filled out patient's discharge paperwork and written prescriptions.  Patient will receive home health PT, OT, ST and Nursing.  DME provided:  3 in 1 bedside commode  Total discharge time: Greater than 30 minutes  Discharge time involved coordination of the discharge process with social worker, nursing staff and therapy department. Medical justification for home health services/DME verified.    [facility], NP [employer] [phone]

## 2015-09-28 ENCOUNTER — Encounter (HOSPITAL_COMMUNITY): Payer: Self-pay | Admitting: *Deleted

## 2015-09-28 ENCOUNTER — Emergency Department (HOSPITAL_COMMUNITY)
Admission: EM | Admit: 2015-09-28 | Discharge: 2015-09-28 | Discharge status: Against Medical Advice | Payer: Commercial Managed Care - HMO | Source: Home / Self Care

## 2015-09-28 DIAGNOSIS — I10 Essential (primary) hypertension: Secondary | ICD-10-CM | POA: Insufficient documentation

## 2015-09-28 DIAGNOSIS — R109 Unspecified abdominal pain: Secondary | ICD-10-CM

## 2015-09-28 LAB — COMPREHENSIVE METABOLIC PANEL
ALK PHOS: 140 U/L — AB (ref 38–126)
ALT: 9 U/L — ABNORMAL LOW (ref 17–63)
ANION GAP: 12 (ref 5–15)
AST: 18 U/L (ref 15–41)
Albumin: 3.8 g/dL (ref 3.5–5.0)
BILIRUBIN TOTAL: 0.5 mg/dL (ref 0.3–1.2)
BUN: 21 mg/dL — ABNORMAL HIGH (ref 6–20)
CALCIUM: 9.3 mg/dL (ref 8.9–10.3)
CO2: 28 mmol/L (ref 22–32)
Chloride: 94 mmol/L — ABNORMAL LOW (ref 101–111)
Creatinine, Ser: 1.38 mg/dL — ABNORMAL HIGH (ref 0.61–1.24)
GFR calc non Af Amer: 43 mL/min — ABNORMAL LOW (ref >60)
GFR, EST AFRICAN AMERICAN: 50 mL/min — AB (ref >60)
Glucose, Bld: 101 mg/dL — ABNORMAL HIGH (ref 65–99)
POTASSIUM: 3.9 mmol/L (ref 3.5–5.1)
SODIUM: 134 mmol/L — AB (ref 135–145)
TOTAL PROTEIN: 7.9 g/dL (ref 6.5–8.1)

## 2015-09-28 LAB — CBC
HEMATOCRIT: 40.7 % (ref 39.0–52.0)
HEMOGLOBIN: 12.6 g/dL — AB (ref 13.0–17.0)
MCH: 24.2 pg — ABNORMAL LOW (ref 26.0–34.0)
MCHC: 31 g/dL (ref 30.0–36.0)
MCV: 78.1 fL (ref 78.0–100.0)
Platelets: 331 10*3/uL (ref 150–400)
RBC: 5.21 MIL/uL (ref 4.22–5.81)
RDW: 16.5 % — ABNORMAL HIGH (ref 11.5–15.5)
WBC: 7.9 10*3/uL (ref 4.0–10.5)

## 2015-09-28 LAB — LIPASE, BLOOD: Lipase: 41 U/L (ref 11–51)

## 2015-09-28 NOTE — ED Notes (Signed)
Pt reports mid abd pain that started this afternoon. Denies n/v/d. Reports being seen last month for same.

## 2015-09-29 ENCOUNTER — Encounter (HOSPITAL_COMMUNITY): Payer: Self-pay | Admitting: Emergency Medicine

## 2015-09-29 ENCOUNTER — Inpatient Hospital Stay (HOSPITAL_COMMUNITY)
Admission: EM | Admit: 2015-09-29 | Discharge: 2015-10-03 | DRG: 392 | Disposition: A | Discharge status: Home / Self Care | Payer: Commercial Managed Care - HMO | Attending: Internal Medicine | Admitting: Internal Medicine

## 2015-09-29 ENCOUNTER — Emergency Department (INDEPENDENT_AMBULATORY_CARE_PROVIDER_SITE_OTHER)
Admission: EM | Admit: 2015-09-29 | Discharge: 2015-09-29 | Disposition: A | Discharge status: Nursing Home (Includes ICF) | Payer: Commercial Managed Care - HMO | Source: Home / Self Care | Attending: Emergency Medicine | Admitting: Emergency Medicine

## 2015-09-29 ENCOUNTER — Emergency Department (HOSPITAL_COMMUNITY): Payer: Commercial Managed Care - HMO

## 2015-09-29 DIAGNOSIS — E7800 Pure hypercholesterolemia, unspecified: Secondary | ICD-10-CM | POA: Diagnosis present

## 2015-09-29 DIAGNOSIS — I482 Chronic atrial fibrillation, unspecified: Secondary | ICD-10-CM

## 2015-09-29 DIAGNOSIS — F0390 Unspecified dementia without behavioral disturbance: Secondary | ICD-10-CM | POA: Diagnosis present

## 2015-09-29 DIAGNOSIS — R350 Frequency of micturition: Secondary | ICD-10-CM | POA: Diagnosis present

## 2015-09-29 DIAGNOSIS — R1084 Generalized abdominal pain: Secondary | ICD-10-CM | POA: Diagnosis not present

## 2015-09-29 DIAGNOSIS — N138 Other obstructive and reflux uropathy: Secondary | ICD-10-CM | POA: Diagnosis present

## 2015-09-29 DIAGNOSIS — N179 Acute kidney failure, unspecified: Secondary | ICD-10-CM | POA: Diagnosis present

## 2015-09-29 DIAGNOSIS — Z87891 Personal history of nicotine dependence: Secondary | ICD-10-CM

## 2015-09-29 DIAGNOSIS — R197 Diarrhea, unspecified: Secondary | ICD-10-CM

## 2015-09-29 DIAGNOSIS — I10 Essential (primary) hypertension: Secondary | ICD-10-CM | POA: Diagnosis present

## 2015-09-29 DIAGNOSIS — N401 Enlarged prostate with lower urinary tract symptoms: Secondary | ICD-10-CM | POA: Diagnosis present

## 2015-09-29 DIAGNOSIS — K529 Noninfective gastroenteritis and colitis, unspecified: Secondary | ICD-10-CM | POA: Diagnosis not present

## 2015-09-29 DIAGNOSIS — R1110 Vomiting, unspecified: Secondary | ICD-10-CM

## 2015-09-29 DIAGNOSIS — Z66 Do not resuscitate: Secondary | ICD-10-CM | POA: Diagnosis present

## 2015-09-29 DIAGNOSIS — I480 Paroxysmal atrial fibrillation: Secondary | ICD-10-CM | POA: Diagnosis present

## 2015-09-29 DIAGNOSIS — E860 Dehydration: Secondary | ICD-10-CM | POA: Diagnosis present

## 2015-09-29 DIAGNOSIS — Z23 Encounter for immunization: Secondary | ICD-10-CM

## 2015-09-29 LAB — COMPREHENSIVE METABOLIC PANEL
ALBUMIN: 3.6 g/dL (ref 3.5–5.0)
ALK PHOS: 134 U/L — AB (ref 38–126)
ALT: 9 U/L — ABNORMAL LOW (ref 17–63)
AST: 18 U/L (ref 15–41)
Anion gap: 10 (ref 5–15)
BILIRUBIN TOTAL: 0.4 mg/dL (ref 0.3–1.2)
BUN: 16 mg/dL (ref 6–20)
CALCIUM: 10 mg/dL (ref 8.9–10.3)
CO2: 31 mmol/L (ref 22–32)
Chloride: 96 mmol/L — ABNORMAL LOW (ref 101–111)
Creatinine, Ser: 1.38 mg/dL — ABNORMAL HIGH (ref 0.61–1.24)
GFR calc Af Amer: 50 mL/min — ABNORMAL LOW (ref >60)
GFR calc non Af Amer: 43 mL/min — ABNORMAL LOW (ref >60)
GLUCOSE: 100 mg/dL — AB (ref 65–99)
Potassium: 4.4 mmol/L (ref 3.5–5.1)
Sodium: 137 mmol/L (ref 135–145)
TOTAL PROTEIN: 7.6 g/dL (ref 6.5–8.1)

## 2015-09-29 LAB — URINE MICROSCOPIC-ADD ON
RBC / HPF: NONE SEEN RBC/hpf (ref 0–5)
SQUAMOUS EPITHELIAL / LPF: NONE SEEN

## 2015-09-29 LAB — URINALYSIS, ROUTINE W REFLEX MICROSCOPIC
BILIRUBIN URINE: NEGATIVE
Glucose, UA: NEGATIVE mg/dL
Hgb urine dipstick: NEGATIVE
KETONES UR: NEGATIVE mg/dL
NITRITE: NEGATIVE
PH: 5.5 (ref 5.0–8.0)
Protein, ur: 100 mg/dL — AB
Specific Gravity, Urine: 1.019 (ref 1.005–1.030)

## 2015-09-29 LAB — LIPASE, BLOOD: Lipase: 31 U/L (ref 11–51)

## 2015-09-29 LAB — CBC
HCT: 38.7 % — ABNORMAL LOW (ref 39.0–52.0)
Hemoglobin: 12.3 g/dL — ABNORMAL LOW (ref 13.0–17.0)
MCH: 24.9 pg — ABNORMAL LOW (ref 26.0–34.0)
MCHC: 31.8 g/dL (ref 30.0–36.0)
MCV: 78.5 fL (ref 78.0–100.0)
Platelets: 323 10*3/uL (ref 150–400)
RBC: 4.93 MIL/uL (ref 4.22–5.81)
RDW: 16.3 % — AB (ref 11.5–15.5)
WBC: 8.9 10*3/uL (ref 4.0–10.5)

## 2015-09-29 MED ORDER — MORPHINE SULFATE (PF) 4 MG/ML IV SOLN
4.0000 mg | Freq: Once | INTRAVENOUS | Status: AC
  Administered 2015-09-29: 4 mg via INTRAVENOUS
  Filled 2015-09-29: qty 1

## 2015-09-29 MED ORDER — IOHEXOL 300 MG/ML  SOLN
75.0000 mL | Freq: Once | INTRAMUSCULAR | Status: AC | PRN
  Administered 2015-09-29: 75 mL via INTRAVENOUS

## 2015-09-29 MED ORDER — SODIUM CHLORIDE 0.9 % IV BOLUS (SEPSIS)
500.0000 mL | Freq: Once | INTRAVENOUS | Status: AC
  Administered 2015-09-29: 500 mL via INTRAVENOUS

## 2015-09-29 MED ORDER — SIMETHICONE 40 MG/0.6ML PO SUSP (UNIT DOSE)
40.0000 mg | Freq: Once | ORAL | Status: AC
  Administered 2015-09-29: 40 mg via ORAL
  Filled 2015-09-29 (×2): qty 0.6

## 2015-09-29 MED ORDER — METRONIDAZOLE IN NACL 5-0.79 MG/ML-% IV SOLN
500.0000 mg | Freq: Once | INTRAVENOUS | Status: AC
  Administered 2015-09-29: 500 mg via INTRAVENOUS
  Filled 2015-09-29: qty 100

## 2015-09-29 MED ORDER — CIPROFLOXACIN IN D5W 400 MG/200ML IV SOLN
400.0000 mg | Freq: Once | INTRAVENOUS | Status: AC
  Administered 2015-09-30: 400 mg via INTRAVENOUS
  Filled 2015-09-29: qty 200

## 2015-09-29 MED ORDER — FENTANYL CITRATE (PF) 100 MCG/2ML IJ SOLN
50.0000 ug | Freq: Once | INTRAMUSCULAR | Status: AC
  Administered 2015-09-29: 50 ug via INTRAVENOUS
  Filled 2015-09-29: qty 2

## 2015-09-29 NOTE — ED Notes (Addendum)
Pt complains of a stabbing and burning pain in the mid upper part of his abdomen.  His stepdaughter reports hernia repair back in October and reports vomiting after surgery from reflux.  Pt reports vomiting last night 2-3 times.  The stepdaughter states he is probably out of his anti nausea and pain medication.  Upon review of his chart, he had his hernia repair in August.

## 2015-09-29 NOTE — ED Provider Notes (Signed)
CSN: [id number]     Arrival date & time 09/29/15  1307 History   First MD Initiated Contact with Patient 09/29/15 1407     Chief Complaint  Patient presents with  . Abdominal Pain   (Consider location/radiation/quality/duration/timing/severity/associated sxs/prior Treatment) HPI History obtained from patient:   LOCATION:abdomen SEVERITY:8 DURATION:since last night CONTEXT:sudden onset of abdominal pain, went to ER last night but had to wait, so left, and is here in UC now. Still complains of very bad harsh pain. Hernia repair a couple of months ago.  QUALITY:harsh, hurts bad MODIFYING FACTORS:none ASSOCIATED SYMPTOMS:worsening pain TIMING:constant    Past Medical History  Diagnosis Date  . Hypertension   . Hypercholesterolemia   . ED (erectile dysfunction)   . Urinary frequency     bladder instablity and outlet obstruction   . Constipation   . PUD (peptic ulcer disease)     history of   . BPH (benign prostatic hyperplasia)   . Atrial fibrillation [facility](HCC)    Past Surgical History  Procedure Laterality Date  . Ankle surgery Right   . Inguinal hernia repair Bilateral 05/20/2015    Procedure: HERNIA REPAIR INGUINAL ADULT BILATERAL;  Surgeon: Abigail Miyamotoouglas Blackman, MD;  Location: [facility] OR;  Service: General;  Laterality: Bilateral;  . Insertion of mesh Bilateral 05/20/2015    Procedure: INSERTION OF MESH;  Surgeon: Abigail Miyamotoouglas Blackman, MD;  [facility];  Service: General;  Laterality: Bilateral;  . Esophagogastroduodenoscopy N/A 05/26/2015    Procedure: ESOPHAGOGASTRODUODENOSCOPY (EGD);  Surgeon: Vida RiggerMarc Magod, MD;  [facility];  Service: Endoscopy;  Laterality: N/A;   History reviewed. No pertinent family history. Social History  Substance Use Topics  . Smoking status: Former Games developermoker  . Smokeless tobacco: None  . Alcohol Use: No    Review of Systems ROS +'ve abdominal pain  Denies: HEADACHE, NAUSEA,   CHEST PAIN, CONGESTION, DYSURIA, SHORTNESS OF BREATH  Allergies   Penicillins  Home Medications   Prior to Admission medications   Medication Sig Start Date End Date Taking? Authorizing Provider  bisacodyl (DULCOLAX) 5 MG EC tablet 1 tab q hs Prn constipation Patient taking differently: Take 5 mg by mouth at bedtime as needed for mild constipation.  09/23/13   Hayden Rasmussenavid Mabe, NP  metoprolol succinate (TOPROL-XL) 25 MG 24 hr tablet Take 1 tablet (25 mg total) by mouth daily. Patient not taking: Reported on 05/24/2015 05/22/15   Clydia LlanoMutaz Elmahi, MD  pantoprazole (PROTONIX) 40 MG tablet Take 1 tablet (40 mg total) by mouth 2 (two) times daily. Switch for any other PPI at similar dose and frequency 05/28/15   Leroy SeaPrashant K Singh, MD  polyethylene glycol powder (GLYCOLAX/MIRALAX) powder Take 255 g by mouth daily as needed. 05/28/15   Leroy SeaPrashant K Singh, MD  Protein (PROCEL PO) Take 1 scoop by mouth 2 (two) times daily.    Historical Provider, MD  traMADol (ULTRAM) 50 MG tablet Take 0.5-1 tablets (25-50 mg total) by mouth every 6 (six) hours as needed for severe pain. 05/22/15   Clydia LlanoMutaz Elmahi, MD   Meds Ordered and Administered this Visit  Medications - No data to display  BP 159/99 mmHg  Pulse 91  Temp(Src) 98.1 F (36.7 C) (Oral)  Resp 14  SpO2 94% No data found.   Physical Exam  Constitutional: He appears well-developed and well-nourished.  HENT:  Head: Normocephalic and atraumatic.  Pulmonary/Chest: Effort normal.  Abdominal: There is tenderness. There is guarding. There is no rebound.  Musculoskeletal: Normal range of motion.  Neurological: He is alert.  Skin: Skin is warm and dry.  Psychiatric: He has a normal mood and affect. His behavior is normal. Judgment and thought content normal.  Nursing note and vitals reviewed.   ED Course  Procedures (including critical care time)  Labs Review Labs Reviewed - No data to display  Imaging Review No results found.   Visual Acuity Review  Right Eye Distance:   Left Eye Distance:   Bilateral Distance:     Right Eye Near:   Left Eye Near:    Bilateral Near:         MDM   1. Generalized abdominal pain     Patient is advised to attend ED for further evaluation of abdominal pain. I have explained to patient that he complaint has many dangerous possible diagnosis that need to be evaluated in the proper setting.  Transfer to ER via shuttle.     Tharon Aquas, PA 09/29/15 [phone]

## 2015-09-29 NOTE — ED Provider Notes (Signed)
CSN: [id number]     Arrival date & time 09/29/15  1445 History   First MD Initiated Contact with Patient 09/29/15 1903     Chief Complaint  Patient presents with  . Abdominal Pain     (Consider location/radiation/quality/duration/timing/severity/associated sxs/prior Treatment) HPI Patient presents with central abdominal pain starting last evening. Patient states he's had multiple episodes of vomiting as well as several loose stools. No blood in either. No fever or chills. Patient took laxatives before symptoms started. Was seen at urgent care and referred to the emergency department for further workup. Patient denies any chest pain. Patient has known history of atrial fibrillation without any anticoagulation. Past Medical History  Diagnosis Date  . Hypertension   . Hypercholesterolemia   . ED (erectile dysfunction)   . Urinary frequency     bladder instablity and outlet obstruction   . Constipation   . PUD (peptic ulcer disease)     history of   . BPH (benign prostatic hyperplasia)   . Atrial fibrillation [facility])    Past Surgical History  Procedure Laterality Date  . Ankle surgery Right   . Inguinal hernia repair Bilateral 05/20/2015    Procedure: HERNIA REPAIR INGUINAL ADULT BILATERAL;  Surgeon: Abigail Miyamoto, MD;  Location: [facility] OR;  Service: General;  Laterality: Bilateral;  . Insertion of mesh Bilateral 05/20/2015    Procedure: INSERTION OF MESH;  Surgeon: Abigail Miyamoto, MD;  Location: [facility] OR;  Service: General;  Laterality: Bilateral;  . Esophagogastroduodenoscopy N/A 05/26/2015    Procedure: ESOPHAGOGASTRODUODENOSCOPY (EGD);  Surgeon: Vida Rigger, MD;  Location: [facility] ENDOSCOPY;  Service: Endoscopy;  Laterality: N/A;   History reviewed. No pertinent family history. Social History  Substance Use Topics  . Smoking status: Former Games developer  . Smokeless tobacco: None  . Alcohol Use: No    Review of Systems  Constitutional: Negative for fever and chills.  Respiratory: Negative for  shortness of breath.   Cardiovascular: Negative for chest pain.  Gastrointestinal: Positive for nausea, vomiting, abdominal pain and diarrhea. Negative for constipation and blood in stool.  Musculoskeletal: Negative for back pain, neck pain and neck stiffness.  Skin: Negative for rash and wound.  Neurological: Negative for dizziness, weakness, light-headedness, numbness and headaches.  All other systems reviewed and are negative.     Allergies  Penicillins  Home Medications   Prior to Admission medications   Medication Sig Start Date End Date Taking? Authorizing Provider  bisacodyl (DULCOLAX) 5 MG EC tablet 1 tab q hs Prn constipation Patient not taking: Reported on 09/29/2015 09/23/13   Hayden Rasmussen, NP  metoprolol succinate (TOPROL-XL) 25 MG 24 hr tablet Take 1 tablet (25 mg total) by mouth daily. Patient not taking: Reported on 05/24/2015 05/22/15   Clydia Llano, MD  pantoprazole (PROTONIX) 40 MG tablet Take 1 tablet (40 mg total) by mouth 2 (two) times daily. Switch for any other PPI at similar dose and frequency Patient not taking: Reported on 09/29/2015 05/28/15   Leroy Sea, MD  polyethylene glycol powder (GLYCOLAX/MIRALAX) powder Take 255 g by mouth daily as needed. Patient not taking: Reported on 09/29/2015 05/28/15   Leroy Sea, MD  traMADol (ULTRAM) 50 MG tablet Take 0.5-1 tablets (25-50 mg total) by mouth every 6 (six) hours as needed for severe pain. Patient not taking: Reported on 09/29/2015 05/22/15   Clydia Llano, MD   BP 160/89 mmHg  Pulse 65  Temp(Src) 97.8 F (36.6 C) (Oral)  Resp 14  SpO2 97% Physical Exam  Constitutional: He  is oriented to person, place, and time. He appears well-developed and well-nourished. No distress.  HENT:  Head: Normocephalic and atraumatic.  Mouth/Throat: Oropharynx is clear and moist.  Eyes: EOM are normal. Pupils are equal, round, and reactive to light.  Neck: Normal range of motion. Neck supple.  Cardiovascular: Regular rhythm.    Irregularly irregular  Pulmonary/Chest: Effort normal and breath sounds normal. No respiratory distress. He has no wheezes. He has no rales.  Abdominal: Soft. He exhibits no distension. There is tenderness (Maximal tenderness to palpation just superior to the umbilicus. Voluntary guarding. Patient does have generalized tenderness). There is guarding. There is no rebound.  Hyperactive bowel sounds present  Musculoskeletal: Normal range of motion. He exhibits no edema or tenderness.  No CVA tenderness bilaterally.  Neurological: He is alert and oriented to person, place, and time.  Skin: Skin is warm and dry. No rash noted. No erythema.  Psychiatric: He has a normal mood and affect. His behavior is normal.  Nursing note and vitals reviewed.   ED Course  Procedures (including critical care time) Labs Review Labs Reviewed  COMPREHENSIVE METABOLIC PANEL - Abnormal; Notable for the following:    Chloride 96 (*)    Glucose, Bld 100 (*)    Creatinine, Ser 1.38 (*)    ALT 9 (*)    Alkaline Phosphatase 134 (*)    GFR calc non Af Amer 43 (*)    GFR calc Af Amer 50 (*)    All other components within normal limits  CBC - Abnormal; Notable for the following:    Hemoglobin 12.3 (*)    HCT 38.7 (*)    MCH 24.9 (*)    RDW 16.3 (*)    All other components within normal limits  URINALYSIS, ROUTINE W REFLEX MICROSCOPIC (NOT AT [facility]) - Abnormal; Notable for the following:    APPearance HAZY (*)    Protein, ur 100 (*)    Leukocytes, UA SMALL (*)    All other components within normal limits  URINE MICROSCOPIC-ADD ON - Abnormal; Notable for the following:    Bacteria, UA MANY (*)    Casts HYALINE CASTS (*)    All other components within normal limits  GASTROINTESTINAL PANEL BY PCR, STOOL (REPLACES STOOL CULTURE)  LIPASE, BLOOD    Imaging Review Ct Abdomen Pelvis W Contrast  09/29/2015  CLINICAL DATA:  82 year old male with umbilical pain and nausea, vomiting and diarrhea, onset yesterday.  EXAM: CT ABDOMEN AND PELVIS WITH CONTRAST TECHNIQUE: Multidetector CT imaging of the abdomen and pelvis was performed using the standard protocol following bolus administration of intravenous contrast. CONTRAST:  75mL OMNIPAQUE IOHEXOL 300 MG/ML  SOLN COMPARISON:  CT 05/24/2015 FINDINGS: Lower chest: No consolidation. Cardiomegaly is unchanged. Coronary artery calcifications are seen. Calcified granuloma at the right lung base. Liver: No focal lesion Hepatobiliary: Gallbladder physiologically distended, no calcified stone. No biliary dilatation. Pancreas: No ductal dilatation or inflammation. Spleen: Normal. Adrenal glands: No nodule. Kidneys: Symmetric renal enhancement. No hydronephrosis. Large left renal cyst measuring at least 7.8 cm is again seen. Questionable punctate nonobstructing left nephrolithiasis. Stomach/Bowel: Stomach physiologically distended with ingested contents. Lack of enteric contrast limits bowel evaluation. Fluid-filled normal caliber small bowel throughout the abdomen. Liquid stool suspected in the colon with colonic wall thickening involving the ascending and descending colon. Mild gaseous distention of transverse colon which appears tortuous. Appendix tentatively identified and normal. Vascular/Lymphatic: No retroperitoneal adenopathy. Abdominal aorta is normal in caliber. Atherosclerosis and tortuosity of the abdominal aorta. 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 Reproductive: Heterogeneous enlarged prostate gland causing mass effect on the bladder base. Bladder: Physiologically distended, mild chronic wall thickening. Other: No free air, free fluid, or intra-abdominal fluid collection. Resolution of previous inguinal edema post hernia repair. No recurrent hernia. Musculoskeletal: There are no acute or suspicious osseous abnormalities. Scoliosis and degenerative change throughout spine. IMPRESSION: 1. Bowel wall thickening of  the ascending and descending colon, with liquid stool suspected, most consistent with diarrheal illness, mild colitis. No small bowel dilatation. 2. Stable chronic findings as described. Electronically Signed   By: Rubye OaksMelanie  Ehinger M.D.   On: 09/29/2015 21:43   I have personally reviewed and evaluated these images and lab results as part of my medical decision-making.   EKG Interpretation None      MDM   Final diagnoses:  Colitis  Vomiting and diarrhea     History with persistent pain despite IV medication. CT with evidence of colitis. Discussed with Dr. Toniann FailKakrakandy will admit to MedSurg bed.   Loren Raceravid Advait Buice, MD 09/29/15 2221

## 2015-09-29 NOTE — ED Notes (Signed)
Pt transported to CT ?

## 2015-09-29 NOTE — Progress Notes (Signed)
Received report from ED nurse, Barnie DelEmelie.

## 2015-09-29 NOTE — ED Notes (Signed)
First RN in the ED, Amy was at lunch so I called report to the ED Charge RN, [place].  Pt will be taken to the ED via shuttle for further evaluation for his mid upper abdominal pain.

## 2015-09-29 NOTE — ED Notes (Signed)
Pt here for upper abd pain; per family pt may be out of GERD meds; pt sts pain through to back and started last night; [facility]

## 2015-09-30 ENCOUNTER — Encounter (HOSPITAL_COMMUNITY): Payer: Self-pay

## 2015-09-30 DIAGNOSIS — E7800 Pure hypercholesterolemia, unspecified: Secondary | ICD-10-CM | POA: Diagnosis not present

## 2015-09-30 DIAGNOSIS — E860 Dehydration: Secondary | ICD-10-CM | POA: Diagnosis not present

## 2015-09-30 DIAGNOSIS — Z87891 Personal history of nicotine dependence: Secondary | ICD-10-CM | POA: Diagnosis not present

## 2015-09-30 DIAGNOSIS — N179 Acute kidney failure, unspecified: Secondary | ICD-10-CM | POA: Diagnosis not present

## 2015-09-30 DIAGNOSIS — F0390 Unspecified dementia without behavioral disturbance: Secondary | ICD-10-CM | POA: Diagnosis present

## 2015-09-30 DIAGNOSIS — I480 Paroxysmal atrial fibrillation: Secondary | ICD-10-CM | POA: Diagnosis not present

## 2015-09-30 DIAGNOSIS — K529 Noninfective gastroenteritis and colitis, unspecified: Principal | ICD-10-CM

## 2015-09-30 DIAGNOSIS — R197 Diarrhea, unspecified: Secondary | ICD-10-CM

## 2015-09-30 DIAGNOSIS — R1110 Vomiting, unspecified: Secondary | ICD-10-CM | POA: Diagnosis not present

## 2015-09-30 DIAGNOSIS — N401 Enlarged prostate with lower urinary tract symptoms: Secondary | ICD-10-CM | POA: Diagnosis not present

## 2015-09-30 DIAGNOSIS — N138 Other obstructive and reflux uropathy: Secondary | ICD-10-CM | POA: Diagnosis not present

## 2015-09-30 DIAGNOSIS — Z66 Do not resuscitate: Secondary | ICD-10-CM | POA: Diagnosis not present

## 2015-09-30 DIAGNOSIS — Z23 Encounter for immunization: Secondary | ICD-10-CM | POA: Diagnosis not present

## 2015-09-30 DIAGNOSIS — I10 Essential (primary) hypertension: Secondary | ICD-10-CM | POA: Diagnosis not present

## 2015-09-30 DIAGNOSIS — R350 Frequency of micturition: Secondary | ICD-10-CM | POA: Diagnosis not present

## 2015-09-30 LAB — CBC WITH DIFFERENTIAL/PLATELET
BASOS PCT: 1 %
Basophils Absolute: 0 10*3/uL (ref 0.0–0.1)
Eosinophils Absolute: 0.1 10*3/uL (ref 0.0–0.7)
Eosinophils Relative: 1 %
HEMATOCRIT: 38.3 % — AB (ref 39.0–52.0)
HEMOGLOBIN: 11.9 g/dL — AB (ref 13.0–17.0)
Lymphocytes Relative: 25 %
Lymphs Abs: 2.1 10*3/uL (ref 0.7–4.0)
MCH: 24.2 pg — ABNORMAL LOW (ref 26.0–34.0)
MCHC: 31.1 g/dL (ref 30.0–36.0)
MCV: 78 fL (ref 78.0–100.0)
MONOS PCT: 10 %
Monocytes Absolute: 0.8 10*3/uL (ref 0.1–1.0)
NEUTROS ABS: 5.4 10*3/uL (ref 1.7–7.7)
Neutrophils Relative %: 63 %
Platelets: 305 10*3/uL (ref 150–400)
RBC: 4.91 MIL/uL (ref 4.22–5.81)
RDW: 16.3 % — ABNORMAL HIGH (ref 11.5–15.5)
WBC: 8.5 10*3/uL (ref 4.0–10.5)

## 2015-09-30 LAB — C DIFFICILE QUICK SCREEN W PCR REFLEX
C DIFFICILE (CDIFF) INTERP: NEGATIVE
C DIFFICILE (CDIFF) TOXIN: NEGATIVE
C DIFFICLE (CDIFF) ANTIGEN: NEGATIVE

## 2015-09-30 LAB — BASIC METABOLIC PANEL
ANION GAP: 9 (ref 5–15)
BUN: 17 mg/dL (ref 6–20)
CALCIUM: 9.1 mg/dL (ref 8.9–10.3)
CO2: 29 mmol/L (ref 22–32)
CREATININE: 1.21 mg/dL (ref 0.61–1.24)
Chloride: 100 mmol/L — ABNORMAL LOW (ref 101–111)
GFR calc Af Amer: 58 mL/min — ABNORMAL LOW (ref >60)
GFR, EST NON AFRICAN AMERICAN: 50 mL/min — AB (ref >60)
GLUCOSE: 106 mg/dL — AB (ref 65–99)
Potassium: 4.1 mmol/L (ref 3.5–5.1)
Sodium: 138 mmol/L (ref 135–145)

## 2015-09-30 LAB — GLUCOSE, CAPILLARY
Glucose-Capillary: 82 mg/dL (ref 65–99)
Glucose-Capillary: 94 mg/dL (ref 65–99)
Glucose-Capillary: 96 mg/dL (ref 65–99)

## 2015-09-30 LAB — LACTIC ACID, PLASMA
LACTIC ACID, VENOUS: 2.1 mmol/L — AB (ref 0.5–2.0)
LACTIC ACID, VENOUS: 1.3 mmol/L (ref 0.5–2.0)

## 2015-09-30 MED ORDER — ACETAMINOPHEN 325 MG PO TABS
650.0000 mg | ORAL_TABLET | Freq: Four times a day (QID) | ORAL | Status: DC | PRN
  Administered 2015-09-30 – 2015-10-02 (×2): 650 mg via ORAL
  Filled 2015-09-30 (×2): qty 2

## 2015-09-30 MED ORDER — METRONIDAZOLE IN NACL 5-0.79 MG/ML-% IV SOLN
500.0000 mg | Freq: Three times a day (TID) | INTRAVENOUS | Status: DC
  Administered 2015-09-30 – 2015-10-03 (×10): 500 mg via INTRAVENOUS
  Filled 2015-09-30 (×10): qty 100

## 2015-09-30 MED ORDER — SODIUM CHLORIDE 0.9 % IV SOLN
INTRAVENOUS | Status: AC
  Administered 2015-09-30: 03:00:00 via INTRAVENOUS

## 2015-09-30 MED ORDER — INFLUENZA VAC SPLIT QUAD 0.5 ML IM SUSY
0.5000 mL | PREFILLED_SYRINGE | INTRAMUSCULAR | Status: AC
  Administered 2015-10-03: 0.5 mL via INTRAMUSCULAR
  Filled 2015-09-30: qty 0.5

## 2015-09-30 MED ORDER — ENOXAPARIN SODIUM 40 MG/0.4ML ~~LOC~~ SOLN
40.0000 mg | SUBCUTANEOUS | Status: DC
  Administered 2015-09-30 – 2015-10-01 (×2): 40 mg via SUBCUTANEOUS
  Filled 2015-09-30 (×3): qty 0.4

## 2015-09-30 MED ORDER — ACETAMINOPHEN 650 MG RE SUPP: 650.0000 mg | Freq: Four times a day (QID) | RECTAL | Status: DC | PRN

## 2015-09-30 MED ORDER — ONDANSETRON HCL 4 MG PO TABS: 4.0000 mg | ORAL_TABLET | Freq: Four times a day (QID) | ORAL | Status: DC | PRN

## 2015-09-30 MED ORDER — CIPROFLOXACIN IN D5W 400 MG/200ML IV SOLN
400.0000 mg | Freq: Two times a day (BID) | INTRAVENOUS | Status: DC
  Administered 2015-09-30 – 2015-10-02 (×6): 400 mg via INTRAVENOUS
  Filled 2015-09-30 (×8): qty 200

## 2015-09-30 MED ORDER — ONDANSETRON HCL 4 MG/2ML IJ SOLN
4.0000 mg | Freq: Four times a day (QID) | INTRAMUSCULAR | Status: DC | PRN
  Administered 2015-10-01: 4 mg via INTRAVENOUS
  Filled 2015-09-30: qty 2

## 2015-09-30 MED ORDER — HYDROMORPHONE HCL 1 MG/ML IJ SOLN
1.0000 mg | INTRAMUSCULAR | Status: DC | PRN
  Administered 2015-09-30 – 2015-10-02 (×5): 2 mg via INTRAVENOUS
  Administered 2015-10-03: 1 mg via INTRAVENOUS
  Filled 2015-09-30 (×4): qty 2
  Filled 2015-09-30: qty 1
  Filled 2015-09-30: qty 2

## 2015-09-30 NOTE — Care Management Note (Signed)
Case Management Note  Patient Details  Name: Luis Norton MRN: [id number] Date of Birth: 1922-07-22  Subjective/Objective:                  Date- 09-30-15 Initial Assessment Spoke with patient at the bedside.  Introduced self as Sports coachcase manager and explained role in discharge planning and how to be reached.  [place]  Verified patient anticipates to go home with family, at time of discharge.  Patient has no DME. Expressed potential need for no other DME.  Patient denied  needing help with their medication.  Patient is driven by son to MD appointments.  Verified patient has PCP [facility].  Patient very independent with ADLs and ambulation. Admitted for colitis, receiving IVF and IV Abx.  Plan: CM will continue to follow [facility] discharge planning [facility].   Lawerance Sabalebbie Remmi Armenteros RN BSN CM [phone]   Action/Plan:  Will continue to monitor.  Expected Discharge Date:                  Expected Discharge Plan:  Home/Self Care  In-House Referral:     Discharge planning Services  CM Consult  Post Acute Care Choice:    Choice offered to:     DME Arranged:    DME Agency:     HH Arranged:    HH Agency:     Status of Service:  In process, will continue to follow  Medicare Important Message Given:    Date Medicare IM Given:    Medicare IM give by:    Date Additional Medicare IM Given:    Additional Medicare Important Message give by:     If discussed at Long Length of Stay Meetings, dates discussed:    Additional Comments:  Lawerance SabalDebbie Davelle Anselmi, RN 09/30/2015, 11:51 AM

## 2015-09-30 NOTE — Progress Notes (Signed)
Utilization review completed. Aadam Zhen, RN, BSN. 

## 2015-09-30 NOTE — H&P (Signed)
[facility] History and Physical  Tion Tse ION:[id number] DOB: Jul 30, 1922 DOA: 09/29/2015  Referring physician: Dr. Ranae Palms. PCP: Katy Apo, MD  Specialists: None.  History obtained from ER physician and patient. Unable to reach family.  Chief Complaint: Nausea vomiting and diarrhea.  HPI: Luis Norton is a 82 y.o. male with history of paroxysmal atrial fibrillation and hypertension presently on no medications was brought to the ER because of nausea vomiting and diarrhea. As per the patient he has been having some nausea and vomiting last 3 days with diarrhea. Patient also had some periumbilical discomfort. CT scan shows colitis involving the ascending and descending colon. Patient has been admitted for further management. On exam patient is not in distress. Patient's abdominal pain improved with pain medication in the ER. Patient was admitted in September for acute renal failure and at that time also had GI bleed with EGD showing ulcerative esophagitis. Prior to that patient had surgery for incarcerated hernia.   Review of Systems: As presented in the history of presenting illness, rest negative.  Past Medical History  Diagnosis Date  . Hypertension   . Hypercholesterolemia   . ED (erectile dysfunction)   . Urinary frequency     bladder instablity and outlet obstruction   . Constipation   . PUD (peptic ulcer disease)     history of   . BPH (benign prostatic hyperplasia)   . Atrial fibrillation [facility])    Past Surgical History  Procedure Laterality Date  . Ankle surgery Right   . Inguinal hernia repair Bilateral 05/20/2015    Procedure: HERNIA REPAIR INGUINAL ADULT BILATERAL;  Surgeon: Abigail Miyamoto, MD;  Location: [facility] OR;  Service: General;  Laterality: Bilateral;  . Insertion of mesh Bilateral 05/20/2015    Procedure: INSERTION OF MESH;  Surgeon: Abigail Miyamoto, MD;  Location: [facility] OR;  Service: General;  Laterality: Bilateral;  . Esophagogastroduodenoscopy  N/A 05/26/2015    Procedure: ESOPHAGOGASTRODUODENOSCOPY (EGD);  Surgeon: Vida Rigger, MD;  Location: [facility] ENDOSCOPY;  Service: Endoscopy;  Laterality: N/A;   Social History:  reports that he has quit smoking. He does not have any smokeless tobacco history on file. He reports that he does not drink alcohol or use illicit drugs. Where does patient live home. Can patient participate in ADLs? Yes.  Allergies  Allergen Reactions  . Penicillins Swelling    Has patient had a PCN reaction causing immediate rash, facial/tongue/throat swelling, SOB or lightheadedness with hypotension: YES Has patient had a PCN reaction causing severe rash involving mucus membranes or skin necrosis: NO Has patient had a PCN reaction that required hospitalization NO Has patient had a PCN reaction occurring within the last 10 years: NO If all of the above answers are "NO", then may proceed with Cephalosporin use.    Family History:  Family History  Problem Relation Age of Onset  . Hypertension Other       Prior to Admission medications   Medication Sig Start Date End Date Taking? Authorizing Provider  bisacodyl (DULCOLAX) 5 MG EC tablet 1 tab q hs Prn constipation Patient not taking: Reported on 09/29/2015 09/23/13   Hayden Rasmussen, NP  metoprolol succinate (TOPROL-XL) 25 MG 24 hr tablet Take 1 tablet (25 mg total) by mouth daily. Patient not taking: Reported on 05/24/2015 05/22/15   Clydia Llano, MD  pantoprazole (PROTONIX) 40 MG tablet Take 1 tablet (40 mg total) by mouth 2 (two) times daily. Switch for any other PPI at similar dose and frequency Patient not taking: Reported on 09/29/2015  05/28/15   Leroy Sea, MD  polyethylene glycol powder (GLYCOLAX/MIRALAX) powder Take 255 g by mouth daily as needed. Patient not taking: Reported on 09/29/2015 05/28/15   Leroy Sea, MD  traMADol (ULTRAM) 50 MG tablet Take 0.5-1 tablets (25-50 mg total) by mouth every 6 (six) hours as needed for severe pain. Patient not taking:  Reported on 09/29/2015 05/22/15   Clydia Llano, MD    Physical Exam: Filed Vitals:   09/29/15 2000 09/29/15 2045 09/29/15 2215 09/30/15 0000  BP: 148/92 160/89 151/82 129/47  Pulse: 81 65 75 67  Temp:    97.4 F (36.3 C)  TempSrc:    Oral  Resp: 18 14 17 16   Height:    5\' 7"  (1.702 m)  Weight:    65 kg (143 lb 4.8 oz)  SpO2: 96% 97% 95% 96%     General:  Moderately built and nourished.  Eyes: Anicteric no pallor.  ENT: No discharge from the ears eyes nose or mouth.  Neck: No mass felt.  Cardiovascular: S1 and S2 heard.  Respiratory: No rhonchi or crepitations.  Abdomen: Soft nontender bowel sounds present. No guarding or rigidity.  Skin: No rash.  Musculoskeletal: No edema.  Psychiatric: Appears normal.  Neurologic: Alert awake oriented to time place and person. Moves all extremities.  Labs on Admission:  Basic Metabolic Panel:  Recent Labs Lab 09/28/15 1819 09/29/15 1510  NA 134* 137  K 3.9 4.4  CL 94* 96*  CO2 28 31  GLUCOSE 101* 100*  BUN 21* 16  CREATININE 1.38* 1.38*  CALCIUM 9.3 10.0   Liver Function Tests:  Recent Labs Lab 09/28/15 1819 09/29/15 1510  AST 18 18  ALT 9* 9*  ALKPHOS 140* 134*  BILITOT 0.5 0.4  PROT 7.9 7.6  ALBUMIN 3.8 3.6    Recent Labs Lab 09/28/15 1819 09/29/15 1510  LIPASE 41 31   No results for input(s): AMMONIA in the last 168 hours. CBC:  Recent Labs Lab 09/28/15 1819 09/29/15 1510  WBC 7.9 8.9  HGB 12.6* 12.3*  HCT 40.7 38.7*  MCV 78.1 78.5  PLT 331 323   Cardiac Enzymes: No results for input(s): CKTOTAL, CKMB, CKMBINDEX, TROPONINI in the last 168 hours.  BNP (last 3 results) No results for input(s): BNP in the last 8760 hours.  ProBNP (last 3 results) No results for input(s): PROBNP in the last 8760 hours.  CBG: No results for input(s): GLUCAP in the last 168 hours.  Radiological Exams on Admission: Ct Abdomen Pelvis W Contrast  09/29/2015  CLINICAL DATA:  82 year old male with umbilical  pain and nausea, vomiting and diarrhea, onset yesterday. EXAM: CT ABDOMEN AND PELVIS WITH CONTRAST TECHNIQUE: Multidetector CT imaging of the abdomen and pelvis was performed using the standard protocol following bolus administration of intravenous contrast. CONTRAST:  75mL OMNIPAQUE IOHEXOL 300 MG/ML  SOLN COMPARISON:  CT 05/24/2015 FINDINGS: Lower chest: No consolidation. Cardiomegaly is unchanged. Coronary artery calcifications are seen. Calcified granuloma at the right lung base. Liver: No focal lesion Hepatobiliary: Gallbladder physiologically distended, no calcified stone. No biliary dilatation. Pancreas: No ductal dilatation or inflammation. Spleen: Normal. Adrenal glands: No nodule. Kidneys: Symmetric renal enhancement. No hydronephrosis. Large left renal cyst measuring at least 7.8 cm is again seen. Questionable punctate nonobstructing left nephrolithiasis. Stomach/Bowel: Stomach physiologically distended with ingested contents. Lack of enteric contrast limits bowel evaluation. Fluid-filled normal caliber small bowel throughout the abdomen. Liquid stool suspected in the colon with colonic wall thickening involving the ascending and descending  colon. Mild gaseous distention of transverse colon which appears tortuous. Appendix tentatively identified and normal. Vascular/Lymphatic: No retroperitoneal adenopathy. Abdominal aorta is normal in caliber. Atherosclerosis and tortuosity of the abdominal aorta. 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 Reproductive: Heterogeneous enlarged prostate gland causing mass effect on the bladder base. Bladder: Physiologically distended, mild chronic wall thickening. Other: No free air, free fluid, or intra-abdominal fluid collection. Resolution of previous inguinal edema post hernia repair. No recurrent hernia. Musculoskeletal: There are no acute or suspicious osseous abnormalities. Scoliosis and degenerative change  throughout spine. IMPRESSION: 1. Bowel wall thickening of the ascending and descending colon, with liquid stool suspected, most consistent with diarrheal illness, mild colitis. No small bowel dilatation. 2. Stable chronic findings as described. Electronically Signed   By: Rubye OaksMelanie  Ehinger M.D.   On: 09/29/2015 21:43     Assessment/Plan Principal Problem:   Colitis Active Problems:   Vomiting and diarrhea   ARF (acute renal failure) (HCC)   1. Colitis - suspect infectious colitis. Other differentials include ischemic. Check lactic acid levels. Check GI pathogen panel and C. difficile. Patient has been empirically placed on Flagyl and Cipro. Continue with gentle hydration. 2. Acute renal failure probably from dehydration - gently hydrate and recheck metabolic panel and closely follow intake output. 3. History of paroxysmal atrial fibrillation - patient is not on any medication as per the patient. Grossly follow heart rate. Patient is not on anticoagulation secondary to history of GI bleed and also risk of falls. Chads 2 vasc score is 3. 4. History of hypertension currently on no medications. Closely follow blood pressure trends. 5. Cognitive deficits with possible dementia. 6. History of ulcerative esophagitis in September last year as per EGD.  I was unable to reach patient's family at this time. Patient lives with his son. May discuss with patient's son in a.m. once able to reach to get further history.   DVT Prophylaxis Lovenox.  Code Status: DO NOT RESUSCITATE.  Family Communication: Unable to reach family.  Disposition Plan: Admit to inpatient.    Makaia Rappa N. [facility] Pager [phone].  If 7PM-7AM, please contact night-coverage [email or web address] Password [email or web address] 09/30/2015, 1:42 AM

## 2015-09-30 NOTE — Progress Notes (Signed)
NURSING PROGRESS NOTE  Luis Norton [id number] Admission Data: 09/30/2015 3:28 AM Attending Provider: Eduard ClosArshad N Kakrakandy, MD VOZ:DGUYQI,HKVQQVPCP:POLITE,RONALD D, MD Code Status: DNR  Allergies:  Penicillins Past Medical History:   has a past medical history of Hypertension; Hypercholesterolemia; ED (erectile dysfunction); Urinary frequency; Constipation; PUD (peptic ulcer disease); BPH (benign prostatic hyperplasia); and Atrial fibrillation (HCC). Past Surgical History:   has past surgical history that includes Ankle surgery (Right); Inguinal hernia repair (Bilateral, 05/20/2015); Insertion of mesh (Bilateral, 05/20/2015); and Esophagogastroduodenoscopy (N/A, 05/26/2015). Social History:   reports that he has quit smoking. He does not have any smokeless tobacco history on file. He reports that he does not drink alcohol or use illicit drugs.  Luis Norton is a 82 y.o. male patient admitted from ED:   Last Documented Vital Signs: Blood pressure 129/47, pulse 67, temperature 97.4 F (36.3 C), temperature source Oral, resp. rate 16, height 5\' 7"  (1.702 m), weight 65 kg (143 lb 4.8 oz), SpO2 96 %.  Cardiac Monitoring:   IV Fluids:  IV in place, occlusive dsg intact without redness, IV cath antecubital right, condition patent and no redness normal saline.   Skin: Dry and intact  Patient/Family orientated to room. Information packet given to patient/family. Admission inpatient armband information verified with patient/family to include name and date of birth and placed on patient arm. Side rails up x 2, fall assessment and education completed with patient/family. Patient/family able to verbalize understanding of risk associated with falls and verbalized understanding to call for assistance before getting out of bed. Call light within reach. Patient/family able to voice and demonstrate understanding of unit orientation instructions.    Will continue to evaluate and treat per MD orders.  Sue LushKaelin Romesberg RN,  BSN

## 2015-09-30 NOTE — Consult Note (Signed)
   [facility]   09/30/2015  Luis Norton [birth year] [id number]  Patient evaluated for community based chronic disease management services with [facility] as a benefit [facility]. Spoke with patient and family at bedside to explain [facility]. Patient with frequent admissions in the past 6 months.  Family states that the patient has moved in with his son at [address], [place], [place] [address].  Patient's daughters, Luis Norton and Luis Norton (caregiver) states she will be assisting with helping out with managing his care.  Today, they did not have his insurance card or any medication list.  States that they believe he was using [facility] but when he left the nursing facility they are not sure he has had any of his medications filled. Stating that his caregiver at the time did not always follow up with getting him to appointments. They were given the customer service number [facility] regarding his [facility] change and new insurance card.  Consent form signed. Patient will receive post hospital discharge call and will be evaluated for monthly home visits for assessments and disease process education.  Left contact information and [facility] literature at bedside. Will make Inpatient Case Manager aware that [facility] following. Of note, [facility] Care [facility] services does not replace or interfere with any services that are arranged by inpatient case management or social work.  For additional questions or referrals please contact:   Charlesetta ShanksVictoria Sharisse Rantz, RN BSN CCM [facility] Lia[facility]  [phone] business mobile phone

## 2015-09-30 NOTE — Progress Notes (Addendum)
[facility] PROGRESS NOTE  Luis Constablendy Bergey HYQ:[id number] DOB: Dec 02, 1921 DOA: 09/29/2015 PCP: Katy ApoPOLITE,RONALD D, MD   Chief Complaint: Nausea vomiting and diarrhea.  HPI: Luis Norton is a 82 y.o. male with history of paroxysmal atrial fibrillation and hypertension presently on no medications was brought to the ER because of nausea vomiting and diarrhea. As per the patient he has been having some nausea and vomiting last 3 days with diarrhea. Patient also had some periumbilical discomfort. CT scan shows colitis involving the ascending and descending colon. Patient was admitted in September for acute renal failure and at that time also had GI bleed with EGD showing ulcerative esophagitis. Prior to that patient had surgery for incarcerated hernia.    Assessment/Plan: 1. Colitis  -improving, likely infectious vs ischemic, lactic acid improving and reassuring  - FU GI pathogen panel and C. Difficile  -Continue Empiric Flagyl and Cipro, IVF  -Due to increased pain this am i will make him NPO, re-attempt clears this evening based on symptoms  2. Acute renal failure    -pre-renal due GI losses, resolved, continue IVF today . 3. History of paroxysmal atrial fibrillation     -rate controlled in NSR now, not on anticoagulation secondary to history of GI bleed and Fall risk. Chads 2 vasc score is 3.  4. 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     -stable  5. History of ulcerative esophagitis in September last year as per EGD.      -resume PPI, if Cdiff ruled out  DVT proph: lovenox  Code Status: DNR Family Communication: none at bedside Disposition Plan: home in 2days probably   HPI/Subjective: Was having a lot of pain earlier this am, improving now No BRBPR  Objective: Filed Vitals:   09/30/15 0000 09/30/15 0437  BP: 129/47 165/78  Pulse: 67 75  Temp: 97.4 F (36.3 C) 97.8 F (36.6 C)  Resp: 16 16    Intake/Output Summary (Last 24 hours) at 09/30/15  0958 Last data filed at 09/30/15 [id number]  Gross per 24 hour  Intake 631.25 ml  Output    125 ml  Net 506.25 ml   Filed Weights   09/30/15 0000  Weight: 65 kg (143 lb 4.8 oz)    Exam:   General:  AAO to self and place only, didn't know month/year  Cardiovascular: S1S2/RRR  Respiratory: CTAB  Abdomen: soft, Mildly distended, BS present   Musculoskeletal: no edema c/c  Data Reviewed: Basic Metabolic Panel:  Recent Labs Lab 09/28/15 1819 09/29/15 1510 09/30/15 0214  NA 134* 137 138  K 3.9 4.4 4.1  CL 94* 96* 100*  CO2 28 31 29   GLUCOSE 101* 100* 106*  BUN 21* 16 17  CREATININE 1.38* 1.38* 1.21  CALCIUM 9.3 10.0 9.1   Liver Function Tests:  Recent Labs Lab 09/28/15 1819 09/29/15 1510  AST 18 18  ALT 9* 9*  ALKPHOS 140* 134*  BILITOT 0.5 0.4  PROT 7.9 7.6  ALBUMIN 3.8 3.6    Recent Labs Lab 09/28/15 1819 09/29/15 1510  LIPASE 41 31   No results for input(s): AMMONIA in the last 168 hours. CBC:  Recent Labs Lab 09/28/15 1819 09/29/15 1510 09/30/15 0214  WBC 7.9 8.9 8.5  NEUTROABS  --   --  5.4  HGB 12.6* 12.3* 11.9*  HCT 40.7 38.7* 38.3*  MCV 78.1 78.5 78.0  PLT 331 323 305   Cardiac Enzymes: No results for input(s): CKTOTAL, CKMB, CKMBINDEX, TROPONINI in the last 168 hours. BNP (last  3 results) No results for input(s): BNP in the last 8760 hours.  ProBNP (last 3 results) No results for input(s): PROBNP in the last 8760 hours.  CBG:  Recent Labs Lab 09/30/15 0542  GLUCAP 82    No results found for this or any previous visit (from the past 240 hour(s)).   Studies: Ct Abdomen Pelvis W Contrast  09/29/2015  CLINICAL DATA:  82 year old male with umbilical pain and nausea, vomiting and diarrhea, onset yesterday. EXAM: CT ABDOMEN AND PELVIS WITH CONTRAST TECHNIQUE: Multidetector CT imaging of the abdomen and pelvis was performed using the standard protocol following bolus administration of intravenous contrast. CONTRAST:  75mL OMNIPAQUE  IOHEXOL 300 MG/ML  SOLN COMPARISON:  CT 05/24/2015 FINDINGS: Lower chest: No consolidation. Cardiomegaly is unchanged. Coronary artery calcifications are seen. Calcified granuloma at the right lung base. Liver: No focal lesion Hepatobiliary: Gallbladder physiologically distended, no calcified stone. No biliary dilatation. Pancreas: No ductal dilatation or inflammation. Spleen: Normal. Adrenal glands: No nodule. Kidneys: Symmetric renal enhancement. No hydronephrosis. Large left renal cyst measuring at least 7.8 cm is again seen. Questionable punctate nonobstructing left nephrolithiasis. Stomach/Bowel: Stomach physiologically distended with ingested contents. Lack of enteric contrast limits bowel evaluation. Fluid-filled normal caliber small bowel throughout the abdomen. Liquid stool suspected in the colon with colonic wall thickening involving the ascending and descending colon. Mild gaseous distention of transverse colon which appears tortuous. Appendix tentatively identified and normal. Vascular/Lymphatic: No retroperitoneal adenopathy. Abdominal aorta is normal in caliber. Atherosclerosis and tortuosity of the abdominal aorta. 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 Reproductive: Heterogeneous enlarged prostate gland causing mass effect on the bladder base. Bladder: Physiologically distended, mild chronic wall thickening. Other: No free air, free fluid, or intra-abdominal fluid collection. Resolution of previous inguinal edema post hernia repair. No recurrent hernia. Musculoskeletal: There are no acute or suspicious osseous abnormalities. Scoliosis and degenerative change throughout spine. IMPRESSION: 1. Bowel wall thickening of the ascending and descending colon, with liquid stool suspected, most consistent with diarrheal illness, mild colitis. No small bowel dilatation. 2. Stable chronic findings as described. Electronically Signed   By: Rubye Oaks M.D.   On: 09/29/2015 21:43    Scheduled Meds: . ciprofloxacin  400 mg Intravenous BID  . enoxaparin (LOVENOX) injection  40 mg Subcutaneous Q24H  . [START ON 10/01/2015] Influenza vac split quadrivalent PF  0.5 mL Intramuscular Tomorrow-1000  . metronidazole  500 mg Intravenous Q8H   Continuous Infusions: . sodium chloride 75 mL/hr at 09/30/15 0236   Antibiotics Given (last 72 hours)    Date/Time Action Medication Dose Rate   09/30/15 0119 Given   ciprofloxacin (CIPRO) IVPB 400 mg 400 mg 200 mL/hr   09/30/15 0543 Given   metroNIDAZOLE (FLAGYL) IVPB 500 mg 500 mg 100 mL/hr      Principal Problem:   Colitis Active Problems:   Vomiting and diarrhea   ARF (acute renal failure) (HCC)    Time spent:    [facility]  [facility] Pager [phone]. If 7PM-7AM, please contact night-coverage at [email or web address], password [email or web address] 09/30/2015, 9:58 AM  LOS: 0 days

## 2015-09-30 NOTE — Progress Notes (Signed)
CRITICAL VALUE ALERT  Critical value received: Lactic Acid 2.1  Date of notification: 09/30/2015  Time of notification: 0320  Critical value read back:Yes.    Nurse who received alert:  Sue LushKaelin Romesberg  MD notified (1st page):  Craige CottaKirby  Time of first page:  0321  MD notified (2nd page):  Time of second page:  Responding MD:    Time MD responded:

## 2015-10-01 LAB — GLUCOSE, CAPILLARY
GLUCOSE-CAPILLARY: 106 mg/dL — AB (ref 65–99)
GLUCOSE-CAPILLARY: 146 mg/dL — AB (ref 65–99)
GLUCOSE-CAPILLARY: 87 mg/dL (ref 65–99)
Glucose-Capillary: 119 mg/dL — ABNORMAL HIGH (ref 65–99)

## 2015-10-01 LAB — CBC
HEMATOCRIT: 40.6 % (ref 39.0–52.0)
HEMOGLOBIN: 12.5 g/dL — AB (ref 13.0–17.0)
MCH: 24.3 pg — ABNORMAL LOW (ref 26.0–34.0)
MCHC: 30.8 g/dL (ref 30.0–36.0)
MCV: 79 fL (ref 78.0–100.0)
Platelets: 338 10*3/uL (ref 150–400)
RBC: 5.14 MIL/uL (ref 4.22–5.81)
RDW: 16.4 % — ABNORMAL HIGH (ref 11.5–15.5)
WBC: 13.3 10*3/uL — ABNORMAL HIGH (ref 4.0–10.5)

## 2015-10-01 LAB — BASIC METABOLIC PANEL
ANION GAP: 9 (ref 5–15)
BUN: 10 mg/dL (ref 6–20)
CALCIUM: 9.1 mg/dL (ref 8.9–10.3)
CO2: 29 mmol/L (ref 22–32)
Chloride: 99 mmol/L — ABNORMAL LOW (ref 101–111)
Creatinine, Ser: 1.1 mg/dL (ref 0.61–1.24)
GFR, EST NON AFRICAN AMERICAN: 57 mL/min — AB (ref >60)
GLUCOSE: 120 mg/dL — AB (ref 65–99)
POTASSIUM: 3.8 mmol/L (ref 3.5–5.1)
Sodium: 137 mmol/L (ref 135–145)

## 2015-10-01 MED ORDER — POTASSIUM CHLORIDE IN NACL 20-0.9 MEQ/L-% IV SOLN
INTRAVENOUS | Status: DC
  Administered 2015-10-01 – 2015-10-02 (×2): via INTRAVENOUS
  Filled 2015-10-01 (×4): qty 1000

## 2015-10-01 NOTE — Evaluation (Signed)
Physical Therapy Evaluation Patient Details Name: Luis Norton MRN: [id number] DOB: 31-Oct-1921 Today's Date: 10/01/2015   History of Present Illness  Pt adm with N/V due to colitis. PMH - dementia, GI bleed, A-fib  Clinical Impression  Pt admitted with above diagnosis and presents to PT with functional limitations due to deficits listed below (See PT problem list). Pt needs skilled PT to maximize independence and safety to allow discharge to home with son. Feel like he will progress well and will not need follow up after DC.     Follow Up Recommendations No PT follow up    Equipment Recommendations  None recommended by PT    Recommendations for Other Services       Precautions / Restrictions Precautions Precautions: Fall Restrictions Weight Bearing Restrictions: No      Mobility  Bed Mobility Overal bed mobility: Modified Independent                Transfers Overall transfer level: Needs assistance Equipment used: Rolling walker (2 wheeled) Transfers: Sit to/from Stand Sit to Stand: Supervision         General transfer comment: verbal cues for hand placement  Ambulation/Gait Ambulation/Gait assistance: Min guard;Supervision Ambulation Distance (Feet): 170 Feet Assistive device: Rolling walker (2 wheeled) Gait Pattern/deviations: Step-through pattern;Decreased stride length;Drifts right/left   Gait velocity interpretation: <1.8 ft/sec, indicative of risk for recurrent falls General Gait Details: Initially min guard for balance when amb without walker. With walker pt supervision.  Stairs            Wheelchair Mobility    Modified Rankin (Stroke Patients Only)       Balance Overall balance assessment: Needs assistance Sitting-balance support: No upper extremity supported;Feet supported Sitting balance-Leahy Scale: Good     Standing balance support: No upper extremity supported Standing balance-Leahy Scale: Fair                                Pertinent Vitals/Pain Pain Assessment: No/denies pain    Home Living Family/patient expects to be discharged to:: Private residence Living Arrangements: Children Available Help at Discharge: Family Type of Home: Apartment Home Access: Level entry     Home Layout: One level Home Equipment: Environmental consultantWalker - 2 wheels      Prior Function Level of Independence: Needs assistance   Gait / Transfers Assistance Needed: Per pt he amb independently. Has rolling walker at home but pt not clear if he has currently been using it.           Hand Dominance   Dominant Hand: Right    Extremity/Trunk Assessment   Upper Extremity Assessment: Overall WFL for tasks assessed           Lower Extremity Assessment: Overall WFL for tasks assessed         Communication   Communication: No difficulties  Cognition Arousal/Alertness: Awake/alert Behavior During Therapy: WFL for tasks assessed/performed Overall Cognitive Status: History of cognitive impairments - at baseline                      General Comments      Exercises        Assessment/Plan    PT Assessment Patient needs continued PT services  PT Diagnosis Difficulty walking   PT Problem List Decreased balance;Decreased mobility;Decreased knowledge of use of DME;Decreased cognition  PT Treatment Interventions DME instruction;Gait training;Functional mobility training;Therapeutic activities;Therapeutic exercise;Balance training;Patient/family education  PT Goals (Current goals can be found in the Care Plan section) Acute Rehab PT Goals Patient Stated Goal: return home PT Goal Formulation: With patient Time For Goal Achievement: 10/08/15 Potential to Achieve Goals: Good    Frequency Min 3X/week   Barriers to discharge        Co-evaluation               End of Session Equipment Utilized During Treatment: Gait belt Activity Tolerance: Patient tolerated treatment well Patient left: in  chair;with call bell/phone within reach;with chair alarm set Nurse Communication: Mobility status         Time: 4782-9562 PT Time Calculation (min) (ACUTE ONLY): 17 min   Charges:   PT Evaluation $PT Eval Moderate Complexity: 1 Procedure     PT G Codes:        Domenik Trice October 29, 2015, 3:22 PM [facility] PT [phone]

## 2015-10-01 NOTE — Progress Notes (Addendum)
[facility] PROGRESS NOTE  Luis Norton ZOX:[id number] DOB: [birth year] DOA: 09/29/2015 PCP: Katy Apo, MD   Chief Complaint: Nausea vomiting and diarrhea.  HPI: Luis Norton is a 82 y.o. male with history of paroxysmal atrial fibrillation and hypertension presently on no medications was brought to the ER because of nausea vomiting and diarrhea. As per the patient he has been having some nausea and vomiting last 3 days with diarrhea. Patient also had some periumbilical discomfort. CT scan shows colitis involving the ascending and descending colon. Patient was admitted in September for acute renal failure and at that time also had GI bleed with EGD showing ulcerative esophagitis. Prior to that patient had surgery for incarcerated hernia.    Assessment/Plan: 1. Colitis  -improving, likely infectious vs ischemic, lactic acid improving and reassuring  - FU GI pathogen panel and C. Difficile negative   -Continue Empiric Flagyl and Cipro, IVF  -Due to increased pain this am i will make him NPO, re-attempt clears this evening based on symptoms  2. Acute renal failure reolved    -pre-renal due GI losses, resolved, continue IVF today . 3. History of paroxysmal atrial fibrillation     -rate controlled in NSR now, not on anticoagulation secondary to history of GI bleed and Fall risk. Chads 2 vasc score is 3.  4. 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     -stable  5. History of ulcerative esophagitis in September last year as per EGD.      -resume PPI, if Cdiff ruled out  DVT proph: lovenox  Code Status: DNR Family Communication: none at bedside Disposition Plan: home in 1 days probably , pt eval    HPI/Subjective: Continues to have nausea   Objective: Filed Vitals:   09/30/15 2152 10/01/15 0556  BP: 184/101 187/89  Pulse: 79 68  Temp: 97.5 F (36.4 C) 98.6 F (37 C)  Resp: 16 18    Intake/Output Summary (Last 24 hours) at 10/01/15 1226 Last data  filed at 10/01/15 8119  Gross per 24 hour  Intake    400 ml  Output    600 ml  Net   -200 ml   Filed Weights   09/30/15 0000  Weight: 65 kg (143 lb 4.8 oz)    Exam:   General:  AAO to self and place only, didn't know month/year  Cardiovascular: S1S2/RRR  Respiratory: CTAB  Abdomen: soft, Mildly distended, BS present   Musculoskeletal: no edema c/c  Data Reviewed: Basic Metabolic Panel:  Recent Labs Lab 09/28/15 1819 09/29/15 1510 09/30/15 0214 10/01/15 0540  NA 134* 137 138 137  K 3.9 4.4 4.1 3.8  CL 94* 96* 100* 99*  CO2 28 31 29 29   GLUCOSE 101* 100* 106* 120*  BUN 21* 16 17 10   CREATININE 1.38* 1.38* 1.21 1.10  CALCIUM 9.3 10.0 9.1 9.1   Liver Function Tests:  Recent Labs Lab 09/28/15 1819 09/29/15 1510  AST 18 18  ALT 9* 9*  ALKPHOS 140* 134*  BILITOT 0.5 0.4  PROT 7.9 7.6  ALBUMIN 3.8 3.6    Recent Labs Lab 09/28/15 1819 09/29/15 1510  LIPASE 41 31   No results for input(s): AMMONIA in the last 168 hours. CBC:  Recent Labs Lab 09/28/15 1819 09/29/15 1510 09/30/15 0214 10/01/15 0540  WBC 7.9 8.9 8.5 13.3*  NEUTROABS  --   --  5.4  --   HGB 12.6* 12.3* 11.9* 12.5*  HCT 40.7 38.7* 38.3* 40.6  MCV 78.1 78.5 78.0 79.0  PLT 331 323 305 338   Cardiac Enzymes: No results for input(s): CKTOTAL, CKMB, CKMBINDEX, TROPONINI in the last 168 hours. BNP (last 3 results) No results for input(s): BNP in the last 8760 hours.  ProBNP (last 3 results) No results for input(s): PROBNP in the last 8760 hours.  CBG:  Recent Labs Lab 09/30/15 1158 09/30/15 1804 10/01/15 0020 10/01/15 0600 10/01/15 1150  GLUCAP 94 96 87 106* 119*    Recent Results (from the past 240 hour(s))  C difficile quick scan w PCR reflex     Status: None   Collection Time: 09/30/15  8:49 AM  Result Value Ref Range Status   C Diff antigen NEGATIVE NEGATIVE Final   C Diff toxin NEGATIVE NEGATIVE Final   C Diff interpretation Negative for toxigenic C. difficile   Final     Studies: Ct Abdomen Pelvis W Contrast  09/29/2015  CLINICAL DATA:  82 year old male with umbilical pain and nausea, vomiting and diarrhea, onset yesterday. EXAM: CT ABDOMEN AND PELVIS WITH CONTRAST TECHNIQUE: Multidetector CT imaging of the abdomen and pelvis was performed using the standard protocol following bolus administration of intravenous contrast. CONTRAST:  75mL OMNIPAQUE IOHEXOL 300 MG/ML  SOLN COMPARISON:  CT 05/24/2015 FINDINGS: Lower chest: No consolidation. Cardiomegaly is unchanged. Coronary artery calcifications are seen. Calcified granuloma at the right lung base. Liver: No focal lesion Hepatobiliary: Gallbladder physiologically distended, no calcified stone. No biliary dilatation. Pancreas: No ductal dilatation or inflammation. Spleen: Normal. Adrenal glands: No nodule. Kidneys: Symmetric renal enhancement. No hydronephrosis. Large left renal cyst measuring at least 7.8 cm is again seen. Questionable punctate nonobstructing left nephrolithiasis. Stomach/Bowel: Stomach physiologically distended with ingested contents. Lack of enteric contrast limits bowel evaluation. Fluid-filled normal caliber small bowel throughout the abdomen. Liquid stool suspected in the colon with colonic wall thickening involving the ascending and descending colon. Mild gaseous distention of transverse colon which appears tortuous. Appendix tentatively identified and normal. Vascular/Lymphatic: No retroperitoneal adenopathy. Abdominal aorta is normal in caliber. Atherosclerosis and tortuosity of the abdominal aorta. 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 Reproductive: Heterogeneous enlarged prostate gland causing mass effect on the bladder base. Bladder: Physiologically distended, mild chronic wall thickening. Other: No free air, free fluid, or intra-abdominal fluid collection. Resolution of previous inguinal edema post hernia repair. No recurrent  hernia. Musculoskeletal: There are no acute or suspicious osseous abnormalities. Scoliosis and degenerative change throughout spine. IMPRESSION: 1. Bowel wall thickening of the ascending and descending colon, with liquid stool suspected, most consistent with diarrheal illness, mild colitis. No small bowel dilatation. 2. Stable chronic findings as described. Electronically Signed   By: Rubye Oaks M.D.   On: 09/29/2015 21:43    Scheduled Meds: . ciprofloxacin  400 mg Intravenous BID  . enoxaparin (LOVENOX) injection  40 mg Subcutaneous Q24H  . Influenza vac split quadrivalent PF  0.5 mL Intramuscular Tomorrow-1000  . metronidazole  500 mg Intravenous Q8H   Continuous Infusions:   Antibiotics Given (last 72 hours)    Date/Time Action Medication Dose Rate   09/30/15 0119 Given   ciprofloxacin (CIPRO) IVPB 400 mg 400 mg 200 mL/hr   09/30/15 0543 Given   metroNIDAZOLE (FLAGYL) IVPB 500 mg 500 mg 100 mL/hr   09/30/15 1239 Given   ciprofloxacin (CIPRO) IVPB 400 mg 400 mg 200 mL/hr   09/30/15 1552 Given   metroNIDAZOLE (FLAGYL) IVPB 500 mg 500 mg 100 mL/hr   09/30/15 2121 Given  metroNIDAZOLE (FLAGYL) IVPB 500 mg 500 mg 100 mL/hr   09/30/15 2121 Given   ciprofloxacin (CIPRO) IVPB 400 mg 400 mg 200 mL/hr   10/01/15 0457 Given   metroNIDAZOLE (FLAGYL) IVPB 500 mg 500 mg 100 mL/hr   10/01/15 1013 Given   ciprofloxacin (CIPRO) IVPB 400 mg 400 mg 200 mL/hr      Principal Problem:   Colitis Active Problems:   Vomiting and diarrhea   ARF (acute renal failure) (HCC)    Time spent: 35min    [facility],Somara Frymire  [facility] Pager [phone]. If 7PM-7AM, please contact night-coverage at [email or web address], password [email or web address] 10/01/2015, 12:26 PM  LOS: 1 day

## 2015-10-02 ENCOUNTER — Other Ambulatory Visit: Payer: Self-pay

## 2015-10-02 LAB — COMPREHENSIVE METABOLIC PANEL
ALT: 9 U/L — ABNORMAL LOW (ref 17–63)
ANION GAP: 7 (ref 5–15)
AST: 19 U/L (ref 15–41)
Albumin: 3.1 g/dL — ABNORMAL LOW (ref 3.5–5.0)
Alkaline Phosphatase: 114 U/L (ref 38–126)
BILIRUBIN TOTAL: 0.5 mg/dL (ref 0.3–1.2)
BUN: 12 mg/dL (ref 6–20)
CALCIUM: 8.7 mg/dL — AB (ref 8.9–10.3)
CO2: 29 mmol/L (ref 22–32)
Chloride: 102 mmol/L (ref 101–111)
Creatinine, Ser: 1.51 mg/dL — ABNORMAL HIGH (ref 0.61–1.24)
GFR calc Af Amer: 45 mL/min — ABNORMAL LOW (ref >60)
GFR, EST NON AFRICAN AMERICAN: 39 mL/min — AB (ref >60)
Glucose, Bld: 108 mg/dL — ABNORMAL HIGH (ref 65–99)
POTASSIUM: 4.5 mmol/L (ref 3.5–5.1)
Sodium: 138 mmol/L (ref 135–145)
TOTAL PROTEIN: 6.5 g/dL (ref 6.5–8.1)

## 2015-10-02 LAB — GLUCOSE, CAPILLARY
GLUCOSE-CAPILLARY: 101 mg/dL — AB (ref 65–99)
GLUCOSE-CAPILLARY: 106 mg/dL — AB (ref 65–99)
GLUCOSE-CAPILLARY: 97 mg/dL (ref 65–99)
Glucose-Capillary: 119 mg/dL — ABNORMAL HIGH (ref 65–99)
Glucose-Capillary: 120 mg/dL — ABNORMAL HIGH (ref 65–99)
Glucose-Capillary: 116 mg/dL — ABNORMAL HIGH (ref 65–99)
Glucose-Capillary: 119 mg/dL — ABNORMAL HIGH (ref 65–99)

## 2015-10-02 MED ORDER — METRONIDAZOLE 500 MG PO TABS: 500.0000 mg | ORAL_TABLET | Freq: Three times a day (TID) | ORAL | Status: AC

## 2015-10-02 MED ORDER — CIPROFLOXACIN HCL 500 MG PO TABS: 500.0000 mg | ORAL_TABLET | Freq: Two times a day (BID) | ORAL | Status: AC

## 2015-10-02 MED ORDER — ENOXAPARIN SODIUM 30 MG/0.3ML ~~LOC~~ SOLN
30.0000 mg | SUBCUTANEOUS | Status: DC
  Administered 2015-10-03: 30 mg via SUBCUTANEOUS
  Filled 2015-10-02: qty 0.3

## 2015-10-02 MED ORDER — SODIUM CHLORIDE 0.9 % IV SOLN
INTRAVENOUS | Status: DC
  Administered 2015-10-02: 13:00:00 via INTRAVENOUS

## 2015-10-02 NOTE — Progress Notes (Addendum)
Pt was found lethargic by IV team. RN assessed pt, pt responds to pain. VSS, CBG 104. Rapid team consulted. Will continue to monitor.   12:13 AM pt is alert and oriented to self, place and situation. Luis Norton NT walked him to bathroom and back. Pt is now resting in bed and in no distress. Will continue to monitor   Luis PippinWenjun N Auden Norton

## 2015-10-02 NOTE — Progress Notes (Addendum)
Physician  Progress note   Young Mulvey MRN: [id number] DOB/AGE: 1922-03-17 82 y.o.  PCP: Kandice Hams, MD           Principal Problem:   Colitis Active Problems:   Vomiting and diarrhea   ARF (acute renal failure) (HCC)    Follow-up recommendations Follow-up with PCP in 3-5 days , including all  additional recommended appointments as below Follow-up CBC, CMP in 3-5 days May need outpt colonoscopy in 4-6 weeks to be arranged for by PCP       Medication List    TAKE these medications        bisacodyl 5 MG EC tablet  Commonly known as:  DULCOLAX  1 tab q hs Prn constipation     ciprofloxacin 500 MG tablet  Commonly known as:  CIPRO  Take 1 tablet (500 mg total) by mouth 2 (two) times daily.     metoprolol succinate 25 MG 24 hr tablet  Commonly known as:  TOPROL-XL  Take 1 tablet (25 mg total) by mouth daily.     metroNIDAZOLE 500 MG tablet  Commonly known as:  FLAGYL  Take 1 tablet (500 mg total) by mouth 3 (three) times daily.     pantoprazole 40 MG tablet  Commonly known as:  PROTONIX  Take 1 tablet (40 mg total) by mouth 2 (two) times daily. Switch for any other PPI at similar dose and frequency     polyethylene glycol powder powder  Commonly known as:  GLYCOLAX/MIRALAX  Take 255 g by mouth daily as needed.     traMADol 50 MG tablet  Commonly known as:  ULTRAM  Take 0.5-1 tablets (25-50 mg total) by mouth every 6 (six) hours as needed for severe pain.         Discharge Condition: *stable  Discharge Instructions         Disposition: 04-Intermediate Care Facility   Consults:  None    Significant Diagnostic Studies:  Ct Abdomen Pelvis W Contrast  09/29/2015  CLINICAL DATA:  82 year old male with umbilical pain and nausea, vomiting and diarrhea, onset yesterday. EXAM: CT ABDOMEN AND PELVIS WITH CONTRAST TECHNIQUE: Multidetector CT imaging of the abdomen and pelvis was performed using the standard protocol following bolus  administration of intravenous contrast. CONTRAST:  5m OMNIPAQUE IOHEXOL 300 MG/ML  SOLN COMPARISON:  CT 05/24/2015 FINDINGS: Lower chest: No consolidation. Cardiomegaly is unchanged. Coronary artery calcifications are seen. Calcified granuloma at the right lung base. Liver: No focal lesion Hepatobiliary: Gallbladder physiologically distended, no calcified stone. No biliary dilatation. Pancreas: No ductal dilatation or inflammation. Spleen: Normal. Adrenal glands: No nodule. Kidneys: Symmetric renal enhancement. No hydronephrosis. Large left renal cyst measuring at least 7.8 cm is again seen. Questionable punctate nonobstructing left nephrolithiasis. Stomach/Bowel: Stomach physiologically distended with ingested contents. Lack of enteric contrast limits bowel evaluation. Fluid-filled normal caliber small bowel throughout the abdomen. Liquid stool suspected in the colon with colonic wall thickening involving the ascending and descending colon. Mild gaseous distention of transverse colon which appears tortuous. Appendix tentatively identified and normal. Vascular/Lymphatic: No retroperitoneal adenopathy. Abdominal aorta is normal in caliber. Atherosclerosis and tortuosity of the abdominal aorta. 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 Reproductive: Heterogeneous enlarged prostate gland causing mass effect on the bladder base. Bladder: Physiologically distended, mild chronic wall thickening. Other: No free air, free fluid, or intra-abdominal fluid collection. Resolution of previous inguinal edema post hernia repair. No recurrent hernia. Musculoskeletal: There  are no acute or suspicious osseous abnormalities. Scoliosis and degenerative change throughout spine. IMPRESSION: 1. Bowel wall thickening of the ascending and descending colon, with liquid stool suspected, most consistent with diarrheal illness, mild colitis. No small bowel dilatation. 2. Stable chronic  findings as described. Electronically Signed   By: Jeb Levering M.D.   On: 09/29/2015 21:43        Filed Weights   09/30/15 0000  Weight: 65 kg (143 lb 4.8 oz)     Microbiology: Recent Results (from the past 240 hour(s))  C difficile quick scan w PCR reflex     Status: None   Collection Time: 09/30/15  8:49 AM  Result Value Ref Range Status   C Diff antigen NEGATIVE NEGATIVE Final   C Diff toxin NEGATIVE NEGATIVE Final   C Diff interpretation Negative for toxigenic C. difficile  Final       Blood Culture    Component Value Date/Time   SDES BLOOD LEFT HAND 05/24/2015 1856   SPECREQUEST IN PEDIATRIC BOTTLE 2CC 05/24/2015 1856   CULT NO GROWTH 5 DAYS 05/24/2015 1856   REPTSTATUS 05/29/2015 FINAL 05/24/2015 1856      Labs: Results for orders placed or performed during the hospital encounter of 09/29/15 (from the past 48 hour(s))  Glucose, capillary     Status: None   Collection Time: 09/30/15  6:04 PM  Result Value Ref Range   Glucose-Capillary 96 65 - 99 mg/dL   Comment 1 Notify RN    Comment 2 Document in Chart   Glucose, capillary     Status: None   Collection Time: 10/01/15 12:20 AM  Result Value Ref Range   Glucose-Capillary 87 65 - 99 mg/dL   Comment 1 Notify RN    Comment 2 Document in Chart   Basic metabolic panel     Status: Abnormal   Collection Time: 10/01/15  5:40 AM  Result Value Ref Range   Sodium 137 135 - 145 mmol/L   Potassium 3.8 3.5 - 5.1 mmol/L   Chloride 99 (L) 101 - 111 mmol/L   CO2 29 22 - 32 mmol/L   Glucose, Bld 120 (H) 65 - 99 mg/dL   BUN 10 6 - 20 mg/dL   Creatinine, Ser 1.10 0.61 - 1.24 mg/dL   Calcium 9.1 8.9 - 10.3 mg/dL   GFR calc non Af Amer 57 (L) >60 mL/min   GFR calc Af Amer >60 >60 mL/min    Comment: (NOTE) The eGFR has been calculated using the CKD EPI equation. This calculation has not been validated in all clinical situations. eGFR's persistently <60 mL/min signify possible Chronic Kidney Disease.    Anion gap  9 5 - 15  CBC     Status: Abnormal   Collection Time: 10/01/15  5:40 AM  Result Value Ref Range   WBC 13.3 (H) 4.0 - 10.5 K/uL   RBC 5.14 4.22 - 5.81 MIL/uL   Hemoglobin 12.5 (L) 13.0 - 17.0 g/dL   HCT 40.6 39.0 - 52.0 %   MCV 79.0 78.0 - 100.0 fL   MCH 24.3 (L) 26.0 - 34.0 pg   MCHC 30.8 30.0 - 36.0 g/dL   RDW 16.4 (H) 11.5 - 15.5 %   Platelets 338 150 - 400 K/uL  Glucose, capillary     Status: Abnormal   Collection Time: 10/01/15  6:00 AM  Result Value Ref Range   Glucose-Capillary 106 (H) 65 - 99 mg/dL   Comment 1 Notify RN  Comment 2 Document in Chart   Glucose, capillary     Status: Abnormal   Collection Time: 10/01/15 11:50 AM  Result Value Ref Range   Glucose-Capillary 119 (H) 65 - 99 mg/dL  Glucose, capillary     Status: Abnormal   Collection Time: 10/01/15  4:46 PM  Result Value Ref Range   Glucose-Capillary 146 (H) 65 - 99 mg/dL  Glucose, capillary     Status: None   Collection Time: 10/02/15 12:13 AM  Result Value Ref Range   Glucose-Capillary 97 65 - 99 mg/dL   Comment 1 Notify RN    Comment 2 Document in Chart   Glucose, capillary     Status: Abnormal   Collection Time: 10/02/15 12:58 AM  Result Value Ref Range   Glucose-Capillary 101 (H) 65 - 99 mg/dL  Comprehensive metabolic panel     Status: Abnormal   Collection Time: 10/02/15  5:58 AM  Result Value Ref Range   Sodium 138 135 - 145 mmol/L   Potassium 4.5 3.5 - 5.1 mmol/L   Chloride 102 101 - 111 mmol/L   CO2 29 22 - 32 mmol/L   Glucose, Bld 108 (H) 65 - 99 mg/dL   BUN 12 6 - 20 mg/dL   Creatinine, Ser 1.51 (H) 0.61 - 1.24 mg/dL   Calcium 8.7 (L) 8.9 - 10.3 mg/dL   Total Protein 6.5 6.5 - 8.1 g/dL   Albumin 3.1 (L) 3.5 - 5.0 g/dL   AST 19 15 - 41 U/L   ALT 9 (L) 17 - 63 U/L   Alkaline Phosphatase 114 38 - 126 U/L   Total Bilirubin 0.5 0.3 - 1.2 mg/dL   GFR calc non Af Amer 39 (L) >60 mL/min   GFR calc Af Amer 45 (L) >60 mL/min    Comment: (NOTE) The eGFR has been calculated using the CKD EPI  equation. This calculation has not been validated in all clinical situations. eGFR's persistently <60 mL/min signify possible Chronic Kidney Disease.    Anion gap 7 5 - 15  Glucose, capillary     Status: Abnormal   Collection Time: 10/02/15  5:58 AM  Result Value Ref Range   Glucose-Capillary 119 (H) 65 - 99 mg/dL  Glucose, capillary     Status: Abnormal   Collection Time: 10/02/15 12:06 PM  Result Value Ref Range   Glucose-Capillary 120 (H) 65 - 99 mg/dL     Lipid Panel  No results found for: CHOL, TRIG, HDL, CHOLHDL, VLDL, LDLCALC, LDLDIRECT   No results found for: HGBA1C   Lab Results  Component Value Date   CREATININE 1.51* 10/02/2015     Luis Norton is a 82 y.o. male with history of paroxysmal atrial fibrillation and hypertension presently on no medications was brought to the ER because of nausea vomiting and diarrhea. As per the patient he has been having some nausea and vomiting last 3 days with diarrhea. Patient also had some periumbilical discomfort. CT scan shows colitis involving the ascending and descending colon. Patient was admitted in September for acute renal failure and at that time also had GI bleed with EGD showing ulcerative esophagitis. Prior to that patient had surgery for incarcerated hernia.    Assessment/Plan:  Acute Colitis -improving, likely infectious vs ischemic, lactic acid improving and reassuring   C. Difficile negative  -started Empiric Flagyl and Cipro, IVF, switched to PO, cont for another 6 days  Advanced to soft diet , if tolerated dc home in am  May need  outpt colonoscopy in 4-6 weeks to be arranged for by PCP    2. Acute renal failure resolved  -pre-renal due GI losses,   Creatinine fluctuationg despite IVF , repeat labs in am  . 3. History of paroxysmal atrial fibrillation  -rate controlled in NSR now, not on anticoagulation secondary to history of GI bleed and Fall risk. Chads 2 vasc score is 3.  4. 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  -stable  5. History of ulcerative esophagitis in September last year as per EGD.  -resume PPI, if Cdiff ruled out    Discharge Exam:    Blood pressure 123/88, pulse 109, temperature 97.3 F (36.3 C), temperature source Oral, resp. rate 18, height '5\' 7"'$  (1.702 m), weight 65 kg (143 lb 4.8 oz), SpO2 91 %.   General: AAO to self and place only, didn't know month/year  Cardiovascular: S1S2/RRR  Respiratory: CTAB  Abdomen: soft, Mildly distended, BS present   Musculoskeletal: no edema c/c    Follow-up Information    Follow up with Kandice Hams, MD. Schedule an appointment as soon as possible for a visit in 3 days.   Specialty:  Internal Medicine   Why:  recommend follow up colonoscopy   Contact information:   [address] [place] [address] [phone]       Signed: Reyne Dumas 10/02/2015, 12:43 PM        Time spent >45 mins

## 2015-10-02 NOTE — Progress Notes (Signed)
Physical Therapy Treatment Patient Details Name: Luis Norton MRN: [id number] DOB: [birth year] Today's Date: 10/02/2015    History of Present Illness Pt adm with N/V due to colitis. PMH - dementia, GI bleed, A-fib    PT Comments    Pt doing well with mobility and should be able to return home with son from PT standpoint.  Follow Up Recommendations  No PT follow up     Equipment Recommendations  None recommended by PT    Recommendations for Other Services       Precautions / Restrictions Precautions Precautions: Fall Restrictions Weight Bearing Restrictions: No    Mobility  Bed Mobility Overal bed mobility: Modified Independent                Transfers Overall transfer level: Modified independent Equipment used: Rolling walker (2 wheeled)   Sit to Stand: Modified independent (Device/Increase time)            Ambulation/Gait Ambulation/Gait assistance: Supervision Ambulation Distance (Feet): 350 Feet Assistive device: Rolling walker (2 wheeled) Gait Pattern/deviations: Step-through pattern;Decreased stride length   Gait velocity interpretation: <1.8 ft/sec, indicative of risk for recurrent falls General Gait Details: No loss of balance using walker   Stairs            Wheelchair Mobility    Modified Rankin (Stroke Patients Only)       Balance Overall balance assessment: Needs assistance Sitting-balance support: No upper extremity supported;Feet supported Sitting balance-Leahy Scale: Good     Standing balance support: No upper extremity supported Standing balance-Leahy Scale: Fair                      Cognition Arousal/Alertness: Awake/alert Behavior During Therapy: WFL for tasks assessed/performed Overall Cognitive Status: History of cognitive impairments - at baseline                      Exercises      General Comments        Pertinent Vitals/Pain      Home Living                      Prior  Function            PT Goals (current goals can now be found in the care plan section) Progress towards PT goals: Progressing toward goals    Frequency  Min 3X/week    PT Plan Current plan remains appropriate    Co-evaluation             End of Session Equipment Utilized During Treatment: Gait belt Activity Tolerance: Patient tolerated treatment well Patient left: in chair;with call bell/phone within reach;with chair alarm set     Time: 1425-1435 PT Time Calculation (min) (ACUTE ONLY): 10 min  Charges:  $Gait Training: 8-22 mins                    G Codes:      Jacquan Savas 10/02/2015, 3:20 PM [employer] CorporationCary Zael Shuman PT [phone]

## 2015-10-03 DIAGNOSIS — K529 Noninfective gastroenteritis and colitis, unspecified: Secondary | ICD-10-CM | POA: Diagnosis not present

## 2015-10-03 LAB — CBC
HEMATOCRIT: 33.3 % — AB (ref 39.0–52.0)
Hemoglobin: 10.2 g/dL — ABNORMAL LOW (ref 13.0–17.0)
MCH: 24.2 pg — AB (ref 26.0–34.0)
MCHC: 30.6 g/dL (ref 30.0–36.0)
MCV: 78.9 fL (ref 78.0–100.0)
PLATELETS: 270 10*3/uL (ref 150–400)
RBC: 4.22 MIL/uL (ref 4.22–5.81)
RDW: 16.5 % — AB (ref 11.5–15.5)
WBC: 8.8 10*3/uL (ref 4.0–10.5)

## 2015-10-03 LAB — COMPREHENSIVE METABOLIC PANEL
ALT: 11 U/L — AB (ref 17–63)
AST: 20 U/L (ref 15–41)
Albumin: 2.7 g/dL — ABNORMAL LOW (ref 3.5–5.0)
Alkaline Phosphatase: 98 U/L (ref 38–126)
Anion gap: 5 (ref 5–15)
BILIRUBIN TOTAL: 0.4 mg/dL (ref 0.3–1.2)
BUN: 14 mg/dL (ref 6–20)
CHLORIDE: 106 mmol/L (ref 101–111)
CO2: 26 mmol/L (ref 22–32)
CREATININE: 1.12 mg/dL (ref 0.61–1.24)
Calcium: 8.3 mg/dL — ABNORMAL LOW (ref 8.9–10.3)
GFR, EST NON AFRICAN AMERICAN: 55 mL/min — AB (ref >60)
Glucose, Bld: 99 mg/dL (ref 65–99)
POTASSIUM: 4.3 mmol/L (ref 3.5–5.1)
Sodium: 137 mmol/L (ref 135–145)
TOTAL PROTEIN: 5.8 g/dL — AB (ref 6.5–8.1)

## 2015-10-03 NOTE — Progress Notes (Signed)
Nsg Discharge Note  Admit Date:  09/29/2015 Discharge date: 10/03/2015   [name] to be D/C'd Home per MD order.  AVS completed.  Copy for chart, and copy for patient signed, and dated. Patient/caregiver able to verbalize understanding.  Discharge Medication:   Medication List    TAKE these medications        bisacodyl 5 MG EC tablet  Commonly known as:  DULCOLAX  1 tab q hs Prn constipation     ciprofloxacin 500 MG tablet  Commonly known as:  CIPRO  Take 1 tablet (500 mg total) by mouth 2 (two) times daily.     metoprolol succinate 25 MG 24 hr tablet  Commonly known as:  TOPROL-XL  Take 1 tablet (25 mg total) by mouth daily.     metroNIDAZOLE 500 MG tablet  Commonly known as:  FLAGYL  Take 1 tablet (500 mg total) by mouth 3 (three) times daily.     pantoprazole 40 MG tablet  Commonly known as:  PROTONIX  Take 1 tablet (40 mg total) by mouth 2 (two) times daily. Switch for any other PPI at similar dose and frequency     polyethylene glycol powder powder  Commonly known as:  GLYCOLAX/MIRALAX  Take 255 g by mouth daily as needed.     traMADol 50 MG tablet  Commonly known as:  ULTRAM  Take 0.5-1 tablets (25-50 mg total) by mouth every 6 (six) hours as needed for severe pain.        Discharge Assessment: Filed Vitals:   10/02/15 2155 10/03/15 0544  BP: 140/85 117/81  Pulse: 106 67  Temp: 97.8 F (36.6 C) 98.7 F (37.1 C)  Resp: 16 19   Skin clean, dry and intact without evidence of skin break down, no evidence of skin tears noted. IV catheter discontinued intact. Site without signs and symptoms of complications - no redness or edema noted at insertion site, patient denies c/o pain - only slight tenderness at site.  Dressing with slight pressure applied.  D/c Instructions-Education: Discharge instructions given to patient/family with verbalized understanding. D/c education completed with patient/family including follow up instructions, medication list, d/c  activities limitations if indicated, with other d/c instructions as indicated by MD - patient able to verbalize understanding, all questions fully answered. Patient instructed to return to ED, call 911, or call MD for any changes in condition.  Patient escorted via WC, and D/C home via private auto.  [name], RN 10/03/2015 1:18 PM

## 2015-10-03 NOTE — Discharge Summary (Signed)
Physician Discharge Summary  Luis Norton MRN: [id number] DOB/AGE: November 14, 1921 82 y.o.  PCP: Kandice Hams, MD   Admit date: 09/29/2015 Discharge date: 10/03/2015  Discharge Diagnoses:    Principal Problem:   Colitis Active Problems:   Vomiting and diarrhea   ARF (acute renal failure) (HCC)     Follow-up recommendations Follow-up with PCP in 3-5 days , including all  additional recommended appointments as below Follow-up CBC, CMP in 3-5 days May need outpt colonoscopy in 4-6 weeks to be arranged for by PCP       Medication List    TAKE these medications        bisacodyl 5 MG EC tablet  Commonly known as:  DULCOLAX  1 tab q hs Prn constipation     ciprofloxacin 500 MG tablet  Commonly known as:  CIPRO  Take 1 tablet (500 mg total) by mouth 2 (two) times daily.     metoprolol succinate 25 MG 24 hr tablet  Commonly known as:  TOPROL-XL  Take 1 tablet (25 mg total) by mouth daily.     metroNIDAZOLE 500 MG tablet  Commonly known as:  FLAGYL  Take 1 tablet (500 mg total) by mouth 3 (three) times daily.     pantoprazole 40 MG tablet  Commonly known as:  PROTONIX  Take 1 tablet (40 mg total) by mouth 2 (two) times daily. Switch for any other PPI at similar dose and frequency     polyethylene glycol powder powder  Commonly known as:  GLYCOLAX/MIRALAX  Take 255 g by mouth daily as needed.     traMADol 50 MG tablet  Commonly known as:  ULTRAM  Take 0.5-1 tablets (25-50 mg total) by mouth every 6 (six) hours as needed for severe pain.         Discharge Condition: *stable  Discharge Instructions         Disposition: 04-Intermediate Care Facility   Consults:  None    Significant Diagnostic Studies:  Ct Abdomen Pelvis W Contrast  09/29/2015  CLINICAL DATA:  82 year old male with umbilical pain and nausea, vomiting and diarrhea, onset yesterday. EXAM: CT ABDOMEN AND PELVIS WITH CONTRAST TECHNIQUE: Multidetector CT imaging of the abdomen and  pelvis was performed using the standard protocol following bolus administration of intravenous contrast. CONTRAST:  23m OMNIPAQUE IOHEXOL 300 MG/ML  SOLN COMPARISON:  CT 05/24/2015 FINDINGS: Lower chest: No consolidation. Cardiomegaly is unchanged. Coronary artery calcifications are seen. Calcified granuloma at the right lung base. Liver: No focal lesion Hepatobiliary: Gallbladder physiologically distended, no calcified stone. No biliary dilatation. Pancreas: No ductal dilatation or inflammation. Spleen: Normal. Adrenal glands: No nodule. Kidneys: Symmetric renal enhancement. No hydronephrosis. Large left renal cyst measuring at least 7.8 cm is again seen. Questionable punctate nonobstructing left nephrolithiasis. Stomach/Bowel: Stomach physiologically distended with ingested contents. Lack of enteric contrast limits bowel evaluation. Fluid-filled normal caliber small bowel throughout the abdomen. Liquid stool suspected in the colon with colonic wall thickening involving the ascending and descending colon. Mild gaseous distention of transverse colon which appears tortuous. Appendix tentatively identified and normal. Vascular/Lymphatic: No retroperitoneal adenopathy. Abdominal aorta is normal in caliber. Atherosclerosis and tortuosity of the abdominal aorta. 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 Reproductive: Heterogeneous enlarged prostate gland causing mass effect on the bladder base. Bladder: Physiologically distended, mild chronic wall thickening. Other: No free air, free fluid, or intra-abdominal fluid collection. Resolution of previous inguinal edema post hernia repair. No recurrent  hernia. Musculoskeletal: There are no acute or suspicious osseous abnormalities. Scoliosis and degenerative change throughout spine. IMPRESSION: 1. Bowel wall thickening of the ascending and descending colon, with liquid stool suspected, most consistent with diarrheal  illness, mild colitis. No small bowel dilatation. 2. Stable chronic findings as described. Electronically Signed   By: Jeb Levering M.D.   On: 09/29/2015 21:43        Filed Weights   09/30/15 0000  Weight: 65 kg (143 lb 4.8 oz)     Microbiology: Recent Results (from the past 240 hour(s))  C difficile quick scan w PCR reflex     Status: None   Collection Time: 09/30/15  8:49 AM  Result Value Ref Range Status   C Diff antigen NEGATIVE NEGATIVE Final   C Diff toxin NEGATIVE NEGATIVE Final   C Diff interpretation Negative for toxigenic C. difficile  Final       Blood Culture    Component Value Date/Time   SDES BLOOD LEFT HAND 05/24/2015 1856   SPECREQUEST IN PEDIATRIC BOTTLE [facility] 05/24/2015 1856   CULT NO GROWTH 5 DAYS 05/24/2015 1856   REPTSTATUS 05/29/2015 FINAL 05/24/2015 1856      Labs: Results for orders placed or performed during the hospital encounter of 09/29/15 (from the past 48 hour(s))  Glucose, capillary     Status: Abnormal   Collection Time: 10/01/15 11:50 AM  Result Value Ref Range   Glucose-Capillary 119 (H) 65 - 99 mg/dL  Glucose, capillary     Status: Abnormal   Collection Time: 10/01/15  4:46 PM  Result Value Ref Range   Glucose-Capillary 146 (H) 65 - 99 mg/dL  Glucose, capillary     Status: None   Collection Time: 10/02/15 12:13 AM  Result Value Ref Range   Glucose-Capillary 97 65 - 99 mg/dL   Comment 1 Notify RN    Comment 2 Document in Chart   Glucose, capillary     Status: Abnormal   Collection Time: 10/02/15 12:58 AM  Result Value Ref Range   Glucose-Capillary 101 (H) 65 - 99 mg/dL  Comprehensive metabolic panel     Status: Abnormal   Collection Time: 10/02/15  5:58 AM  Result Value Ref Range   Sodium 138 135 - 145 mmol/L   Potassium 4.5 3.5 - 5.1 mmol/L   Chloride 102 101 - 111 mmol/L   CO2 29 22 - 32 mmol/L   Glucose, Bld 108 (H) 65 - 99 mg/dL   BUN 12 6 - 20 mg/dL   Creatinine, Ser 1.51 (H) 0.61 - 1.24 mg/dL   Calcium 8.7 (L)  8.9 - 10.3 mg/dL   Total Protein 6.5 6.5 - 8.1 g/dL   Albumin 3.1 (L) 3.5 - 5.0 g/dL   AST 19 15 - 41 U/L   ALT 9 (L) 17 - 63 U/L   Alkaline Phosphatase 114 38 - 126 U/L   Total Bilirubin 0.5 0.3 - 1.2 mg/dL   GFR calc non Af Amer 39 (L) >60 mL/min   GFR calc Af Amer 45 (L) >60 mL/min    Comment: (NOTE) The eGFR has been calculated using the CKD EPI equation. This calculation has not been validated in all clinical situations. eGFR's persistently <60 mL/min signify possible Chronic Kidney Disease.    Anion gap 7 5 - 15  Glucose, capillary     Status: Abnormal   Collection Time: 10/02/15  5:58 AM  Result Value Ref Range   Glucose-Capillary 119 (H) 65 - 99 mg/dL  Glucose,  capillary     Status: Abnormal   Collection Time: 10/02/15 12:06 PM  Result Value Ref Range   Glucose-Capillary 120 (H) 65 - 99 mg/dL  Glucose, capillary     Status: Abnormal   Collection Time: 10/02/15  5:13 PM  Result Value Ref Range   Glucose-Capillary 116 (H) 65 - 99 mg/dL  Glucose, capillary     Status: Abnormal   Collection Time: 10/02/15 10:00 PM  Result Value Ref Range   Glucose-Capillary 106 (H) 65 - 99 mg/dL  Glucose, capillary     Status: Abnormal   Collection Time: 10/02/15 11:06 PM  Result Value Ref Range   Glucose-Capillary 119 (H) 65 - 99 mg/dL  CBC     Status: Abnormal   Collection Time: 10/03/15  6:10 AM  Result Value Ref Range   WBC 8.8 4.0 - 10.5 K/uL   RBC 4.22 4.22 - 5.81 MIL/uL   Hemoglobin 10.2 (L) 13.0 - 17.0 g/dL   HCT 33.3 (L) 39.0 - 52.0 %   MCV 78.9 78.0 - 100.0 fL   MCH 24.2 (L) 26.0 - 34.0 pg   MCHC 30.6 30.0 - 36.0 g/dL   RDW 16.5 (H) 11.5 - 15.5 %   Platelets 270 150 - 400 K/uL  Comprehensive metabolic panel     Status: Abnormal   Collection Time: 10/03/15  6:10 AM  Result Value Ref Range   Sodium 137 135 - 145 mmol/L   Potassium 4.3 3.5 - 5.1 mmol/L   Chloride 106 101 - 111 mmol/L   CO2 26 22 - 32 mmol/L   Glucose, Bld 99 65 - 99 mg/dL   BUN 14 6 - 20 mg/dL    Creatinine, Ser 1.12 0.61 - 1.24 mg/dL   Calcium 8.3 (L) 8.9 - 10.3 mg/dL   Total Protein 5.8 (L) 6.5 - 8.1 g/dL   Albumin 2.7 (L) 3.5 - 5.0 g/dL   AST 20 15 - 41 U/L   ALT 11 (L) 17 - 63 U/L   Alkaline Phosphatase 98 38 - 126 U/L   Total Bilirubin 0.4 0.3 - 1.2 mg/dL   GFR calc non Af Amer 55 (L) >60 mL/min   GFR calc Af Amer >60 >60 mL/min    Comment: (NOTE) The eGFR has been calculated using the CKD EPI equation. This calculation has not been validated in all clinical situations. eGFR's persistently <60 mL/min signify possible Chronic Kidney Disease.    Anion gap 5 5 - 15     Lipid Panel  No results found for: CHOL, TRIG, HDL, CHOLHDL, VLDL, LDLCALC, LDLDIRECT   No results found for: HGBA1C   Lab Results  Component Value Date   CREATININE 1.12 10/03/2015     Luis Norton is a 82 y.o. male with history of paroxysmal atrial fibrillation and hypertension presently on no medications was brought to the ER because of nausea vomiting and diarrhea. As per the patient he has been having some nausea and vomiting last 3 days with diarrhea. Patient also had some periumbilical discomfort. CT scan shows colitis involving the ascending and descending colon. Patient was admitted in September for acute renal failure and at that time also had GI bleed with EGD showing ulcerative esophagitis. Prior to that patient had surgery for incarcerated hernia.    Assessment/Plan:  Acute Colitis -improving, likely infectious vs ischemic, lactic acid improving and reassuring   C. Difficile negative  -started Empiric Flagyl and Cipro, IVF, switched to PO, cont for another 6 days, received 4 days  of antibiotics in the hospital Patient tolerated soft diet 2 days, requesting profusely to go home today May need outpt colonoscopy in 4-6 weeks to be arranged for by PCP    2. Acute renal failure resolved  -pre-renal due GI losses,   Creatinine fluctuationg despite IVF , repeat labs in am   . 3. History of paroxysmal atrial fibrillation  -rate controlled in NSR now, not on anticoagulation secondary to history of GI bleed and Fall risk. Chads 2 vasc score is 3.  4. 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  -stable  5. History of ulcerative esophagitis in September last year as per EGD.  -resume PPI, if Cdiff ruled out    Discharge Exam:    Blood pressure 117/81, pulse 67, temperature 98.7 F (37.1 C), temperature source Oral, resp. rate 19, height _0  (1.702 m), weight 65 kg (143 lb 4.8 oz), SpO2 97 %.   General: AAO to self and place only, didn't know month/year  Cardiovascular: S1S2/RRR  Respiratory: CTAB  Abdomen: soft, Mildly distended, BS present   Musculoskeletal: no edema c/c    Follow-up Information    Follow up with Kandice Hams, MD. Schedule an appointment as soon as possible for a visit on 10/05/2015.   Specialty:  Internal Medicine   Why:  recommend follow up colonoscopy. Scheduler stated that patient has a "blocked" account, patient must take care of outstanding balance before APPT can be scheduled. NS alert RN of barrier on 10/02/15 @ 1313.   Contact information:   [address] [place] [address] [phone]       Signed: Reyne Dumas 10/03/2015, 11:34 AM        Time spent >45 mins

## 2015-10-05 ENCOUNTER — Other Ambulatory Visit: Payer: Self-pay | Admitting: *Deleted

## 2015-10-05 NOTE — Patient Outreach (Signed)
Referral received from hospital liaison to initiate transition of care program once member was discharged.  Member recently admitted to hospital for colitis, discharged Saturday, 1/14.  According to chart, he also has a history of hypertension, atrial fibrillation, and acute kidney injury.  Call placed to member to initiate program, no answer at preferred number listed in chart [phone]).  HIPPA compliant voice message left, will await call back.  Will make second attempt to contact member tomorrow.  Kemper DurieMonica Yohance Hathorne, BSN, [facility] Center[facility] Care Management  [facility] Care Manager [phone]

## 2015-10-07 ENCOUNTER — Emergency Department (HOSPITAL_COMMUNITY): Payer: Commercial Managed Care - HMO

## 2015-10-07 ENCOUNTER — Encounter (HOSPITAL_COMMUNITY): Payer: Self-pay | Admitting: Emergency Medicine

## 2015-10-07 ENCOUNTER — Inpatient Hospital Stay (HOSPITAL_COMMUNITY)
Admission: EM | Admit: 2015-10-07 | Discharge: 2015-10-21 | DRG: 871 | Disposition: E | Payer: Commercial Managed Care - HMO | Attending: Internal Medicine | Admitting: Internal Medicine

## 2015-10-07 ENCOUNTER — Other Ambulatory Visit: Payer: Self-pay | Admitting: *Deleted

## 2015-10-07 DIAGNOSIS — N179 Acute kidney failure, unspecified: Secondary | ICD-10-CM | POA: Diagnosis present

## 2015-10-07 DIAGNOSIS — Z8711 Personal history of peptic ulcer disease: Secondary | ICD-10-CM | POA: Diagnosis not present

## 2015-10-07 DIAGNOSIS — I480 Paroxysmal atrial fibrillation: Secondary | ICD-10-CM | POA: Diagnosis present

## 2015-10-07 DIAGNOSIS — R109 Unspecified abdominal pain: Secondary | ICD-10-CM | POA: Diagnosis present

## 2015-10-07 DIAGNOSIS — E872 Acidosis: Secondary | ICD-10-CM | POA: Diagnosis present

## 2015-10-07 DIAGNOSIS — N390 Urinary tract infection, site not specified: Secondary | ICD-10-CM | POA: Diagnosis present

## 2015-10-07 DIAGNOSIS — D72829 Elevated white blood cell count, unspecified: Secondary | ICD-10-CM | POA: Diagnosis present

## 2015-10-07 DIAGNOSIS — Z880 Allergy status to penicillin: Secondary | ICD-10-CM

## 2015-10-07 DIAGNOSIS — E876 Hypokalemia: Secondary | ICD-10-CM | POA: Diagnosis present

## 2015-10-07 DIAGNOSIS — E860 Dehydration: Secondary | ICD-10-CM | POA: Diagnosis present

## 2015-10-07 DIAGNOSIS — D638 Anemia in other chronic diseases classified elsewhere: Secondary | ICD-10-CM | POA: Diagnosis present

## 2015-10-07 DIAGNOSIS — Z515 Encounter for palliative care: Secondary | ICD-10-CM | POA: Diagnosis not present

## 2015-10-07 DIAGNOSIS — E7800 Pure hypercholesterolemia, unspecified: Secondary | ICD-10-CM | POA: Diagnosis present

## 2015-10-07 DIAGNOSIS — E43 Unspecified severe protein-calorie malnutrition: Secondary | ICD-10-CM | POA: Diagnosis present

## 2015-10-07 DIAGNOSIS — I129 Hypertensive chronic kidney disease with stage 1 through stage 4 chronic kidney disease, or unspecified chronic kidney disease: Secondary | ICD-10-CM | POA: Diagnosis present

## 2015-10-07 DIAGNOSIS — Z9189 Other specified personal risk factors, not elsewhere classified: Secondary | ICD-10-CM | POA: Diagnosis not present

## 2015-10-07 DIAGNOSIS — N400 Enlarged prostate without lower urinary tract symptoms: Secondary | ICD-10-CM | POA: Diagnosis present

## 2015-10-07 DIAGNOSIS — D631 Anemia in chronic kidney disease: Secondary | ICD-10-CM | POA: Diagnosis present

## 2015-10-07 DIAGNOSIS — Z79899 Other long term (current) drug therapy: Secondary | ICD-10-CM

## 2015-10-07 DIAGNOSIS — N183 Chronic kidney disease, stage 3 (moderate): Secondary | ICD-10-CM | POA: Diagnosis present

## 2015-10-07 DIAGNOSIS — R1310 Dysphagia, unspecified: Secondary | ICD-10-CM | POA: Diagnosis present

## 2015-10-07 DIAGNOSIS — N529 Male erectile dysfunction, unspecified: Secondary | ICD-10-CM | POA: Diagnosis present

## 2015-10-07 DIAGNOSIS — R1084 Generalized abdominal pain: Secondary | ICD-10-CM | POA: Diagnosis not present

## 2015-10-07 DIAGNOSIS — K529 Noninfective gastroenteritis and colitis, unspecified: Secondary | ICD-10-CM | POA: Diagnosis present

## 2015-10-07 DIAGNOSIS — Z87891 Personal history of nicotine dependence: Secondary | ICD-10-CM

## 2015-10-07 DIAGNOSIS — I10 Essential (primary) hypertension: Secondary | ICD-10-CM | POA: Diagnosis present

## 2015-10-07 DIAGNOSIS — E875 Hyperkalemia: Secondary | ICD-10-CM | POA: Diagnosis not present

## 2015-10-07 DIAGNOSIS — Z8249 Family history of ischemic heart disease and other diseases of the circulatory system: Secondary | ICD-10-CM | POA: Diagnosis not present

## 2015-10-07 DIAGNOSIS — Z66 Do not resuscitate: Secondary | ICD-10-CM | POA: Insufficient documentation

## 2015-10-07 DIAGNOSIS — A419 Sepsis, unspecified organism: Principal | ICD-10-CM | POA: Diagnosis present

## 2015-10-07 LAB — COMPREHENSIVE METABOLIC PANEL
ALBUMIN: 3 g/dL — AB (ref 3.5–5.0)
ALK PHOS: 84 U/L (ref 38–126)
ALT: 13 U/L — AB (ref 17–63)
ALT: 10 U/L — ABNORMAL LOW (ref 17–63)
ANION GAP: 8 (ref 5–15)
AST: 24 U/L (ref 15–41)
AST: 16 U/L (ref 15–41)
Albumin: 3.5 g/dL (ref 3.5–5.0)
Alkaline Phosphatase: 99 U/L (ref 38–126)
Anion gap: 11 (ref 5–15)
BILIRUBIN TOTAL: 0.4 mg/dL (ref 0.3–1.2)
BUN: 25 mg/dL — AB (ref 6–20)
BUN: 22 mg/dL — ABNORMAL HIGH (ref 6–20)
CALCIUM: 8.2 mg/dL — AB (ref 8.9–10.3)
CHLORIDE: 98 mmol/L — AB (ref 101–111)
CHLORIDE: 96 mmol/L — AB (ref 101–111)
CO2: 29 mmol/L (ref 22–32)
CO2: 31 mmol/L (ref 22–32)
CREATININE: 1.5 mg/dL — AB (ref 0.61–1.24)
Calcium: 9.2 mg/dL (ref 8.9–10.3)
Creatinine, Ser: 1.27 mg/dL — ABNORMAL HIGH (ref 0.61–1.24)
GFR calc Af Amer: 55 mL/min — ABNORMAL LOW (ref >60)
GFR calc non Af Amer: 48 mL/min — ABNORMAL LOW (ref >60)
GFR, EST AFRICAN AMERICAN: 45 mL/min — AB (ref >60)
GFR, EST NON AFRICAN AMERICAN: 39 mL/min — AB (ref >60)
GLUCOSE: 145 mg/dL — AB (ref 65–99)
Glucose, Bld: 134 mg/dL — ABNORMAL HIGH (ref 65–99)
POTASSIUM: 3.3 mmol/L — AB (ref 3.5–5.1)
Potassium: 4 mmol/L (ref 3.5–5.1)
SODIUM: 135 mmol/L (ref 135–145)
Sodium: 138 mmol/L (ref 135–145)
TOTAL PROTEIN: 7.1 g/dL (ref 6.5–8.1)
Total Bilirubin: 0.6 mg/dL (ref 0.3–1.2)
Total Protein: 6 g/dL — ABNORMAL LOW (ref 6.5–8.1)

## 2015-10-07 LAB — URINALYSIS, ROUTINE W REFLEX MICROSCOPIC
BILIRUBIN URINE: NEGATIVE
GLUCOSE, UA: NEGATIVE mg/dL
HGB URINE DIPSTICK: NEGATIVE
KETONES UR: NEGATIVE mg/dL
Nitrite: NEGATIVE
PROTEIN: 100 mg/dL — AB
Specific Gravity, Urine: 1.016 (ref 1.005–1.030)
pH: 5 (ref 5.0–8.0)

## 2015-10-07 LAB — CBC WITH DIFFERENTIAL/PLATELET
BASOS ABS: 0 10*3/uL (ref 0.0–0.1)
BASOS PCT: 0 %
Basophils Absolute: 0 10*3/uL (ref 0.0–0.1)
Basophils Relative: 0 %
EOS ABS: 0 10*3/uL (ref 0.0–0.7)
EOS PCT: 0 %
Eosinophils Absolute: 0 10*3/uL (ref 0.0–0.7)
Eosinophils Relative: 0 %
HEMATOCRIT: 38.3 % — AB (ref 39.0–52.0)
HEMATOCRIT: 35.3 % — AB (ref 39.0–52.0)
HEMOGLOBIN: 11.4 g/dL — AB (ref 13.0–17.0)
Hemoglobin: 12.3 g/dL — ABNORMAL LOW (ref 13.0–17.0)
LYMPHS ABS: 0.8 10*3/uL (ref 0.7–4.0)
LYMPHS PCT: 4 %
Lymphocytes Relative: 7 %
Lymphs Abs: 1.4 10*3/uL (ref 0.7–4.0)
MCH: 24.8 pg — AB (ref 26.0–34.0)
MCH: 25 pg — AB (ref 26.0–34.0)
MCHC: 32.1 g/dL (ref 30.0–36.0)
MCHC: 32.3 g/dL (ref 30.0–36.0)
MCV: 77.4 fL — AB (ref 78.0–100.0)
MCV: 77.4 fL — AB (ref 78.0–100.0)
MONO ABS: 0.8 10*3/uL (ref 0.1–1.0)
Monocytes Absolute: 1.8 10*3/uL — ABNORMAL HIGH (ref 0.1–1.0)
Monocytes Relative: 4 %
Monocytes Relative: 10 %
NEUTROS ABS: 17.5 10*3/uL — AB (ref 1.7–7.7)
NEUTROS ABS: 16.2 10*3/uL — AB (ref 1.7–7.7)
NEUTROS PCT: 86 %
Neutrophils Relative %: 89 %
PLATELETS: 345 10*3/uL (ref 150–400)
Platelets: 332 10*3/uL (ref 150–400)
RBC: 4.95 MIL/uL (ref 4.22–5.81)
RBC: 4.56 MIL/uL (ref 4.22–5.81)
RDW: 17.1 % — AB (ref 11.5–15.5)
RDW: 16.8 % — ABNORMAL HIGH (ref 11.5–15.5)
WBC: 19.7 10*3/uL — ABNORMAL HIGH (ref 4.0–10.5)
WBC: 18.9 10*3/uL — AB (ref 4.0–10.5)

## 2015-10-07 LAB — LACTIC ACID, PLASMA
LACTIC ACID, VENOUS: 1.3 mmol/L (ref 0.5–2.0)
Lactic Acid, Venous: 1.7 mmol/L (ref 0.5–2.0)

## 2015-10-07 LAB — URINE MICROSCOPIC-ADD ON

## 2015-10-07 LAB — PROTIME-INR
INR: 1.33 (ref 0.00–1.49)
Prothrombin Time: 16.6 seconds — ABNORMAL HIGH (ref 11.6–15.2)

## 2015-10-07 LAB — MAGNESIUM: Magnesium: 2 mg/dL (ref 1.7–2.4)

## 2015-10-07 LAB — I-STAT CG4 LACTIC ACID, ED: LACTIC ACID, VENOUS: 2.74 mmol/L — AB (ref 0.5–2.0)

## 2015-10-07 LAB — PHOSPHORUS: Phosphorus: 2.9 mg/dL (ref 2.5–4.6)

## 2015-10-07 LAB — PROCALCITONIN: Procalcitonin: 0.12 ng/mL

## 2015-10-07 LAB — LIPASE, BLOOD: LIPASE: 24 U/L (ref 11–51)

## 2015-10-07 LAB — APTT: aPTT: 32 seconds (ref 24–37)

## 2015-10-07 MED ORDER — ACETAMINOPHEN 325 MG PO TABS
650.0000 mg | ORAL_TABLET | Freq: Four times a day (QID) | ORAL | Status: DC | PRN
  Administered 2015-10-08: 650 mg via ORAL
  Filled 2015-10-07: qty 2

## 2015-10-07 MED ORDER — VANCOMYCIN HCL IN DEXTROSE 1-5 GM/200ML-% IV SOLN: 1000.0000 mg | Freq: Once | INTRAVENOUS | Status: DC

## 2015-10-07 MED ORDER — LEVOFLOXACIN IN D5W 750 MG/150ML IV SOLN
750.0000 mg | Freq: Once | INTRAVENOUS | Status: AC
  Administered 2015-10-07: 750 mg via INTRAVENOUS
  Filled 2015-10-07: qty 150

## 2015-10-07 MED ORDER — ONDANSETRON HCL 4 MG/2ML IJ SOLN
4.0000 mg | Freq: Once | INTRAMUSCULAR | Status: AC
  Administered 2015-10-07: 4 mg via INTRAVENOUS
  Filled 2015-10-07: qty 2

## 2015-10-07 MED ORDER — ONDANSETRON HCL 4 MG/2ML IJ SOLN: 4.0000 mg | Freq: Four times a day (QID) | INTRAMUSCULAR | Status: DC | PRN

## 2015-10-07 MED ORDER — METRONIDAZOLE IN NACL 5-0.79 MG/ML-% IV SOLN
500.0000 mg | Freq: Three times a day (TID) | INTRAVENOUS | Status: DC
  Administered 2015-10-07 – 2015-10-11 (×13): 500 mg via INTRAVENOUS
  Filled 2015-10-07 (×15): qty 100

## 2015-10-07 MED ORDER — DEXTROSE 5 % IV SOLN
1.0000 g | Freq: Three times a day (TID) | INTRAVENOUS | Status: DC
  Filled 2015-10-07 (×2): qty 1

## 2015-10-07 MED ORDER — ACETAMINOPHEN 650 MG RE SUPP: 650.0000 mg | Freq: Four times a day (QID) | RECTAL | Status: DC | PRN

## 2015-10-07 MED ORDER — DEXTROSE 5 % IV SOLN
2.0000 g | Freq: Once | INTRAVENOUS | Status: DC
  Filled 2015-10-07: qty 2

## 2015-10-07 MED ORDER — HYDROMORPHONE HCL 1 MG/ML IJ SOLN
1.0000 mg | INTRAMUSCULAR | Status: DC | PRN
  Administered 2015-10-07 – 2015-10-10 (×7): 1 mg via INTRAVENOUS
  Filled 2015-10-07 (×8): qty 1

## 2015-10-07 MED ORDER — ONDANSETRON HCL 4 MG PO TABS
4.0000 mg | ORAL_TABLET | Freq: Four times a day (QID) | ORAL | Status: DC | PRN
Start: 2015-10-07 — End: 2015-10-12

## 2015-10-07 MED ORDER — MORPHINE SULFATE (PF) 2 MG/ML IV SOLN
2.0000 mg | INTRAVENOUS | Status: DC | PRN
  Administered 2015-10-07: 2 mg via INTRAVENOUS
  Filled 2015-10-07: qty 1

## 2015-10-07 MED ORDER — VANCOMYCIN HCL IN DEXTROSE 1-5 GM/200ML-% IV SOLN
1000.0000 mg | Freq: Once | INTRAVENOUS | Status: AC
  Administered 2015-10-07: 1000 mg via INTRAVENOUS
  Filled 2015-10-07: qty 200

## 2015-10-07 MED ORDER — DEXTROSE 5 % IV SOLN
2.0000 g | Freq: Once | INTRAVENOUS | Status: AC
  Administered 2015-10-07: 2 g via INTRAVENOUS
  Filled 2015-10-07: qty 2

## 2015-10-07 MED ORDER — SODIUM CHLORIDE 0.9 % IV SOLN
INTRAVENOUS | Status: DC
  Administered 2015-10-07 – 2015-10-09 (×2): via INTRAVENOUS

## 2015-10-07 MED ORDER — LEVOFLOXACIN IN D5W 750 MG/150ML IV SOLN: 750.0000 mg | INTRAVENOUS | Status: DC

## 2015-10-07 MED ORDER — LEVOFLOXACIN IN D5W 750 MG/150ML IV SOLN: 750.0000 mg | Freq: Once | INTRAVENOUS | Status: DC

## 2015-10-07 MED ORDER — SODIUM CHLORIDE 0.9 % IJ SOLN
3.0000 mL | Freq: Two times a day (BID) | INTRAMUSCULAR | Status: DC
  Administered 2015-10-07 – 2015-10-11 (×3): 3 mL via INTRAVENOUS

## 2015-10-07 MED ORDER — DEXTROSE 5 % IV SOLN
2.0000 g | INTRAVENOUS | Status: DC
  Administered 2015-10-07: 2 g via INTRAVENOUS
  Filled 2015-10-07: qty 2

## 2015-10-07 MED ORDER — MORPHINE SULFATE (PF) 4 MG/ML IV SOLN
4.0000 mg | INTRAVENOUS | Status: DC | PRN
  Administered 2015-10-07: 4 mg via INTRAVENOUS

## 2015-10-07 MED ORDER — IOHEXOL 300 MG/ML  SOLN
50.0000 mL | Freq: Once | INTRAMUSCULAR | Status: DC | PRN
  Administered 2015-10-07: 50 mL via ORAL
  Filled 2015-10-07: qty 50

## 2015-10-07 MED ORDER — VANCOMYCIN HCL IN DEXTROSE 1-5 GM/200ML-% IV SOLN: 1000.0000 mg | INTRAVENOUS | Status: DC

## 2015-10-07 MED ORDER — PANTOPRAZOLE SODIUM 40 MG IV SOLR
40.0000 mg | Freq: Two times a day (BID) | INTRAVENOUS | Status: DC
  Administered 2015-10-07 – 2015-10-11 (×9): 40 mg via INTRAVENOUS
  Filled 2015-10-07 (×11): qty 40

## 2015-10-07 MED ORDER — SODIUM CHLORIDE 0.9 % IV BOLUS (SEPSIS)
1000.0000 mL | INTRAVENOUS | Status: AC
  Administered 2015-10-07 (×2): 1000 mL via INTRAVENOUS

## 2015-10-07 MED ORDER — HYDRALAZINE HCL 20 MG/ML IJ SOLN: 5.0000 mg | Freq: Four times a day (QID) | INTRAMUSCULAR | Status: DC | PRN

## 2015-10-07 MED ORDER — MORPHINE SULFATE (PF) 4 MG/ML IV SOLN
4.0000 mg | INTRAVENOUS | Status: DC | PRN
  Administered 2015-10-07 (×2): 4 mg via INTRAVENOUS
  Filled 2015-10-07 (×3): qty 1

## 2015-10-07 MED ORDER — ONDANSETRON HCL 4 MG/2ML IJ SOLN: 4.0000 mg | Freq: Once | INTRAMUSCULAR | Status: DC

## 2015-10-07 NOTE — Progress Notes (Signed)
Patient noted to have been admitted and discharged from hospital from 01/10 to 01 /14 for colitis.  EDCM spoke to patient at bedside.  Patient noted to be wearing oxygen, patient reports he does not wear oxygen at home.  Patient reports he lives with his son.  Patient reports he is usually abe to wash and dress himself, "My son does most of the cooking."  Patient reports he ambulates with a walker, patient's walker from home is present with him in the ED.  Patient reports he doesn't think he needs any further dme at home.Patient does not have home health services at this time.  Patient is interested in home health services.  EDCM explained home health services to patient.  Patient confirms his pcp is Dr. Nehemiah Settle.  Patient reports he has not seen any doctors since he has been discharged.  Patie t reports he did receive  A discharge follow up phone call from his pcp.  No further EDCM needs at this time.

## 2015-10-07 NOTE — Progress Notes (Signed)
ANTIBIOTIC CONSULT NOTE - INITIAL  Pharmacy Consult for vancomycin, Levaquin, and aztreonam Indication: rule out sepsis  Allergies  Allergen Reactions  . Penicillins Swelling    Has patient had a PCN reaction causing immediate rash, facial/tongue/throat swelling, SOB or lightheadedness with hypotension: YES Has patient had a PCN reaction causing severe rash involving mucus membranes or skin necrosis: NO Has patient had a PCN reaction that required hospitalization NO Has patient had a PCN reaction occurring within the last 10 years: NO If all of the above answers are "NO", then may proceed with Cephalosporin use.    Patient Measurements:    Vital Signs: Temp: 97.9 F (36.6 C) (01/18 1025) Temp Source: Oral (01/18 1025) BP: 138/80 mmHg (01/18 1306) Pulse Rate: 109 (01/18 1306) Intake/Output from previous day:   Intake/Output from this shift:    Labs:  Recent Labs  09/22/2015 1132  WBC 19.7*  HGB 12.3*  PLT 345  CREATININE 1.50*   Estimated Creatinine Clearance: 29.5 mL/min (by C-G formula based on Cr of 1.5). No results for input(s): VANCOTROUGH, VANCOPEAK, VANCORANDOM, GENTTROUGH, GENTPEAK, GENTRANDOM, TOBRATROUGH, TOBRAPEAK, TOBRARND, AMIKACINPEAK, AMIKACINTROU, AMIKACIN in the last 72 hours.   Microbiology: Recent Results (from the past 720 hour(s))  C difficile quick scan w PCR reflex     Status: None   Collection Time: 09/30/15  8:49 AM  Result Value Ref Range Status   C Diff antigen NEGATIVE NEGATIVE Final   C Diff toxin NEGATIVE NEGATIVE Final   C Diff interpretation Negative for toxigenic C. difficile  Final    Medical History: Past Medical History  Diagnosis Date  . Hypertension   . Hypercholesterolemia   . ED (erectile dysfunction)   . Urinary frequency     bladder instablity and outlet obstruction   . Constipation   . PUD (peptic ulcer disease)     history of   . BPH (benign prostatic hyperplasia)   . Atrial fibrillation (HCC)     Medications:   Scheduled:   Infusions:  . aztreonam    . levofloxacin (LEVAQUIN) IV    . sodium chloride    . vancomycin     Assessment: 82 yo male presents to ER with worsening abdominal pain after being discharged from hospital last week for colitis on cipro and flagyl. To start vancomycin, Levaquin and aztreonam per pharmacy for rule out sepsis. Afeb, WBC 19.7, and SCr 1.5 with CrCl 30 ml/min at baseline  Goal of Therapy:  Vancomycin trough level 15-20 mcg/ml  Plan:  1) Vancomycin 1g IV x 1 then 1g IV q24 thereafter per current weight and renal function 2) Levaquin  IV x 1 then  IV q48 for CrCl 20-49 ml/min 3) Aztreonam 2g IV x 1 then 1g IV q8   Hessie Knows, PharmD, BCPS Pager [phone] 09/24/2015 1:52 PM

## 2015-10-07 NOTE — ED Notes (Signed)
Bed: WA04 Expected date:  Expected time:  Means of arrival:  Comments: Ems- abd pain  

## 2015-10-07 NOTE — ED Notes (Signed)
Patient transported to CT 

## 2015-10-07 NOTE — ED Provider Notes (Signed)
CSN: [id number]     Arrival date & time 09/25/2015  1005 History   First MD Initiated Contact with Patient 10/15/2015 1017     Chief Complaint  Patient presents with  . Abdominal Pain    HPI  Patient presents with his son who assists with the history of present illness. He notes that since discharge, 3 days ago, the patient has had worsening abdominal pain, persistent nausea, vomiting. No new fever at home. The pain is intractable, with no relief from anything. Patient had one bowel movement today. No diarrhea. He takes all medication as directed, including Flagyl, other antibiotics, unknown. Patient denies chest pain, dyspnea, confusion, disorientation.   Past Medical History  Diagnosis Date  . Hypertension   . Hypercholesterolemia   . ED (erectile dysfunction)   . Urinary frequency     bladder instablity and outlet obstruction   . Constipation   . PUD (peptic ulcer disease)     history of   . BPH (benign prostatic hyperplasia)   . Atrial fibrillation [facility])    Past Surgical History  Procedure Laterality Date  . Ankle surgery Right   . Inguinal hernia repair Bilateral 05/20/2015    Procedure: HERNIA REPAIR INGUINAL ADULT BILATERAL;  Surgeon: Coralie Keens, MD;  Location: [place];  Service: General;  Laterality: Bilateral;  . Insertion of mesh Bilateral 05/20/2015    Procedure: INSERTION OF MESH;  Surgeon: Coralie Keens, MD;  Location: [place];  Service: General;  Laterality: Bilateral;  . Esophagogastroduodenoscopy N/A 05/26/2015    Procedure: ESOPHAGOGASTRODUODENOSCOPY (EGD);  Surgeon: Clarene Essex, MD;  Location: [facility] ENDOSCOPY;  Service: Endoscopy;  Laterality: N/A;   Family History  Problem Relation Age of Onset  . Hypertension Other    Social History  Substance Use Topics  . Smoking status: Former Research scientist (life sciences)  . Smokeless tobacco: None  . Alcohol Use: No    Review of Systems  Constitutional:       Per HPI, otherwise negative  HENT:       Per HPI, otherwise negative   Respiratory:       Per HPI, otherwise negative  Cardiovascular:       Per HPI, otherwise negative  Gastrointestinal: Positive for nausea and vomiting.  Endocrine:       Negative aside from HPI  Genitourinary:       Neg aside from HPI   Musculoskeletal:       Per HPI, otherwise negative  Skin: Negative.   Neurological: Negative for syncope.      Allergies  Penicillins  Home Medications   Prior to Admission medications   Medication Sig Start Date End Date Taking? Authorizing Provider  bisacodyl (DULCOLAX) 5 MG EC tablet 1 tab q hs Prn constipation Patient not taking: Reported on 09/29/2015 09/23/13   Janne Napoleon, NP  ciprofloxacin (CIPRO) 500 MG tablet Take 1 tablet (500 mg total) by mouth 2 (two) times daily. 10/02/15   Reyne Dumas, MD  metoprolol succinate (TOPROL-XL) 25 MG 24 hr tablet Take 1 tablet (25 mg total) by mouth daily. Patient not taking: Reported on 05/24/2015 05/22/15   Verlee Monte, MD  metroNIDAZOLE (FLAGYL) 500 MG tablet Take 1 tablet (500 mg total) by mouth 3 (three) times daily. 10/02/15   Reyne Dumas, MD  pantoprazole (PROTONIX) 40 MG tablet Take 1 tablet (40 mg total) by mouth 2 (two) times daily. Switch for any other PPI at similar dose and frequency Patient not taking: Reported on 09/29/2015 05/28/15   Thurnell Lose, MD  polyethylene glycol powder (GLYCOLAX/MIRALAX) powder Take 255 g by mouth daily as needed. Patient not taking: Reported on 09/29/2015 05/28/15   Thurnell Lose, MD  traMADol (ULTRAM) 50 MG tablet Take 0.5-1 tablets (25-50 mg total) by mouth every 6 (six) hours as needed for severe pain. Patient not taking: Reported on 09/29/2015 05/22/15   Verlee Monte, MD   BP 139/75 mmHg  Pulse 111  Temp(Src) 97.9 F (36.6 C) (Oral)  Resp 16  SpO2 100% Physical Exam  Constitutional: He is oriented to person, place, and time. He has a sickly appearance.  HENT:  Head: Normocephalic and atraumatic.  Eyes: Conjunctivae and EOM are normal.  Cardiovascular:  Normal rate and regular rhythm.   Pulmonary/Chest: Effort normal. No stridor. No respiratory distress.  Abdominal: He exhibits no distension. There is generalized tenderness. There is guarding.  Musculoskeletal: He exhibits no edema.  Neurological: He is alert and oriented to person, place, and time.  Skin: Skin is warm and dry.  Psychiatric: He has a normal mood and affect.  Nursing note and vitals reviewed.   ED Course  Procedures (including critical care time) Labs Review Labs Reviewed  CBC WITH DIFFERENTIAL/PLATELET - Abnormal; Notable for the following:    WBC 19.7 (*)    Hemoglobin 12.3 (*)    HCT 38.3 (*)    MCV 77.4 (*)    MCH 24.8 (*)    RDW 17.1 (*)    Neutro Abs 17.5 (*)    All other components within normal limits  COMPREHENSIVE METABOLIC PANEL - Abnormal; Notable for the following:    Chloride 98 (*)    Glucose, Bld 134 (*)    BUN 25 (*)    Creatinine, Ser 1.50 (*)    ALT 13 (*)    GFR calc non Af Amer 39 (*)    GFR calc Af Amer 45 (*)    All other components within normal limits  URINALYSIS, ROUTINE W REFLEX MICROSCOPIC (NOT AT [facility]) - Abnormal; Notable for the following:    Color, Urine AMBER (*)    APPearance CLOUDY (*)    Protein, ur 100 (*)    Leukocytes, UA SMALL (*)    All other components within normal limits  URINE MICROSCOPIC-ADD ON - Abnormal; Notable for the following:    Squamous Epithelial / LPF 0-5 (*)    Bacteria, UA RARE (*)    Casts HYALINE CASTS (*)    All other components within normal limits  I-STAT CG4 LACTIC ACID, ED - Abnormal; Notable for the following:    Lactic Acid, Venous 2.74 (*)    All other components within normal limits  CULTURE, BLOOD (ROUTINE X 2)  CULTURE, BLOOD (ROUTINE X 2)  LIPASE, BLOOD  LACTIC ACID, PLASMA  PROCALCITONIN  LACTIC ACID, PLASMA    Imaging Review Ct Abdomen Pelvis Wo Contrast  09/24/2015  CLINICAL DATA:  Acute generalized abdominal pain.  Vomiting. EXAM: CT ABDOMEN AND PELVIS WITHOUT CONTRAST  TECHNIQUE: Multidetector CT imaging of the abdomen and pelvis was performed following the standard protocol without IV contrast. COMPARISON:  CT scan of September 29, 2015. FINDINGS: Degenerative disc disease is noted at L4-5. Mild bilateral pleural effusions are noted with adjacent subsegmental atelectasis. No gallstones are noted. No focal abnormality is noted in the liver or spleen on these unenhanced images. Adrenal glands are unremarkable. Stable large left renal cyst is noted. No hydronephrosis or renal obstruction is noted. Atherosclerosis of abdominal aorta is noted without aneurysm formation. The appendix appears normal.  There is no evidence of small or large bowel obstruction. No abnormal fluid collection is noted. Stable moderate prostatic enlargement is noted. Urinary bladder appears normal. There appears to be mild inflammatory changes around the pancreatic head and proximal duodenum. Is uncertain if this represents peptic ulcer disease or possibly pancreatitis. There is now noted moderate gastric distention. Mildly enlarged lymph nodes are now seen inferior to the pancreatic head, most likely inflammatory in origin. IMPRESSION: Atherosclerosis of abdominal aorta without aneurysm formation. Mild bilateral pleural effusions with adjacent subsegmental atelectasis. Stable moderate prostatic enlargement. Interval development of moderate gastric distention as well as mild inflammatory changes in the region of the pancreatic head and proximal duodenum. It is uncertain if this represents pancreatitis or peptic ulcer disease. Mildly enlarged lymph nodes are now noted inferior pancreatic head and posterior to distal stomach, which most likely are inflammatory in etiology. Electronically Signed   By: Marijo Conception, M.D.   On: 09/24/2015 13:10   I have personally reviewed and evaluated these images and lab results as part of my medical decision-making.  Chart review notable for admission 1 week ago, for  colitis.   3:55 PM Now, following multiple doses of antibiotics, analgesia, antiemetics, 2 L of fluid, patient continues to complain of pain. He remains tachycardic. Patient had met sepsis criteria, was started on appropriate medication, fluid resuscitation.  Sepsis - Repeat Assessment  Performed at:    [facility]     Blood pressure 139/75, pulse 111, temperature 97.9 F (36.6 C), temperature source Oral, resp. rate 16, SpO2 100 %.  Heart:     Tachycardic  Lungs:    Wheezing  Capillary Refill:   <2 sec  Peripheral Pulse:   Radial pulse palpable  Skin:     Normal Color   MDM  Elderly male presents for days after initial diagnosis with colitis, now with increasing abdominal pain, nausea. His patient be tachycardic, with diffuse abdominal discomfort. No evidence for perforation on repeat study, though there is evidence for gastric distention. Given the patient's vital signs, lab abnormalities, the patient was treated for sepsis or Patient received appropriate fluid resuscitation, broad-spectrum antibiotics, multiple repeat evaluation, and required admission to the hospital for further evaluation and management.  CRITICAL CARE Performed by: Carmin Muskrat Total critical care time: 35 minutes Critical care time was exclusive of separately billable procedures and treating other patients. Critical care was necessary to treat or prevent imminent or life-threatening deterioration. Critical care was time spent personally by me on the following activities: development of treatment plan with patient and/or surrogate as well as nursing, discussions with consultants, evaluation of patient's response to treatment, examination of patient, obtaining history from patient or surrogate, ordering and performing treatments and interventions, ordering and review of laboratory studies, ordering and review of radiographic studies, pulse oximetry and re-evaluation of patient's condition.   Carmin Muskrat, MD 09/26/2015 [phone]

## 2015-10-07 NOTE — Patient Outreach (Signed)
Initial attempt made to contact member on Monday after being discharged over the weekend.  This care manager was prepared to make second attempt today, however noted that the member was in the emergency department and is likely to be readmitted.  Hospital liaison notified.  Will continue to follow and make attempts to initiate transition of care once discharged.  Kemper Durie, BSN, [facility] Care Management  [facility] Care Manager [phone]

## 2015-10-07 NOTE — ED Notes (Signed)
Per EMS pt just d/c from [facility] on 14th of Jan.  Started having pain the day after, last BM was yesterday and normal.  Vomiting x 2 this morning. Denies fever.

## 2015-10-07 NOTE — Progress Notes (Signed)
Pharmacy Antibiotic Follow-up Note  Luis Norton is a 82 y.o. year-old male admitted on 09/26/2015.  The patient was initially started on vancomycin, Levaquin, and aztreonam for rule out sepsis.  He presents with worsening abdominal pain after being discharged from hospital last week for colitis on cipro and flagyl.  Now, antibiotics are changed to ceftazidime and metronidazole for recurrent colitis.  Noted PCN allergy (swelling), but pt has previously tolerated Ceftazidime (05/2015) without documented reaction.  Assessment/Plan:  Ceftazidime 2g IV q24h  Metronidazole  IV q8h  Follow up renal fxn, culture results, and clinical course.    Temp (24hrs), Avg:98.2 F (36.8 C), Min:97.9 F (36.6 C), Max:98.4 F (36.9 C)   Recent Labs Lab 10/01/15 0540 10/03/15 0610 10/08/2015 1132  WBC 13.3* 8.8 19.7*    Recent Labs Lab 10/01/15 0540 10/02/15 0558 10/03/15 0610 10/06/2015 1132  CREATININE 1.10 1.51* 1.12 1.50*   Estimated Creatinine Clearance: 29.5 mL/min (by C-G formula based on Cr of 1.5).    Allergies  Allergen Reactions  . Penicillins Swelling    Has patient had a PCN reaction causing immediate rash, facial/tongue/throat swelling, SOB or lightheadedness with hypotension: YES Has patient had a PCN reaction causing severe rash involving mucus membranes or skin necrosis: NO Has patient had a PCN reaction that required hospitalization NO Has patient had a PCN reaction occurring within the last 10 years: NO If all of the above answers are "NO", then may proceed with Cephalosporin use.    Antimicrobials this admission: 1/18 >> vanc >> 1/18 1/18 >> aztreonam >>  1/18 1/18 >> levaquin >> 1/18 1/18 >> Ceftazidime >> 1/18 >> metronidazole >>  Levels/dose changes this admission:  None  Microbiology results: 1/18 BCx: sent  Thank you for allowing pharmacy to be a part of this patient's care.  Lynann [facility] PharmD, BCPS Pager [phone] 10/13/2015 8:40 PM

## 2015-10-07 NOTE — ED Notes (Signed)
MD at bedside. 

## 2015-10-07 NOTE — H&P (Addendum)
[facility] History and Physical  Luis Norton WEE:[id number] DOB: July 05, 1922 DOA: 09/23/2015  Referring physician: ER physician: Dr. Gerhard Munch PCP: Katy Apo, MD  Chief Complaint: abdominal pain  HPI:  82 year old male with history of a fib (not on AC due to risk of GI bleed), hypertension, recent hospitalization for colitis (Admission from 1/10 through 1/14). He presented back to [facility] ED with worsening abdominal pain which apparently is ever since discharge. He also had persistent nausea and non bloody vomiting. His pain is mostly in mid to lower abdomen, intermittent, sharp, non radiating and about 7-8/10 in intensity. No fevers at home but he was on cipro and flagyl since the discharge. No reports of diarrhea or constipation. No chest pain, no palpitations, no shortness of breath. No urinary complaints.  In ED, BP was 129/75, HR 116, RR 24, T max 98.4 F and oxygen saturation was 91-100%. Blood work showed leukocytosis of 19.7, creatinine 1.5, lactic acid was 2.74, mild elevation in procalcitonin level at 0.12. CT abdomen showed interval development of moderate gastric distention as well as mild inflammatory changes in the region of the pancreatic head and proximal duodenum,  uncertain if this represents pancreatitis or peptic ulcer disease. He was palced on empiric antibioitcs, ceftaz and flagyl. Blood cultures were obtained, recent C.diff 09/30/2015 was negative.   Assessment & Plan    Principal Problem: Abdominal pain / Sepsis due to possible colitis / Gastritis or PUD / UTI / Leukocytosis  - CT abdomen with findings of gastric distention as well as mild inflammatory changes in the region of the pancreatic head and proximal duodenum. He also has small leukocytes on UA so he could have UTI - He was just recently hospitalized for colitis and now has essentially progression of this pain - Sepsis criteria met on admission with tachycardia, tachypnea, leukocytosis and lactic  acidosis. - We will place on empiric abx, ceftazidime and flagyl - F/U blood culture results - Hemodynamically stable and can be monitored on telemetry unit - Currently NPO until pain better  Active Problems: Acute renal failure superimposed on CKD stage 3 - Recent Cr 1.5 and on this admission at baseline value  History of paroxysmal atrial fibrillation - Chads 2 vasc score is 3. - Not on AC due to risk of GI bleed  Essential hypertension - Placed on hydralazine PRN   DVT prophylaxis:  - SCD's bilaterally   Radiological Exams on Admission: Ct Abdomen Pelvis Wo Contrast 09/30/2015   Atherosclerosis of abdominal aorta without aneurysm formation. Mild bilateral pleural effusions with adjacent subsegmental atelectasis. Stable moderate prostatic enlargement. Interval development of moderate gastric distention as well as mild inflammatory changes in the region of the pancreatic head and proximal duodenum. It is uncertain if this represents pancreatitis or peptic ulcer disease. Mildly enlarged lymph nodes are now noted inferior pancreatic head and posterior to distal stomach, which most likely are inflammatory in etiology. Electronically Signed   By: Lupita Raider, M.D.   On: 10/10/2015 13:10    Code Status: DNR/DNI Family Communication: Plan of care discussed with the patient  Disposition Plan: Admit for further evaluation, telemetry admission  Manson Passey, MD  [facility] Pager [phone]  Time spent in minutes: 75 minutes  Review of Systems:  Constitutional: Negative for fever, chills and malaise/fatigue. Negative for diaphoresis.  HENT: Negative for hearing loss, ear pain, nosebleeds, congestion, sore throat, neck pain, tinnitus and ear discharge.   Eyes: Negative for blurred vision, double vision, photophobia, pain, discharge  and redness.  Respiratory: Negative for cough, hemoptysis, sputum production, shortness of breath, wheezing and stridor.   Cardiovascular: Negative  for chest pain, palpitations, orthopnea, claudication and leg swelling.  Gastrointestinal: per HPI Genitourinary: Negative for dysuria, urgency, frequency, hematuria and flank pain.  Musculoskeletal: Negative for myalgias, back pain, joint pain and falls.  Skin: Negative for itching and rash.  Neurological: Negative for dizziness and weakness. Negative for tingling, tremors, sensory change, speech change, focal weakness, loss of consciousness and headaches.  Endo/Heme/Allergies: Negative for environmental allergies and polydipsia. Does not bruise/bleed easily.  Psychiatric/Behavioral: Negative for suicidal ideas. The patient is not nervous/anxious.      Past Medical History  Diagnosis Date  . Hypertension   . Hypercholesterolemia   . ED (erectile dysfunction)   . Urinary frequency     bladder instablity and outlet obstruction   . Constipation   . PUD (peptic ulcer disease)     history of   . BPH (benign prostatic hyperplasia)   . Atrial fibrillation [facility])    Past Surgical History  Procedure Laterality Date  . Ankle surgery Right   . Inguinal hernia repair Bilateral 05/20/2015    Procedure: HERNIA REPAIR INGUINAL ADULT BILATERAL;  Surgeon: Coralie Keens, MD;  Location: [place];  Service: General;  Laterality: Bilateral;  . Insertion of mesh Bilateral 05/20/2015    Procedure: INSERTION OF MESH;  Surgeon: Coralie Keens, MD;  Location: [place];  Service: General;  Laterality: Bilateral;  . Esophagogastroduodenoscopy N/A 05/26/2015    Procedure: ESOPHAGOGASTRODUODENOSCOPY (EGD);  Surgeon: Clarene Essex, MD;  Location: [facility] ENDOSCOPY;  Service: Endoscopy;  Laterality: N/A;   Social History:  reports that he has quit smoking. He does not have any smokeless tobacco history on file. He reports that he does not drink alcohol or use illicit drugs.  Allergies  Allergen Reactions  . Penicillins Swelling    Has patient had a PCN reaction causing immediate rash, facial/tongue/throat swelling, SOB or  lightheadedness with hypotension: YES Has patient had a PCN reaction causing severe rash involving mucus membranes or skin necrosis: NO Has patient had a PCN reaction that required hospitalization NO Has patient had a PCN reaction occurring within the last 10 years: NO If all of the above answers are "NO", then may proceed with Cephalosporin use.    Family History:  Family History  Problem Relation Age of Onset  . Hypertension Other      Prior to Admission medications   Medication Sig Start Date End Date Taking? Authorizing Provider  bisacodyl (DULCOLAX) 5 MG EC tablet 1 tab q hs Prn constipation Patient not taking: Reported on 09/29/2015 09/23/13   Janne Napoleon, NP  ciprofloxacin (CIPRO) 500 MG tablet Take 1 tablet (500 mg total) by mouth 2 (two) times daily. 10/02/15   Reyne Dumas, MD  metoprolol succinate (TOPROL-XL) 25 MG 24 hr tablet Take 1 tablet (25 mg total) by mouth daily. Patient not taking: Reported on 05/24/2015 05/22/15   Verlee Monte, MD  metroNIDAZOLE (FLAGYL) 500 MG tablet Take 1 tablet (500 mg total) by mouth 3 (three) times daily. 10/02/15   Reyne Dumas, MD  pantoprazole (PROTONIX) 40 MG tablet Take 1 tablet (40 mg total) by mouth 2 (two) times daily. Switch for any other PPI at similar dose and frequency Patient not taking: Reported on 09/29/2015 05/28/15   Thurnell Lose, MD  polyethylene glycol powder (GLYCOLAX/MIRALAX) powder Take 255 g by mouth daily as needed. Patient not taking: Reported on 09/29/2015 05/28/15   Deno Etienne K  Candiss Norse, MD  traMADol (ULTRAM) 50 MG tablet Take 0.5-1 tablets (25-50 mg total) by mouth every 6 (six) hours as needed for severe pain. Patient not taking: Reported on 09/29/2015 05/22/15   Verlee Monte, MD   Physical Exam: Filed Vitals:   10/08/2015 1025 09/29/2015 1030 10/01/2015 1151 10/03/2015 1306  BP: 148/107 129/75  138/80  Pulse: 112 106 51 109  Temp: 97.9 F (36.6 C)     TempSrc: Oral     Resp: '18  18 15  '$ SpO2: 99% 95% 99% 93%    Physical Exam   Constitutional: Appears well-developed and well-nourished. No distress.  HENT: Normocephalic. No tonsillar erythema or exudates Eyes: Conjunctivae are normal. No scleral icterus.  Neck: Normal ROM. Neck supple. No JVD. No tracheal deviation. No thyromegaly.  CVS: RRR, S1/S2 appreciated  Pulmonary: Effort and breath sounds normal, no stridor, rhonchi, wheezes, rales.  Abdominal: Soft. BS +,  no distension, tenderness, rebound or guarding.  Musculoskeletal: Normal range of motion. No edema and no tenderness.  Lymphadenopathy: No lymphadenopathy noted, cervical, inguinal. Neuro: Alert. Normal reflexes, muscle tone coordination. No focal neurologic deficits. Skin: Skin is warm and dry. No rash noted.  No erythema. No pallor.  Psychiatric: Normal mood and affect. Behavior, judgment, thought content normal.   Labs on Admission:  Basic Metabolic Panel:  Recent Labs Lab 10/01/15 0540 10/02/15 0558 10/03/15 0610 10/03/2015 1132  NA 137 138 137 138  K 3.8 4.5 4.3 4.0  CL 99* 102 106 98*  CO2 '29 29 26 29  '$ GLUCOSE 120* 108* 99 134*  BUN '10 12 14 '$ 25*  CREATININE 1.10 1.51* 1.12 1.50*  CALCIUM 9.1 8.7* 8.3* 9.2   Liver Function Tests:  Recent Labs Lab 10/02/15 0558 10/03/15 0610 09/20/2015 1132  AST '19 20 24  '$ ALT 9* 11* 13*  ALKPHOS 114 98 99  BILITOT 0.5 0.4 0.4  PROT 6.5 5.8* 7.1  ALBUMIN 3.1* 2.7* 3.5    Recent Labs Lab 10/18/2015 1132  LIPASE 24   No results for input(s): AMMONIA in the last 168 hours. CBC:  Recent Labs Lab 10/01/15 0540 10/03/15 0610 10/20/2015 1132  WBC 13.3* 8.8 19.7*  NEUTROABS  --   --  17.5*  HGB 12.5* 10.2* 12.3*  HCT 40.6 33.3* 38.3*  MCV 79.0 78.9 77.4*  PLT 338 270 345   Cardiac Enzymes: No results for input(s): CKTOTAL, CKMB, CKMBINDEX, TROPONINI in the last 168 hours. BNP: Invalid input(s): POCBNP CBG:  Recent Labs Lab 10/02/15 0558 10/02/15 1206 10/02/15 1713 10/02/15 2200 10/02/15 2306  GLUCAP 119* 120* 116* 106* 119*     If 7PM-7AM, please contact night-coverage [email or web address] Password [email or web address] 09/30/2015, 2:05 PM

## 2015-10-07 NOTE — ED Notes (Signed)
Pt returned from CT °

## 2015-10-08 DIAGNOSIS — Z66 Do not resuscitate: Secondary | ICD-10-CM | POA: Insufficient documentation

## 2015-10-08 DIAGNOSIS — N179 Acute kidney failure, unspecified: Secondary | ICD-10-CM

## 2015-10-08 DIAGNOSIS — Z9189 Other specified personal risk factors, not elsewhere classified: Secondary | ICD-10-CM

## 2015-10-08 DIAGNOSIS — K529 Noninfective gastroenteritis and colitis, unspecified: Secondary | ICD-10-CM

## 2015-10-08 DIAGNOSIS — N183 Chronic kidney disease, stage 3 (moderate): Secondary | ICD-10-CM

## 2015-10-08 DIAGNOSIS — D638 Anemia in other chronic diseases classified elsewhere: Secondary | ICD-10-CM | POA: Diagnosis present

## 2015-10-08 DIAGNOSIS — E876 Hypokalemia: Secondary | ICD-10-CM | POA: Diagnosis present

## 2015-10-08 DIAGNOSIS — Z515 Encounter for palliative care: Secondary | ICD-10-CM

## 2015-10-08 LAB — COMPREHENSIVE METABOLIC PANEL
ALBUMIN: 2.7 g/dL — AB (ref 3.5–5.0)
ALT: 10 U/L — ABNORMAL LOW (ref 17–63)
ANION GAP: 8 (ref 5–15)
AST: 14 U/L — ABNORMAL LOW (ref 15–41)
Alkaline Phosphatase: 76 U/L (ref 38–126)
BILIRUBIN TOTAL: 0.4 mg/dL (ref 0.3–1.2)
BUN: 24 mg/dL — ABNORMAL HIGH (ref 6–20)
CO2: 28 mmol/L (ref 22–32)
Calcium: 8.5 mg/dL — ABNORMAL LOW (ref 8.9–10.3)
Chloride: 101 mmol/L (ref 101–111)
Creatinine, Ser: 1.3 mg/dL — ABNORMAL HIGH (ref 0.61–1.24)
GFR calc non Af Amer: 46 mL/min — ABNORMAL LOW (ref >60)
GFR, EST AFRICAN AMERICAN: 54 mL/min — AB (ref >60)
GLUCOSE: 135 mg/dL — AB (ref 65–99)
POTASSIUM: 3.7 mmol/L (ref 3.5–5.1)
SODIUM: 137 mmol/L (ref 135–145)
TOTAL PROTEIN: 5.6 g/dL — AB (ref 6.5–8.1)

## 2015-10-08 LAB — CBC
HEMATOCRIT: 32.1 % — AB (ref 39.0–52.0)
HEMOGLOBIN: 10.1 g/dL — AB (ref 13.0–17.0)
MCH: 24.8 pg — ABNORMAL LOW (ref 26.0–34.0)
MCHC: 31.5 g/dL (ref 30.0–36.0)
MCV: 78.9 fL (ref 78.0–100.0)
Platelets: 296 10*3/uL (ref 150–400)
RBC: 4.07 MIL/uL — AB (ref 4.22–5.81)
RDW: 17.1 % — ABNORMAL HIGH (ref 11.5–15.5)
WBC: 22.4 10*3/uL — ABNORMAL HIGH (ref 4.0–10.5)

## 2015-10-08 LAB — AMYLASE: Amylase: 58 U/L (ref 28–100)

## 2015-10-08 LAB — GLUCOSE, CAPILLARY: Glucose-Capillary: 119 mg/dL — ABNORMAL HIGH (ref 65–99)

## 2015-10-08 MED ORDER — FUROSEMIDE 10 MG/ML IJ SOLN
20.0000 mg | Freq: Once | INTRAMUSCULAR | Status: AC
  Administered 2015-10-08: 20 mg via INTRAVENOUS
  Filled 2015-10-08: qty 2

## 2015-10-08 MED ORDER — DEXTROSE 5 % IV SOLN
2.0000 g | Freq: Two times a day (BID) | INTRAVENOUS | Status: DC
  Administered 2015-10-08 – 2015-10-10 (×4): 2 g via INTRAVENOUS
  Filled 2015-10-08 (×5): qty 2

## 2015-10-08 NOTE — Progress Notes (Signed)
Brief Pharmacy Antibiotic Follow Up note:  Luis Norton is a 82 y.o. year-old male admitted on 10/11/2015. The patient was initially started on vancomycin, Levaquin, and aztreonam for rule out sepsis. He presents with worsening abdominal pain after being discharged from hospital last week for colitis on cipro and flagyl. Now, antibiotics are changed to ceftazidime and metronidazole for recurrent colitis.  Noted PCN allergy (swelling), but pt has previously tolerated Ceftazidime (05/2015) without documented reaction.  Assessment/Plan:   Increase ceftazidime to 2gm IV q12h for CrCl >57mls/min  Continue metronidazole  IV q8h  Follow up renal fxn, culture results and clinical course  Arley Phenix RPh 10/08/2015, 11:24 AM Pager [phone]

## 2015-10-08 NOTE — Progress Notes (Signed)
Patient ID: Luis Norton, male   DOB: 08/17/1922, 82 y.o.   MRN: [id number] [facility] PROGRESS NOTE  Luis Norton YFV:[id number] DOB: [birth year] DOA: 10/13/2015 PCP: Kandice Hams, MD  Brief narrative:    82 year old male with history of a fib (not on AC due to risk of GI bleed), hypertension, recent hospitalization for colitis (admission from 1/10 through 1/14). He presented back to [facility] ED with worsening abdominal pain since this recent hospitalization. Pt reported pain to be diffuse, intermittent, 7-8/10 in intensity somewhat better with analgesia given in ED. No fevers. He was still on cipro and flagyl from last admission for colitis. No diarrhea. No blood in stool.   In ED, BP was 129/75, HR 116, RR 24, T max 98.4 F and oxygen saturation was 91-100%. Blood work showed leukocytosis of 19.7, creatinine 1.5, lactic acid was 2.74, mild elevation in procalcitonin level at 0.12. CT abdomen showed interval development of moderate gastric distention as well as mild inflammatory changes in the region of the pancreatic head and proximal duodenum, uncertain if this represents pancreatitis or peptic ulcer disease. He was palced on empiric antibioitcs, ceftaz and flagyl. Blood cultures were obtained, recent C.diff 09/30/2015 was negative.  Barrier to discharge: more rhonchorous this am, will give low dose lasix 20 mg IV. Placed consult for PCT.  Assessment/Plan:    Principal Problem: Abdominal pain / Sepsis due to possible colitis / Gastritis or PUD / UTI / Leukocytosis  - Sepsis criteria met on the admission with tachycardia, tachypnea, leukocytosis and lactic acidosis but source of infection is still unclear - CT abdomen with findings of gastric distention as well as mild inflammatory changes in the region of the pancreatic head and proximal duodenum. UA on admission showed small leukocytes on UA so he could have UTI. - Considering he was recently hospitalized for colitis this also is a  possibility as a source of infection - Continue ceftazidime and flagyl - Blood cultures pending  Active Problems: Aspiration risk - Rhonchorous on physical exam - Placed order for aspiration risk - SLP evaluation  Acute renal failure superimposed on CKD stage 3 - Recent Cr 1.5 and on this admission at baseline value - Creatinine is 1.30 this morning  Hypokalemia - Likely due to dehydration, GI losses - Supplemented and now within normal limits  Anemia of chronic disease - Secondary to anemia of chronic kidney disease - Hemoglobin stable at 10.1  History of paroxysmal atrial fibrillation - Chads 2 vasc score is 3. - Not on AC due to risk of GI bleed - Rate is currently controlled  Essential hypertension - Blood pressure is 105/64. He is only on as needed hydralazine for blood pressure above 150/90.  DVT prophylaxis:  - SCD's bilaterally   Code Status: DNR/DNI Family Communication: Family not at the bedside  Disposition Plan: Unclear at this point where patient will be stable for discharge, possibly next couple of days, home versus skilled nursing facility. PT evaluation pending.  IV access:  Peripheral IV  Procedures and diagnostic studies:    Ct Abdomen Pelvis Wo Contrast 10/04/2015 Atherosclerosis of abdominal aorta without aneurysm formation. Mild bilateral pleural effusions with adjacent subsegmental atelectasis. Stable moderate prostatic enlargement. Interval development of moderate gastric distention as well as mild inflammatory changes in the region of the pancreatic head and proximal duodenum. It is uncertain if this represents pancreatitis or peptic ulcer disease. Mildly enlarged lymph nodes are now noted inferior pancreatic head and posterior to distal stomach, which most likely  are inflammatory in etiology. Electronically Signed By: Marijo Conception, M.D. On: 10/14/2015 13:10   Medical Consultants:  PCT  Other Consultants:   PT SLP Nutrition  IAnti-Infectives:   Ceftazidime 10/02/2015 --> Flagyl 10/01/2015 -->   Leisa Lenz, MD  [facility] Pager [phone]  Time spent in minutes: 25 minutes  If 7PM-7AM, please contact night-coverage [email or web address] Password [facility] 10/08/2015, 9:51 AM   LOS: 1 day    HPI/Subjective: No acute overnight events. Patient sleeping.   Objective: Filed Vitals:   10/18/2015 1930 10/18/2015 2012 10/08/15 0000 10/08/15 0630  BP: 144/95 161/87  105/64  Pulse: 116   116  Temp:  98.4 F (36.9 C)  98.7 F (37.1 C)  TempSrc:    Oral  Resp:  24  22  Height:   _0  (1.727 m)   Weight:   151 lb (68.493 kg)   SpO2: 100% 100%  100%    Intake/Output Summary (Last 24 hours) at 10/08/15 0951 Last data filed at 10/08/15 7473  Gross per 24 hour  Intake      0 ml  Output    300 ml  Net   -300 ml    Exam:   General:  Pt is not in acute distress  Cardiovascular: tachycardic, S1/S2 (+)  Respiratory: rhonchorous, wheezing in upper lung lobes   Abdomen: Soft, non tender, non distended, bowel sounds present  Extremities: No edema, pulses DP and PT palpable bilaterally  Neuro: Grossly nonfocal  Data Reviewed: Basic Metabolic Panel:  Recent Labs Lab 10/02/15 0558 10/03/15 0610 10/14/2015 1132 10/13/2015 2038 10/08/15 0535  NA 138 137 138 135 137  K 4.5 4.3 4.0 3.3* 3.7  CL 102 106 98* 96* 101  CO2 _1 GLUCOSE 108* 99 134* 145* 135*  BUN 12 14 25* 22* 24*  CREATININE 1.51* 1.12 1.50* 1.27* 1.30*  CALCIUM 8.7* 8.3* 9.2 8.2* 8.5*  MG  --   --   --  2.0  --   PHOS  --   --   --  2.9  --    Liver Function Tests:  Recent Labs Lab 10/02/15 0558 10/03/15 0610 10/02/2015 1132 09/21/2015 2038 10/08/15 0535  AST _2 14*  ALT 9* 11* 13* 10* 10*  ALKPHOS 114 98 99 84 76  BILITOT 0.5 0.4 0.4 0.6 0.4  PROT 6.5 5.8* 7.1 6.0* 5.6*  ALBUMIN 3.1* 2.7* 3.5 3.0* 2.7*    Recent Labs Lab 09/25/2015 1132  LIPASE 24   No results for input(s):  AMMONIA in the last 168 hours. CBC:  Recent Labs Lab 10/03/15 0610 09/28/2015 1132 10/05/2015 2038 10/08/15 0535  WBC 8.8 19.7* 18.9* 22.4*  NEUTROABS  --  17.5* 16.2*  --   HGB 10.2* 12.3* 11.4* 10.1*  HCT 33.3* 38.3* 35.3* 32.1*  MCV 78.9 77.4* 77.4* 78.9  PLT 270 345 332 296   Cardiac Enzymes: No results for input(s): CKTOTAL, CKMB, CKMBINDEX, TROPONINI in the last 168 hours. BNP: Invalid input(s): POCBNP CBG:  Recent Labs Lab 10/02/15 1206 10/02/15 1713 10/02/15 2200 10/02/15 2306 10/08/15 0733  GLUCAP 120* 116* 106* 119* 119*    Recent Results (from the past 240 hour(s))  C difficile quick scan w PCR reflex     Status: None   Collection Time: 09/30/15  8:49 AM  Result Value Ref Range Status   C Diff antigen NEGATIVE NEGATIVE Final   C Diff toxin NEGATIVE NEGATIVE Final   C Diff interpretation Negative  for toxigenic C. difficile  Final     Scheduled Meds: . cefTAZidime (FORTAZ)  IV  2 g Intravenous Q24H  . metronidazole  500 mg Intravenous 3 times per day  . pantoprazole (PROTONIX) IV  40 mg Intravenous Q12H  . sodium chloride  3 mL Intravenous Q12H   Continuous Infusions: . sodium chloride 50 mL/hr at 09/26/2015 2008

## 2015-10-08 NOTE — Consult Note (Signed)
Consultation Note Date: 10/08/2015   Patient Name: Luis Norton  DOB: 1922/08/04  MRN: [id number]  Age / Sex: 82 y.o., male  PCP: Luis Dills, MD Referring Physician: Alison Murray, MD  Reason for Consultation: Disposition, Establishing goals of care and Pain control    Clinical Assessment/Narrative: Luis Norton is a 82 year old male with atrial fibrillation (not on anticoagulation), cognitive impairment, and recent history of bilateral incarcerated inguinal hernia repair in August 2016, GI bleed with H. pylori positive PUD in September 2016, colitis with acute renal failure in early January 2017 and a new admission for sepsis and abdominal pain on 2015-10-22. He has had 4 admissions in the last 6 months.  This morning Luis Norton is resting in his bed. He has copious clear secretions and is suctioning them from his mouth. His speech is a mix of mumbling and clear-he states "I feel like I've been hit by a truck".  He denies abdominal pain.  He indicates that he lives with his son, [name].  The number listed for Luis Norton in Epic is incorrect. I spoke with the patient's niece Luis Norton who re-confirms the results of the palliative medicine consultation held in September 2016 with Luis Creed, NP, which include:   the patient is a DNR/DNI, he would never want a PEG tube placed. He has no specific health care power of attorney.  According to [name], the patient's son, Luis Norton, who he lives with is unstable. I advised [name] that palliative medicine will follow Luis Norton and that a second family meeting may be necessary in the coming days. She was open for a follow-up family meeting.   [name] also mentions that he has lost weight.  Epic indicates he has lost approximately 25 lbs in the last 6 months.  In reading Luis Norton recommendations from September 2016 the patient was supposed to  be treated for H. pylori. I have called the [facility] office and it does not appear he had a follow-up visit to receive the 2 week antibiotic/PPI therapy for H. Pylori.  Luis Norton office also does not have record of the patient receiving antibiotics for H Pylori.  Contacts/Participants in Discussion: Phone call with Luis Norton Primary Decision Maker: Family Relationship to Patient  Family HCPOA: None    SUMMARY OF RECOMMENDATIONS  DNR / DNI, no artifical feeding. Patient needs a specific HCPOA if possible.   Consider H Pylori treatment and if appropriate, GI consultation for abdominal pain. Will follow along to assist with symptom management and disposition.   Code Status/Advance Care Planning: DNR    Code Status Orders        Start     Ordered   Oct 22, 2015 2046  Do not attempt resuscitation (DNR)   Continuous    Question Answer Comment  In the event of cardiac or respiratory ARREST Do not call a "code blue"   In the event of cardiac or respiratory ARREST Do not perform Intubation, CPR, defibrillation or ACLS   In the event of cardiac or respiratory ARREST Use medication by any route, position, wound care, and other measures to relive pain and suffering. May use oxygen, suction and manual treatment of airway obstruction as needed for comfort.      Oct 22, 2015 2045    Code Status History    Date Active Date Inactive Code Status Order ID Comments User Context   22-Oct-2015  8:00 PM 10/22/2015  8:45 PM Full Code [id number]  Luis Murray, MD  Inpatient   09/30/2015  1:42 AM 10/03/2015  5:28 PM DNR [id number]  Luis Clos, MD Inpatient   05/25/2015  1:31 PM 05/28/2015 10:26 PM Partial Code [id number]  Luis Brim, NP Inpatient   05/24/2015  5:25 PM 05/25/2015  1:31 PM Full Code [id number]  Luis Bennett, MD ED   05/16/2015 11:55 PM 05/22/2015  5:19 PM Full Code [id number]  Luis Mo, MD Inpatient      Other Directives:None  Symptom Management:   Currently on IV dilaudid and  BID PPI appears comfortable.  Patient has copious secretions - will monitor for now.  Palliative Prophylaxis:   Aspiration, Bowel Regimen and Frequent Pain Assessment  Additional Recommendations (Limitations, Scope, Preferences):  Full Scope Treatment and No Artificial Feeding - DNR/DNI  25 lb weight loss in 6 months.  Will order nutrition consult.   Psycho-social/Spiritual:  Support System: Poor Desire for further Chaplaincy support: No Additional Recommendations: Caregiving  Support/Resources  Prognosis: Unable to determine  Discharge Planning: To be determined.    Patient would benefit from a supervised environment.  Chief Complaint/ Primary Diagnoses: Present on Admission:  . Generalized abdominal pain . Essential hypertension . Colitis . Paroxysmal atrial fibrillation (HCC) . UTI (urinary tract infection) . Leukocytosis . Acute renal failure superimposed on stage 3 chronic kidney disease (HCC) . Anemia of chronic disease . Hypokalemia  I have reviewed the medical record, interviewed the patient and family, and examined the patient. The following aspects are pertinent.  History reviewed. No pertinent past medical history. Social History   Social History  . Marital Status: Married    Spouse Name: N/A  . Number of Children: N/A  . Years of Education: N/A   Social History Main Topics  . Smoking status: Former Games developer  . Smokeless tobacco: None  . Alcohol Use: No  . Drug Use: No  . Sexual Activity: Not Asked   Other Topics Concern  . None   Social History Narrative   Family History  Problem Relation Age of Onset  . Hypertension Other    Scheduled Meds: . cefTAZidime (FORTAZ)  IV  2 g Intravenous Q12H  . metronidazole  500 mg Intravenous 3 times per day  . pantoprazole (PROTONIX) IV  40 mg Intravenous Q12H  . sodium chloride  3 mL Intravenous Q12H   Continuous Infusions: . sodium chloride 30 mL/hr at 10/08/15 1021   PRN Meds:.acetaminophen **OR**  acetaminophen, hydrALAZINE, HYDROmorphone (DILAUDID) injection, ondansetron **OR** ondansetron (ZOFRAN) IV Medications Prior to Admission:  Prior to Admission medications   Medication Sig Start Date End Date Taking? Authorizing Provider  ciprofloxacin (CIPRO) 500 MG tablet Take 1 tablet (500 mg total) by mouth 2 (two) times daily. 10/02/15  Yes Richarda Overlie, MD  metroNIDAZOLE (FLAGYL) 500 MG tablet Take 1 tablet (500 mg total) by mouth 3 (three) times daily. 10/02/15  Yes Richarda Overlie, MD   Allergies  Allergen Reactions  . Penicillins Swelling    Has patient had a PCN reaction causing immediate rash, facial/tongue/throat swelling, SOB or lightheadedness with hypotension: YES Has patient had a PCN reaction causing severe rash involving mucus membranes or skin necrosis: NO Has patient had a PCN reaction that required hospitalization NO Has patient had a PCN reaction occurring within the last 10 years: NO If all of the above answers are "NO", then may proceed with Cephalosporin use.    Review of Systems  Unable to understand patient.  Physical Exam  Constitutional: He appears well-developed.  HENT:  Head: Normocephalic.  Eyes: Pupils are equal, round, and reactive to light.  Neck: Normal range of motion.  Cardiovascular:  Irregularly irregular, rate controlled.  Respiratory:  No increased work of breathing, but sounded wet, gurgly.  GI:  Firm, mildly tender to palpation.  Decreased bowel sounds.  Musculoskeletal:  Able to move all 4.  No edema  Neurological: He is alert.  No acute focal deficits, mumbling speech  Skin: Skin is warm and dry.    Vital Signs: BP 105/64 mmHg  Pulse 116  Temp(Src) 98.7 F (37.1 C) (Oral)  Resp 22  Ht  (1.727 m)  Wt 68.493 kg (151 lb)  BMI 22.96 kg/m2  SpO2 100%  SpO2: SpO2: 100 % O2 Device:SpO2: 100 % O2 Flow Rate: .O2 Flow Rate (L/min): 2 L/min  IO: Intake/output summary:  Intake/Output Summary (Last 24 hours) at 10/08/15 1147 Last  data filed at 10/08/15 1610  Gross per 24 hour  Intake      0 ml  Output    300 ml  Net   -300 ml    LBM: Last BM Date: 09/20/2015 Baseline Weight: Weight: 68.493 kg (151 lb) Most recent weight: Weight: 68.493 kg (151 lb)      Palliative Assessment/Data:  Flowsheet Rows        Most Recent Value   Intake Tab    Referral Department  Hospitalist   Unit at Time of Referral  Med/Surg Unit   Palliative Care Primary Diagnosis  Sepsis/Infectious Disease   Date Notified  10/08/15   Palliative Care Type  Return patient Palliative Care   Reason for referral  Pain, Non-pain Symptom, Clarify Goals of Care   Date of Admission  09/24/2015   Date first seen by Palliative Care  10/08/15   # of days Palliative referral response time  0 Day(s)   # of days IP prior to Palliative referral  1   Clinical Assessment    Palliative Performance Scale Score  30%   Pain Max last 24 hours  8   Pain Min Last 24 hours  4   Psychosocial & Spiritual Assessment    Palliative Care Outcomes       Additional Data Reviewed:  CBC:    Component Value Date/Time   WBC 22.4* 10/08/2015 0535   HGB 10.1* 10/08/2015 0535   HCT 32.1* 10/08/2015 0535   PLT 296 10/08/2015 0535   MCV 78.9 10/08/2015 0535   NEUTROABS 16.2* 10/06/2015 2038   LYMPHSABS 0.8 09/24/2015 2038   MONOABS 1.8* 09/20/2015 2038   EOSABS 0.0 10/06/2015 2038   BASOSABS 0.0 09/20/2015 2038   Comprehensive Metabolic Panel:    Component Value Date/Time   NA 137 10/08/2015 0535   K 3.7 10/08/2015 0535   CL 101 10/08/2015 0535   CO2 28 10/08/2015 0535   BUN 24* 10/08/2015 0535   CREATININE 1.30* 10/08/2015 0535   GLUCOSE 135* 10/08/2015 0535   CALCIUM 8.5* 10/08/2015 0535   AST 14* 10/08/2015 0535   ALT 10* 10/08/2015 0535   ALKPHOS 76 10/08/2015 0535   BILITOT 0.4 10/08/2015 0535   PROT 5.6* 10/08/2015 0535   ALBUMIN 2.7* 10/08/2015 0535     Time In: 11:00 Time Out: 12:10 Time Total: 70 min Greater than 50%  of this time was spent  counseling and coordinating care related to the above assessment and plan.  Signed by:  Stephani Police, PA-C  10/08/2015, 11:47 AM  Please contact Palliative Medicine Team phone at [phone] for questions and concerns.

## 2015-10-08 NOTE — Progress Notes (Signed)
Initial Nutrition Assessment  DOCUMENTATION CODES:   Not applicable  INTERVENTION:  - RD will continue to monitor for POC/GOC and associated needs  NUTRITION DIAGNOSIS:   Inadequate oral intake related to inability to eat as evidenced by NPO status.  GOAL:   Patient will meet greater than or equal to 90% of their needs  MONITOR:   Diet advancement, Weight trends, Labs, I & O's  REASON FOR ASSESSMENT:   Consult Assessment of nutrition requirement/status  ASSESSMENT:   82 year old male with history of a fib (not on AC due to risk of GI bleed), hypertension, recent hospitalization for colitis (Admission from 1/10 through 1/14). He presented back to [facility] ED with worsening abdominal pain which apparently is ever since discharge. He also had persistent nausea and non bloody vomiting. His pain is mostly in mid to lower abdomen, intermittent, sharp, non radiating and about 7-8/10 in intensity. No fevers at home but he was on cipro and flagyl since the discharge. No reports of diarrhea or constipation. No chest pain, no palpitations, no shortness of breath. No urinary complaints.  Pt seen for consult. BMI indicates normal weight. Pt has been NPO since admission and unable to meet needs. SLP saw pt today and recommends NPO status.   Pt/family seen by Palliative Care today and associated note from 1145 states: Spoke with the patient's niece [name] who re-confirms the results of the palliative medicine consultation held in September 2016 with [name], NP, which include: the patient is a DNR/DNI, he would never want a PEG tube placed. He has no specific health care power of attorney.  No family/visitors present at time of RD visit. Pt sleeping and would briefly arouse and make a mumbling noise but otherwise non-interactive. Unable to obtain information at this time. Will continue to monitor POC and intervene as necessary at that time.   Unable to do physical assessment at this  time with respect to pt's comfort. Per chart review, pt has gained 8 lbs in the past 2 months.  Medications reviewed. Labs reviewed; CBGs: 101-120 mg/dL, BUN/creatinine elevated and trending up, Ca: 8.5 mg/dL.   Diet Order:  Diet NPO time specified  Skin:  Reviewed, no issues  Last BM:  1/18  Height:   Ht Readings from Last 1 Encounters:  10/08/15  (1.727 m)    Weight:   Wt Readings from Last 1 Encounters:  10/08/15 151 lb (68.493 kg)    Ideal Body Weight:  70 kg (kg)  BMI:  Body mass index is 22.96 kg/(m^2).  Estimated Nutritional Needs:   Kcal:  1370-1550  Protein:  55-65 grams  Fluid:  1.8-2 L/day  EDUCATION NEEDS:   No education needs identified at this time     Trenton Gammon, RD, LDN Inpatient Clinical Dietitian Pager # [phone] After hours/weekend pager # [phone]

## 2015-10-08 NOTE — Evaluation (Addendum)
Clinical/Bedside Swallow Evaluation Patient Details  Name: Luis Norton MRN: [id number] Date of Birth: [birth year]  Today's Date: 10/08/2015 Time: SLP Start Time (ACUTE ONLY): 1244 SLP Stop Time (ACUTE ONLY): 1311 SLP Time Calculation (min) (ACUTE ONLY): 27 min  Past Medical History: History reviewed. No pertinent past medical history. Past Surgical History:  Past Surgical History  Procedure Laterality Date  . Ankle surgery Right   . Inguinal hernia repair Bilateral 05/20/2015    Procedure: HERNIA REPAIR INGUINAL ADULT BILATERAL;  Surgeon: Abigail Miyamoto, MD;  Location: [facility] OR;  Service: General;  Laterality: Bilateral;  . Insertion of mesh Bilateral 05/20/2015    Procedure: INSERTION OF MESH;  Surgeon: Abigail Miyamoto, MD;  Location: [facility] OR;  Service: General;  Laterality: Bilateral;  . Esophagogastroduodenoscopy N/A 05/26/2015    Procedure: ESOPHAGOGASTRODUODENOSCOPY (EGD);  Surgeon: Vida Rigger, MD;  Location: [facility] ENDOSCOPY;  Service: Endoscopy;  Laterality: N/A;   HPI:  82 yo male adm to [facility] with abdominal pain = Pt with PMH + for colitis recently discharged from hospital, dementia.  Pt is presents with wet gurgly voice and concern for aspiration present.  SLP eval ordered.  Pt has h/o EGD completed 05/2015 showing hiatal hernia and esophagitis.     Assessment / Plan / Recommendation Clinical Impression  Pt presents with indications of mild oral and suspected gross pharyngeal, ? Probable cervical esophageal dysphagia.  Pt is dysarthric with imprecise articulation, decreased phonatory strength resulting in decreased intelligiblity.  Wet vocal quality noted at baseline that did temporarily clear with CUES to clear throat/cough, but quickly recurred.  Minimal intake given due to concern for aspiration.  Mild delay in oral transiting presumed.  Pharyngeal swallow characterized by MULTIPLE swallows across all consistencies (small bolus applesauce, water, ice chip bolus) with post=swallow overt  coughing and increased wet vocal quality.    Pt did cough/expectorate viscous white tinged secretions- ? Pharyngeal secretions and/or esophageal secretions.  Pt reports this to be a chronic issue however given his dementia, uncertain to accuracy of information provided.  Also step daughter in room states gurgly voice has been ongoing but did not provide definitive timeline with direct question.    Note plan for possible GI referral and GI hx- SLP agrees.    Recommend continue NPO with oral care = ice chips for comfort only and SLP follow up dependent on GI findings/ palliative care goals.  Pt is a HIGH aspiration risk and is likely aspirating secretions at this time.  MBS would not lkely change care plan at this time.    RN and pt made aware of recommendations and RN agrees.  Uncertain to level of pt comprehension given his dementia but education will be ongoing.  Skilled intervention included educating pt to findings/recommendations.        Aspiration Risk  Severe aspiration risk;Risk for inadequate nutrition/hydration    Diet Recommendation NPO;Ice chips PRN after oral care   Medication Administration: Via alternative means    Other  Recommendations Recommended Consults: Consider GI evaluation Oral Care Recommendations: Oral care BID   Follow up Recommendations       Frequency and Duration min 2x/week  1 week       Prognosis Prognosis for Safe Diet Advancement: Guarded Barriers to Reach Goals: Severity of deficits;Time post onset      Swallow Study   General Date of Onset: 10/08/15 HPI: 82 yo male adm to [facility] with abdominal pain = Pt with PMH + for colitis recently discharged from hospital, dementia.  Pt  is presents with wet gurgly voice and concern for aspiration present.  SLP eval ordered.   Type of Study: Bedside Swallow Evaluation Diet Prior to this Study: NPO Temperature Spikes Noted: No Respiratory Status: Room air History of Recent Intubation: No Behavior/Cognition:  Alert;Cooperative;Requires cueing;Distractible Oral Cavity Assessment: Excessive secretions Oral Care Completed by SLP: Yes Oral Cavity - Dentition:  (present) Vision: Functional for self-feeding Self-Feeding Abilities: Able to feed self Patient Positioning: Upright in chair Baseline Vocal Quality: Wet Volitional Cough: Strong Volitional Swallow: Able to elicit    Oral/Motor/Sensory Function Overall Oral Motor/Sensory Function: Within functional limits   Ice Chips Ice chips: Impaired Presentation: Spoon Oral Phase Impairments: Reduced lingual movement/coordination;Impaired mastication Oral Phase Functional Implications: Prolonged oral transit Pharyngeal Phase Impairments: Suspected delayed Swallow;Multiple swallows;Wet Vocal Quality;Throat Clearing - Delayed   Thin Liquid Thin Liquid: Impaired Presentation: Spoon Oral Phase Impairments: Reduced lingual movement/coordination Oral Phase Functional Implications: Prolonged oral transit Pharyngeal  Phase Impairments: Suspected delayed Swallow;Multiple swallows;Wet Vocal Quality;Cough - Immediate    Nectar Thick Nectar Thick Liquid: Not tested   Honey Thick Honey Thick Liquid: Not tested   Puree Puree: Impaired Presentation: Spoon;Self Fed Oral Phase Impairments: Reduced lingual movement/coordination Oral Phase Functional Implications: Prolonged oral transit Pharyngeal Phase Impairments: Suspected delayed Swallow;Multiple swallows;Cough - Immediate;Wet Vocal Quality   Solid   GO   Solid: Not tested        Mills Koller, MS [facility] SLP [phone]

## 2015-10-08 NOTE — Evaluation (Signed)
Physical Therapy Evaluation Patient Details Name: Luis Norton MRN: [id number] DOB: 1922-03-29 Today's Date: 10/08/2015   History of Present Illness  Pt adm with N/V due to colitis, sepsis, ARF. PMH - dementia, GI bleed, A-fib, recent admit 1/12-1/14 with colitis  Clinical Impression  Pt admitted with above diagnosis. Pt currently with functional limitations due to the deficits listed below (see PT Problem List). Pt ambulated 280' with RW and min/guard assist. He's reporting 10/10 R ankle pain at rest and with walking, however could fully weight bear on RLE and ankle ROM WFL, ankles do appear swollen, RN aware. Pt will benefit from skilled PT to increase their independence and safety with mobility to allow discharge to the venue listed below.       Follow Up Recommendations No PT follow up    Equipment Recommendations  None recommended by PT    Recommendations for Other Services       Precautions / Restrictions Precautions Precautions: Fall Precaution Comments: family denies falls Restrictions Weight Bearing Restrictions: No      Mobility  Bed Mobility Overal bed mobility: Needs Assistance Bed Mobility: Rolling;Sidelying to Sit Rolling: Mod assist Sidelying to sit: Mod assist       General bed mobility comments: assist to initiate movement and to raise trunk  Transfers Overall transfer level: Modified independent Equipment used: Rolling walker (2 wheeled) Transfers: Sit to/from Stand Sit to Stand: Min guard;From elevated surface         General transfer comment: verbal cues for hand placement  Ambulation/Gait  min/guard assist Ambulation Distance (Feet): 280 Feet Assistive device: Rolling walker (2 wheeled) Gait Pattern/deviations: Step-through pattern   Gait velocity interpretation: at or above normal speed for age/gender General Gait Details: No loss of balance using walker, reports R anle pain, RN aware  Stairs            Wheelchair Mobility     Modified Rankin (Stroke Patients Only)       Balance     Sitting balance-Leahy Scale: Good       Standing balance-Leahy Scale: Fair                               Pertinent Vitals/Pain Pain Assessment: 0-10 Pain Score: 10-Worst pain ever Pain Location: B ankles Pain Descriptors / Indicators: Sore Pain Intervention(s): Monitored during session;Limited activity within patient's tolerance;Patient requesting pain meds-RN notified    Home Living Family/patient expects to be discharged to:: Private residence Living Arrangements: Children Available Help at Discharge: Family;Available PRN/intermittently Type of Home: Apartment Home Access: Ramped entrance     Home Layout: One level Home Equipment: Walker - 2 wheels      Prior Function Level of Independence: Needs assistance   Gait / Transfers Assistance Needed: Per pt he amb independently. Has rolling walker at home but doesn't always use it  ADL's / Homemaking Assistance Needed: reports neice helps with bathing and has a seat in shower        Hand Dominance   Dominant Hand: Right    Extremity/Trunk Assessment   Upper Extremity Assessment: Overall WFL for tasks assessed           Lower Extremity Assessment: Overall WFL for tasks assessed      Cervical / Trunk Assessment: Normal  Communication   Communication: No difficulties  Cognition Arousal/Alertness: Awake/alert Behavior During Therapy: WFL for tasks assessed/performed Overall Cognitive Status: History of cognitive impairments - at baseline  General Comments      Exercises        Assessment/Plan    PT Assessment Patient needs continued PT services  PT Diagnosis Difficulty walking   PT Problem List Decreased balance;Decreased mobility;Decreased knowledge of use of DME;Decreased cognition;Pain  PT Treatment Interventions DME instruction;Gait training;Functional mobility training;Therapeutic  activities;Therapeutic exercise;Balance training;Patient/family education   PT Goals (Current goals can be found in the Care Plan section) Acute Rehab PT Goals Patient Stated Goal: return home PT Goal Formulation: With patient/family Time For Goal Achievement: 10/22/15 Potential to Achieve Goals: Good    Frequency Min 3X/week   Barriers to discharge        Co-evaluation               End of Session Equipment Utilized During Treatment: Gait belt Activity Tolerance: Patient tolerated treatment well Patient left: in chair;with call bell/phone within reach;with chair alarm set;with family/visitor present Nurse Communication: Mobility status         Time: 1209-1233 PT Time Calculation (min) (ACUTE ONLY): 24 min   Charges:   PT Evaluation $PT Eval Low Complexity: 1 Procedure PT Treatments $Gait Training: 8-22 mins   PT G Codes:        Tamala Ser 10/08/2015, 12:48 PM [phone]

## 2015-10-08 NOTE — Consult Note (Signed)
   [facility] CM Inpatient Consult   10/08/2015  Hameed Kolar 1921/10/23 [id number]    [facility] Care Management following patient for services. However, last attempt had been unsuccessful after last hospitalization. Family not at bedside upon bedside visit and patient is resting soundly. Noted palliative care consult is pending. Will continue to follow. Will make inpatient RNCM aware that [facility] is following.   Raiford Noble, MSN-Ed, RN,BSN [facility] Liaison [phone]

## 2015-10-09 DIAGNOSIS — E876 Hypokalemia: Secondary | ICD-10-CM

## 2015-10-09 DIAGNOSIS — D638 Anemia in other chronic diseases classified elsewhere: Secondary | ICD-10-CM

## 2015-10-09 DIAGNOSIS — Z66 Do not resuscitate: Secondary | ICD-10-CM

## 2015-10-09 LAB — GLUCOSE, CAPILLARY: GLUCOSE-CAPILLARY: 71 mg/dL (ref 65–99)

## 2015-10-09 MED ORDER — HALOPERIDOL LACTATE 5 MG/ML IJ SOLN
2.0000 mg | Freq: Once | INTRAMUSCULAR | Status: AC
  Administered 2015-10-09: 2 mg via INTRAMUSCULAR
  Filled 2015-10-09: qty 1

## 2015-10-09 MED ORDER — OXYCODONE HCL 5 MG PO TABS
5.0000 mg | ORAL_TABLET | ORAL | Status: DC | PRN
  Filled 2015-10-09: qty 1

## 2015-10-09 MED ORDER — SENNOSIDES-DOCUSATE SODIUM 8.6-50 MG PO TABS
1.0000 | ORAL_TABLET | Freq: Two times a day (BID) | ORAL | Status: DC
  Administered 2015-10-09 (×2): 1 via ORAL
  Filled 2015-10-09 (×8): qty 1

## 2015-10-09 MED ORDER — OXYCODONE HCL 5 MG PO TABS
5.0000 mg | ORAL_TABLET | ORAL | Status: DC | PRN
  Administered 2015-10-09: 5 mg via ORAL

## 2015-10-09 NOTE — Progress Notes (Addendum)
Patient ID: Luis Norton, male   DOB: [birth year], 82 y.o.   MRN: [id number] [facility] PROGRESS NOTE  Benedicto Capozzi HYQ:[id number] DOB: August 25, 1922 DOA: 10/01/2015 PCP: Kandice Hams, MD  Brief narrative:    82 year old male with history of a fib (not on AC due to risk of GI bleed), hypertension, recent hospitalization for colitis (admission from 1/10 through 1/14). He presented back to [facility] ED with worsening abdominal pain since this recent hospitalization. Pt reported pain to be diffuse, intermittent, 7-8/10 in intensity somewhat better with analgesia given in ED. No fevers. He was still on cipro and flagyl from last admission for colitis. No diarrhea. No blood in stool.   In ED, BP was 129/75, HR 116, RR 24, T max 98.4 F and oxygen saturation was 91-100%. Blood work showed leukocytosis of 19.7, creatinine 1.5, lactic acid was 2.74, mild elevation in procalcitonin level at 0.12. CT abdomen showed interval development of moderate gastric distention as well as mild inflammatory changes in the region of the pancreatic head and proximal duodenum, uncertain if this represents pancreatitis or peptic ulcer disease. He was palced on empiric antibioitcs, ceftaz and flagyl. Blood cultures were obtained, recent C.diff 09/30/2015 was negative.  Barrier to discharge: WBC count trending up, continue current antibiotics. Will most likely need SNF placement as he has had few admissions recently and would be likely readmitted if he were to be discharged home again.    Assessment/Plan:    Principal Problem: Abdominal pain / Sepsis due to possible colitis / Gastritis or PUD / UTI / Leukocytosis  - Sepsis criteria met on the admission with tachycardia, tachypnea, leukocytosis and lactic acidosis  - no clear source of infection at this point  - CT abdomen on admission demonstrated gastric distention as well as mild inflammatory changes in the region of the pancreatic head and proximal duodenum.  - UA on  admission showed small leukocytes on UA so he could have UTI. - Considering he was recently hospitalized for colitis this also is a possibility as a source of infection - We will continue ceftazidime and flagyl - Check CBC in am  - Blood cultures so far show no growth   Active Problems: Aspiration risk / severe protein calorie malnutrition - High risk of aspiration - Will have to address feeding issue in case his diet cannot be advanced beyond NPO - Palliative care to assist with goals of care   Acute renal failure superimposed on CKD stage 3 - Recent Cr 1.5 and on this admission at baseline value - Check BMP tomorrow   Hypokalemia - Likely due to dehydration, GI losses - Supplemented and repeat level WNL  Anemia of chronic disease - Secondary to anemia of chronic kidney disease - Hemoglobin stable at 10.1 - Repeat CBC in am   History of paroxysmal atrial fibrillation - Chads vasc score  3. - Not on AC due to risk of GI bleed  Essential hypertension - Blood pressure controlled without antihypertensive meds   DVT prophylaxis:  - SCD's bilaterally   Code Status: DNR/DNI Family Communication: Family not at the bedside  Disposition Plan: would prefer pt to be discharge to SNF once stable once we know his feeding management, if WBC count better   IV access:  Peripheral IV  Procedures and diagnostic studies:    Ct Abdomen Pelvis Wo Contrast 10/09/2015 Atherosclerosis of abdominal aorta without aneurysm formation. Mild bilateral pleural effusions with adjacent subsegmental atelectasis. Stable moderate prostatic enlargement. Interval development of moderate gastric  distention as well as mild inflammatory changes in the region of the pancreatic head and proximal duodenum. It is uncertain if this represents pancreatitis or peptic ulcer disease. Mildly enlarged lymph nodes are now noted inferior pancreatic head and posterior to distal stomach, which most likely are inflammatory in  etiology. Electronically Signed By: Marijo Conception, M.D. On: 10/06/2015 13:10   Medical Consultants:  PCT  Other Consultants:  PT SLP Nutrition  IAnti-Infectives:   Ceftazidime 09/21/2015 --> Flagyl 10/08/2015 -->   Leisa Lenz, MD  [facility] Pager [phone]  Time spent in minutes: 25 minutes  If 7PM-7AM, please contact night-coverage [email or web address] Password [facility] 10/09/2015, 9:32 AM   LOS: 2 days    HPI/Subjective: No acute overnight events. Patient reports no resp distress.  Objective: Filed Vitals:   10/08/15 0630 10/08/15 1428 10/08/15 2043 10/09/15 0630  BP: 105/64 110/73 91/46 117/79  Pulse: 116 98 98 110  Temp: 98.7 F (37.1 C) 97.9 F (36.6 C) 98 F (36.7 C) 98 F (36.7 C)  TempSrc: Oral Axillary Oral Oral  Resp: '22 20 22 20  '$ Height:      Weight:      SpO2: 100% 100% 99% 97%    Intake/Output Summary (Last 24 hours) at 10/09/15 0932 Last data filed at 10/09/15 0600  Gross per 24 hour  Intake 1375.33 ml  Output   1200 ml  Net 175.33 ml    Exam:   General:  Pt is not in acute distress  Cardiovascular: tachycardic, S1/S2 appreciated   Respiratory: Coarse breath sounds, no wheeze  Abdomen: Bowel sounds present, nontender  Extremities: Palpable pulses, no swelling  Neuro: No focal deficit  Data Reviewed: Basic Metabolic Panel:  Recent Labs Lab 10/03/15 0610 10/11/2015 1132 10/15/2015 2038 10/08/15 0535  NA 137 138 135 137  K 4.3 4.0 3.3* 3.7  CL 106 98* 96* 101  CO2 '26 29 31 28  '$ GLUCOSE 99 134* 145* 135*  BUN 14 25* 22* 24*  CREATININE 1.12 1.50* 1.27* 1.30*  CALCIUM 8.3* 9.2 8.2* 8.5*  MG  --   --  2.0  --   PHOS  --   --  2.9  --    Liver Function Tests:  Recent Labs Lab 10/03/15 0610 10/02/2015 1132 10/03/2015 2038 10/08/15 0535  AST '20 24 16 '$ 14*  ALT 11* 13* 10* 10*  ALKPHOS 98 99 84 76  BILITOT 0.4 0.4 0.6 0.4  PROT 5.8* 7.1 6.0* 5.6*  ALBUMIN 2.7* 3.5 3.0* 2.7*    Recent Labs Lab 10/03/2015 1132  10/08/15 0535  LIPASE 24  --   AMYLASE  --  58   No results for input(s): AMMONIA in the last 168 hours. CBC:  Recent Labs Lab 10/03/15 0610 10/15/2015 1132 10/11/2015 2038 10/08/15 0535  WBC 8.8 19.7* 18.9* 22.4*  NEUTROABS  --  17.5* 16.2*  --   HGB 10.2* 12.3* 11.4* 10.1*  HCT 33.3* 38.3* 35.3* 32.1*  MCV 78.9 77.4* 77.4* 78.9  PLT 270 345 332 296   Cardiac Enzymes: No results for input(s): CKTOTAL, CKMB, CKMBINDEX, TROPONINI in the last 168 hours. BNP: Invalid input(s): POCBNP CBG:  Recent Labs Lab 10/02/15 1713 10/02/15 2200 10/02/15 2306 10/08/15 0733 10/09/15 0821  GLUCAP 116* 106* 119* 119* 71    Recent Results (from the past 240 hour(s))  C difficile quick scan w PCR reflex     Status: None   Collection Time: 09/30/15  8:49 AM  Result Value Ref Range Status   C  Diff antigen NEGATIVE NEGATIVE Final   C Diff toxin NEGATIVE NEGATIVE Final   C Diff interpretation Negative for toxigenic C. difficile  Final  Culture, blood (x 2)     Status: None (Preliminary result)   Collection Time: 10/06/2015  1:20 PM  Result Value Ref Range Status   Specimen Description BLOOD RIGHT ANTECUBITAL  Final   Special Requests BOTTLES DRAWN AEROBIC AND ANAEROBIC 5CC EACH  Final   Culture   Final    NO GROWTH < 24 HOURS Performed at [facility]    Report Status PENDING  Incomplete  Culture, blood (x 2)     Status: None (Preliminary result)   Collection Time: 09/28/2015  2:00 PM  Result Value Ref Range Status   Specimen Description BLOOD LEFT ANTECUBITAL  Final   Special Requests BOTTLES DRAWN AEROBIC AND ANAEROBIC 5CC EACH  Final   Culture   Final    NO GROWTH < 24 HOURS Performed at [facility]    Report Status PENDING  Incomplete     Scheduled Meds: . cefTAZidime (FORTAZ)  IV  2 g Intravenous Q12H  . metronidazole  500 mg Intravenous 3 times per day  . pantoprazole (PROTONIX) IV  40 mg Intravenous Q12H  . sodium chloride  3 mL Intravenous Q12H    Continuous Infusions: . sodium chloride 30 mL/hr at 10/09/15 0051

## 2015-10-09 NOTE — Progress Notes (Signed)
Speech Language Pathology Treatment: Dysphagia  Patient Details Name: Luis Norton MRN: [id number] DOB: May 13, 1922 Today's Date: 10/09/2015 Time: 4098-1191 SLP Time Calculation (min) (ACUTE ONLY): 25 min  Assessment / Plan / Recommendation Clinical Impression  Previous SLP recommended NPO secondary to patient's severe dysphagia, and overall physical decline. This SLP spoke with Palliative Care PA-C, who recently placed him on a Dys 1 (puree), thin liquid diet secondary to patient's son being educated, understanding of and in agreement for patient to have PO's knowing high aspiration risk. Patient exhibited mild, delayed throat clearing with thin liquids, only took two spoonfuls of puree solids before he declined further.    HPI  N/A      SLP Plan  Discharge SLP treatment due to (comment) (Patient is under Palliative care and although recommendation would be to remain NPO, patient and family choosing to have least restrictive oral diet with known aspiration risk)     Recommendations  Diet recommendations: Dysphagia 1 (puree);Thin liquid (this is least restrictive oral diet with patient's son aware and educated regarding fact that patient is not currently safe for PO's but is choosing to have PO's with known aspiration risks) Liquids provided via: Cup Medication Administration: Via alternative means Supervision: Staff to assist with self feeding;Full supervision/cueing for compensatory strategies Compensations: Minimize environmental distractions;Slow rate;Small sips/bites Postural Changes and/or Swallow Maneuvers: Seated upright 90 degrees;Upright 30-60 min after meal             Plan: Discharge SLP treatment due to (comment) (Patient is under Palliative care and although recommendation would be to remain NPO, patient and family choosing to have least restrictive oral diet with known aspiration risk)     GO                Pablo Lawrence 10/09/2015, 3:45 PM   Angela Nevin, MA, CCC-SLP 10/09/2015 3:50 PM

## 2015-10-09 NOTE — Care Management Important Message (Signed)
Important Message  Patient Details  Name: Cristopher Ciccarelli MRN: [id number] Date of Birth: 06-06-1922   Medicare Important Message Given:  Yes    Haskell Flirt 10/09/2015, 12:14 PMImportant Message  Patient Details  Name: Jarin Cornfield MRN: [id number] Date of Birth: 11-19-1921   Medicare Important Message Given:  Yes    Haskell Flirt 10/09/2015, 12:14 PM

## 2015-10-09 NOTE — Progress Notes (Signed)
Daily Progress Note   Patient Name: Luis Norton       Date: 10/09/2015 DOB: [birth year]  Age: 82 y.o. MRN#: [id number] Attending Physician: Robbie Lis, MD Primary Care Physician: Kandice Hams, MD Admit Date: 09/27/2015  Reason for Consultation/Follow-up: Disposition, Establishing goals of care and Hospice Evaluation  Subjective: Patient more alert.  Reports diffuse stomach pain worse in the LLQ.  Attempting to frequently suction secretions.  Interval Events: Met with family.  Fabio Pierce, Gordan Payment, and Maggie.   Per patient and family Tiara and Waunita Schooner have [place] paperwork.  The family agrees that decisions will be made with the input of Janeal Holmes, Reginold Agent and Nicole Kindred.    Family is aware he is aspirating.  I explained that if he eats there is a very high likelihood he will develop pneumonia.  They understood this and said it had been explained to them before.  From an aspiration perspective, the family's preference is that he be given the safest diet possible and be allowed to eat.   They are concerned about his stomach pain.  They confirmed that he has not had any GI follow up since the 05/2015 hospitalization.  Patient is currently being evaluated for SNF rehab.  The family was pleased with [facility] after his discharge in August and would be happy if he was able to return there.  We also discussed they he may be eligible for Hospice Services in the home after SNF or if he does not end up going to SNF.      Length of Stay: 2 days  Current Medications: Scheduled Meds:  . cefTAZidime (FORTAZ)  IV  2 g Intravenous Q12H  . metronidazole  500 mg Intravenous 3 times per day  . pantoprazole (PROTONIX) IV  40 mg Intravenous Q12H  . sodium chloride  3 mL Intravenous Q12H     Continuous Infusions: . sodium chloride 30 mL/hr at 10/09/15 0051    PRN Meds: acetaminophen **OR** acetaminophen, hydrALAZINE, HYDROmorphone (DILAUDID) injection, ondansetron **OR** ondansetron (ZOFRAN) IV  Physical Exam: Physical Exam      Wd, pleasant male, eyes closed, gurgling and suctioning secretions. NAD Resp:  No increased work of breathing Abdomen:  Slightly distended, diffusely tender to light palpation, no masses identified. Extremities:  No edema.  Vital Signs: BP 117/79 mmHg  Pulse 110  Temp(Src) 98 F (36.7 C) (Oral)  Resp 20  Ht '5\' 8"'$  (1.727 m)  Wt 68.493 kg (151 lb)  BMI 22.96 kg/m2  SpO2 97% SpO2: SpO2: 97 % O2 Device: O2 Device: Nasal Cannula O2 Flow Rate: O2 Flow Rate (L/min): 2 L/min  Intake/output summary:  Intake/Output Summary (Last 24 hours) at 10/09/15 1246 Last data filed at 10/09/15 0600  Gross per 24 hour  Intake 1325.33 ml  Output   1200 ml  Net 125.33 ml   LBM: Last BM Date: 10/15/2015 Baseline Weight: Weight: 68.493 kg (151 lb) Most recent weight: Weight: 68.493 kg (151 lb)       Palliative Assessment/Data: Flowsheet Rows        Most Recent Value   Intake Tab    Referral Department  Hospitalist   Unit at Time of Referral  Med/Surg Unit   Palliative Care Primary Diagnosis  Sepsis/Infectious Disease   Date Notified  10/08/15   Palliative Care Type  Return patient Palliative Care   Reason for referral  Pain, Non-pain Symptom, Clarify Goals of Care   Date of Admission  09/24/2015   Date first seen by Palliative Care  10/08/15   # of days Palliative referral response time  0 Day(s)   # of days IP prior to Palliative referral  1   Clinical Assessment    Palliative Performance Scale Score  30%   Pain Max last 24 hours  8   Pain Min Last 24 hours  4   Psychosocial & Spiritual Assessment    Palliative Care Outcomes       Additional Data Reviewed: CBC    Component Value Date/Time   WBC 22.4* 10/08/2015 0535   RBC 4.07*  10/08/2015 0535   HGB 10.1* 10/08/2015 0535   HCT 32.1* 10/08/2015 0535   PLT 296 10/08/2015 0535   MCV 78.9 10/08/2015 0535   MCH 24.8* 10/08/2015 0535   MCHC 31.5 10/08/2015 0535   RDW 17.1* 10/08/2015 0535   LYMPHSABS 0.8 10/15/2015 2038   MONOABS 1.8* 10/15/2015 2038   EOSABS 0.0 10/01/2015 2038   BASOSABS 0.0 10/14/2015 2038    CMP     Component Value Date/Time   NA 137 10/08/2015 0535   K 3.7 10/08/2015 0535   CL 101 10/08/2015 0535   CO2 28 10/08/2015 0535   GLUCOSE 135* 10/08/2015 0535   BUN 24* 10/08/2015 0535   CREATININE 1.30* 10/08/2015 0535   CALCIUM 8.5* 10/08/2015 0535   PROT 5.6* 10/08/2015 0535   ALBUMIN 2.7* 10/08/2015 0535   AST 14* 10/08/2015 0535   ALT 10* 10/08/2015 0535   ALKPHOS 76 10/08/2015 0535   BILITOT 0.4 10/08/2015 0535   GFRNONAA 46* 10/08/2015 0535   GFRAA 54* 10/08/2015 0535       Problem List:  Patient Active Problem List   Diagnosis Date Noted  . At risk for aspiration 10/08/2015  . Anemia of chronic disease 10/08/2015  . Hypokalemia 10/08/2015  . Palliative care encounter   . DNR (do not resuscitate)   . Generalized abdominal pain 09/25/2015  . Paroxysmal atrial fibrillation ([place]) 09/29/2015  . UTI (urinary tract infection) 09/26/2015  . Leukocytosis 10/05/2015  . Acute renal failure superimposed on stage 3 chronic kidney disease ([place]) 10/09/2015  . Colitis 09/29/2015  . Essential hypertension 05/17/2015     Palliative Care Assessment & Plan    1.Code Status:  DNR    Code Status Orders  Start     Ordered   10/06/2015 2046  Do not attempt resuscitation (DNR)   Continuous    Question Answer Comment  In the event of cardiac or respiratory ARREST Do not call a "code blue"   In the event of cardiac or respiratory ARREST Do not perform Intubation, CPR, defibrillation or ACLS   In the event of cardiac or respiratory ARREST Use medication by any route, position, wound care, and other measures to relive pain and  suffering. May use oxygen, suction and manual treatment of airway obstruction as needed for comfort.      09/21/2015 2045    Code Status History    Date Active Date Inactive Code Status Order ID Comments User Context   09/24/2015  8:00 PM 10/19/2015  8:45 PM Full Code [id number]  Robbie Lis, MD Inpatient   09/30/2015  1:42 AM 10/03/2015  5:28 PM DNR [id number]  Rise Patience, MD Inpatient   05/25/2015  1:31 PM 05/28/2015 10:26 PM Partial Code [id number]  Knox Royalty, NP Inpatient   05/24/2015  5:25 PM 05/25/2015  1:31 PM Full Code [id number]  Kelvin Cellar, MD ED   05/16/2015 11:55 PM 05/22/2015  5:19 PM Full Code [id number]  Reubin Milan, MD Inpatient       2. Goals of Care/Additional Recommendations:   Limitations on Scope of Treatment: DNR / DNI / No PEG   Family accepts risk of aspiration.  SLP to recommend safest diet.  Desire for further Chaplaincy support: No  Psycho-social Needs: Caregiving  Support/Resources and Education on Hospice  3. Symptom Management:      Will add oxy ir 5 mg q4 prn stomach pain.  Hold for ams or lethargy.   Added Senna/s as well with opiate.   4. Palliative Prophylaxis:   Aspiration and Frequent Pain Assessment  5. Prognosis: Given weight loss of 25+ lbs, recurrent infections and hospitalizations, dysphagia with high risk of aspiration, atrial fibrillation without anticoagulation, and cognitive impairment patient is likely eligible for Hospice Services in his home.   He likely has 6 months or less and is at risk for an acute event.  6. Discharge Planning: Recommend D/C to SNF with Palliative Medicine to follow vs D/C to home with family and home hospice services.   Care plan was discussed with family, RN, Dr. Charlies Silvers.  Thank you for allowing the Palliative Medicine Team to assist in the care of this patient.   Time In: 1250 Time Out: 1332 Total Time 35 min Prolonged Time Billed   no        Imogene Burn, [place] [facility] Pager:  [phone]  10/09/2015, 12:46 PM  Please contact Palliative Medicine Team phone at [phone] for questions and concerns.

## 2015-10-09 NOTE — Progress Notes (Signed)
PT Cancellation Note  Patient Details Name: Danyel Griess MRN: [id number] DOB: 11-13-1921   Cancelled Treatment:    Reason Eval/Treat Not Completed: Fatigue/lethargy limiting ability to participate (pt is groggy from taking Haldol. Per RN he pulled out his IV and was attempting to go home. Will attempt later today per RN request. )   Tamala Ser 10/09/2015, 11:56 AM [phone]

## 2015-10-09 NOTE — Progress Notes (Addendum)
Physical Therapy Treatment Patient Details Name: Luis Norton MRN: [id number] DOB: 06-Nov-1921 Today's Date: 10/09/2015    History of Present Illness Pt adm with N/V due to colitis, sepsis, ARF. PMH - dementia, GI bleed, A-fib, recent admit 1/12-1/14 with colitis    PT Comments    Spoke to palliative MD who reports pt's family is interested in SNF as they aren't able to care for pt and pt has had 4 hospital admissions in past 6 months. Mod assist for bed mobility, min/guard for ambulating 200' with RW, no loss of balance. PT now recommending SNF.  Follow Up Recommendations  SNF     Equipment Recommendations  None recommended by PT    Recommendations for Other Services       Precautions / Restrictions Precautions Precautions: Fall Precaution Comments: family denies h/o falls Restrictions Weight Bearing Restrictions: No    Mobility  Bed Mobility Overal bed mobility: Needs Assistance Bed Mobility: Supine to Sit     Supine to sit: Mod assist     General bed mobility comments: assist to initiate movement and to raise trunk  Transfers Overall transfer level: Modified independent Equipment used: Rolling walker (2 wheeled)   Sit to Stand: Min guard;From elevated surface         General transfer comment: verbal cues for hand placement  Ambulation/Gait Ambulation/Gait assistance: Min guard Ambulation Distance (Feet): 200 Feet Assistive device: Rolling walker (2 wheeled) Gait Pattern/deviations: Step-through pattern   Gait velocity interpretation: at or above normal speed for age/gender General Gait Details: No loss of balance using walker, pt reports he "feels terrible" while walking at rest, main complaint is pain in abdomen, RN notified Attempted to obtain SaO2 with walking on RA, pulse ox did not get a reading.    Stairs            Wheelchair Mobility    Modified Rankin (Stroke Patients Only)       Balance     Sitting balance-Leahy Scale: Good        Standing balance-Leahy Scale: Fair                      Cognition Arousal/Alertness: Awake/alert Behavior During Therapy: WFL for tasks assessed/performed Overall Cognitive Status: History of cognitive impairments - at baseline                      Exercises      General Comments        Pertinent Vitals/Pain Pain Score: 9  Pain Location: abdomen Pain Descriptors / Indicators: Sore Pain Intervention(s): Patient requesting pain meds-RN notified;Limited activity within patient's tolerance;Monitored during session    Home Living                      Prior Function            PT Goals (current goals can now be found in the care plan section) Acute Rehab PT Goals Patient Stated Goal: return home PT Goal Formulation: With patient/family Time For Goal Achievement: 10/22/15 Potential to Achieve Goals: Good Progress towards PT goals: Progressing toward goals    Frequency  Min 3X/week    PT Plan Discharge plan needs to be updated    Co-evaluation             End of Session Equipment Utilized During Treatment: Gait belt Activity Tolerance: Patient tolerated treatment well Patient left: in chair;with call bell/phone within reach;with chair alarm set;with family/visitor  present     Time: 1610-9604 PT Time Calculation (min) (ACUTE ONLY): 14 min  Charges:  $Gait Training: 8-22 mins                    G Codes:      Tamala Ser 10/09/2015, 1:41 PM [phone]

## 2015-10-10 LAB — BASIC METABOLIC PANEL
Anion gap: 18 — ABNORMAL HIGH (ref 5–15)
BUN: 33 mg/dL — ABNORMAL HIGH (ref 6–20)
CHLORIDE: 102 mmol/L (ref 101–111)
CO2: 17 mmol/L — AB (ref 22–32)
Calcium: 8.6 mg/dL — ABNORMAL LOW (ref 8.9–10.3)
Creatinine, Ser: 2.78 mg/dL — ABNORMAL HIGH (ref 0.61–1.24)
GFR calc non Af Amer: 18 mL/min — ABNORMAL LOW (ref >60)
GFR, EST AFRICAN AMERICAN: 21 mL/min — AB (ref >60)
Glucose, Bld: 116 mg/dL — ABNORMAL HIGH (ref 65–99)
POTASSIUM: 5.4 mmol/L — AB (ref 3.5–5.1)
SODIUM: 137 mmol/L (ref 135–145)

## 2015-10-10 LAB — CBC
HEMATOCRIT: 37.1 % — AB (ref 39.0–52.0)
HEMOGLOBIN: 11.3 g/dL — AB (ref 13.0–17.0)
MCH: 24.1 pg — ABNORMAL LOW (ref 26.0–34.0)
MCHC: 30.5 g/dL (ref 30.0–36.0)
MCV: 79.3 fL (ref 78.0–100.0)
Platelets: 136 10*3/uL — ABNORMAL LOW (ref 150–400)
RBC: 4.68 MIL/uL (ref 4.22–5.81)
RDW: 17.3 % — ABNORMAL HIGH (ref 11.5–15.5)
WBC: 17.8 10*3/uL — ABNORMAL HIGH (ref 4.0–10.5)

## 2015-10-10 LAB — GLUCOSE, CAPILLARY
GLUCOSE-CAPILLARY: 115 mg/dL — AB (ref 65–99)
GLUCOSE-CAPILLARY: 118 mg/dL — AB (ref 65–99)

## 2015-10-10 MED ORDER — DEXTROSE 5 % IV SOLN
1.0000 g | INTRAVENOUS | Status: DC
  Administered 2015-10-11: 1 g via INTRAVENOUS
  Filled 2015-10-10 (×2): qty 1

## 2015-10-10 NOTE — Progress Notes (Signed)
Pharmacy Antibiotic Follow-up Note  Jaymason Ledesma is a 82 y.o. year-old male admitted on Nov 01, 2015.  The patient was initially started on vancomycin, Levaquin, and aztreonam for rule out sepsis.  He presents with worsening abdominal pain after being discharged from hospital last week for colitis on cipro and flagyl.  Now, antibiotics are changed to ceftazidime and metronidazole for recurrent colitis.  Noted PCN allergy (swelling), but pt has previously tolerated Ceftazidime (05/2015) without documented reaction.  Assessment/Plan:  Changed ceftazidime 1g IV q24h due to rise in SCr  Continue metronidazole  IV q8h  Follow up renal fxn, culture results, and clinical course.    Temp (24hrs), Avg:98 F (36.7 C), Min:97.9 F (36.6 C), Max:98.1 F (36.7 C)   Recent Labs Lab 11-01-15 1132 Nov 01, 2015 2038 10/08/15 0535 10/10/15 0537  WBC 19.7* 18.9* 22.4* 17.8*     Recent Labs Lab 2015-11-01 1132 11-01-2015 2038 10/08/15 0535 10/10/15 0537  CREATININE 1.50* 1.27* 1.30* 2.78*   Estimated Creatinine Clearance: 16.7 mL/min (by C-G formula based on Cr of 2.78).    Allergies  Allergen Reactions  . Penicillins Swelling    Has patient had a PCN reaction causing immediate rash, facial/tongue/throat swelling, SOB or lightheadedness with hypotension: YES Has patient had a PCN reaction causing severe rash involving mucus membranes or skin necrosis: NO Has patient had a PCN reaction that required hospitalization NO Has patient had a PCN reaction occurring within the last 10 years: NO If all of the above answers are "NO", then may proceed with Cephalosporin use.    Antimicrobials this admission: 1/18 >> vanc >> 1/18 1/18 >> aztreonam >>  1/18 1/18 >> levaquin >> 1/18 1/18 >> Ceftazidime >> 1/18 >> metronidazole >>  Levels/dose changes this admission:  None  Microbiology results: 1/18 BCx: ngtd  Thank you for allowing pharmacy to be a part of this patient's care.   Hessie Knows, PharmD, BCPS Pager [phone] 10/10/2015 10:56 AM

## 2015-10-10 NOTE — Progress Notes (Signed)
Pt harder to arouse at 0500 than usual.  Pt began eating and drinking yesterday and he definitely aspirates frequently.  Tried to NTS suction, did not get anything.  Pt heart rate up, afib, BP decreased and the urine output has decreased, however, he has condom cath, and it leaked once so not a very acurate record of amt of urine made. Lab tech asked his name and birthday and he answered correctly.  Will continue to keep HOB >30 degrees and monitor for decreased LOC.   Barnett Hatter P

## 2015-10-10 NOTE — Progress Notes (Addendum)
Patient ID: Luis Norton, male   DOB: 03/07/1922, 82 y.o.   MRN: [id number] [facility] PROGRESS NOTE  Luis Norton XLK:[id number] DOB: 1922/07/22 DOA: 10/01/2015 PCP: Kandice Hams, MD  Brief narrative:    82 year old male with history of a fib (not on AC due to risk of GI bleed), hypertension, recent hospitalization for colitis (admission from 1/10 through 1/14). He presented back to [facility] ED with worsening abdominal pain since this recent hospitalization. Pt reported pain to be diffuse, intermittent, 7-8/10 in intensity somewhat better with analgesia given in ED. No fevers. He was still on cipro and flagyl from last admission for colitis. No diarrhea. No blood in stool.   In ED, BP was 129/75, HR 116, RR 24, T max 98.4 F and oxygen saturation was 91-100%. Blood work showed leukocytosis of 19.7, creatinine 1.5, lactic acid was 2.74, mild elevation in procalcitonin level at 0.12. CT abdomen showed interval development of moderate gastric distention as well as mild inflammatory changes in the region of the pancreatic head and proximal duodenum, uncertain if this represents pancreatitis or peptic ulcer disease. He was palced on empiric antibioitcs, ceftaz and flagyl. Blood cultures were obtained, recent C.diff 09/30/2015 was negative.    Assessment/Plan:    Principal Problem: Abdominal pain / Sepsis due to possible colitis / Gastritis or PUD / UTI / Leukocytosis  - Sepsis criteria met on the admission with tachycardia, tachypnea, leukocytosis and lactic acidosis  - UTI is possibility as a source of infection - CT abdomen on admission demonstrated gastric distention as well as mild inflammatory changes in the region of the pancreatic head and proximal duodenum.  - Patient is on empiric ceftaz abdomen Flagyl - No fever with an past 24 hours and white blood cell count is now trending down - Blood cultures negative so far  Active Problems: Aspiration risk / severe protein calorie  malnutrition - High risk of aspiration - Appreciate palliative care addressing goals of care - Family prefers comfort feedings  Acute renal failure superimposed on CKD stage 3 - Recent Cr 1.5 and on this admission Cr was at baseline values - This morning, creatinine is further drop 2.78 likely prerenal from dehydration, poor by mouth intake  Hypokalemia / hyperkalemia - Initially hypokalemic likely because of GI losses. He was supplemented and now he is hyperkalemic.  - Repeat potassium level   Anemia of chronic disease - Secondary to anemia of chronic kidney disease - Hemoglobin is 11.3  History of paroxysmal atrial fibrillation - Chads vasc score  3. - Not on AC due to risk of GI bleed  Essential hypertension - Blood pressure controlled without antihypertensive meds  - Blood pressure this morning 103/60   History of peptic ulcer disease - Continue Protonix 40 mg IV every 12 hours  DVT prophylaxis:  - SCD's bilaterally   Code Status: DNR/DNI Family Communication: Family not at the bedside  Disposition Plan: Home probably by Monday, 10/21/2015   IV access:  Peripheral IV  Procedures and diagnostic studies:    Ct Abdomen Pelvis Wo Contrast 10/20/2015 Atherosclerosis of abdominal aorta without aneurysm formation. Mild bilateral pleural effusions with adjacent subsegmental atelectasis. Stable moderate prostatic enlargement. Interval development of moderate gastric distention as well as mild inflammatory changes in the region of the pancreatic head and proximal duodenum. It is uncertain if this represents pancreatitis or peptic ulcer disease. Mildly enlarged lymph nodes are now noted inferior pancreatic head and posterior to distal stomach, which most likely are inflammatory in etiology.  Electronically Signed By: Marijo Conception, M.D. On: 09/26/2015 13:10   Medical Consultants:  PCT  Other Consultants:  PT SLP Nutrition  IAnti-Infectives:   Ceftazidime 10/04/2015  --> Flagyl 09/24/2015 -->   Leisa Lenz, MD  [facility] Pager [phone]  Time spent in minutes: 15 minutes  If 7PM-7AM, please contact night-coverage [email or web address] Password [facility] 10/10/2015, 8:19 AM   LOS: 3 days    HPI/Subjective: No acute overnight events. No respiratory distress.  Objective: Filed Vitals:   10/09/15 0630 10/09/15 1411 10/09/15 2103 10/10/15 0507  BP: 117/79 95/46 105/72 103/60  Pulse: 110 72 45 120  Temp: 98 F (36.7 C) 98.1 F (36.7 C) 97.9 F (36.6 C) 97.9 F (36.6 C)  TempSrc: Oral Oral Oral Oral  Resp: _0 Height:      Weight:      SpO2: 97% 93% 100% 97%    Intake/Output Summary (Last 24 hours) at 10/10/15 0819 Last data filed at 10/10/15 0600  Gross per 24 hour  Intake   1480 ml  Output    175 ml  Net   1305 ml    Exam:   General:  Pt is sleeping, no distress  Cardiovascular: Appreciate S1-S2, rate controlled   Respiratory: coarse, no wheezing   Abdomen: nontender abdomen, appreciate bowel sounds   Extremities: no edema, pulses palpable bilaterally   Neuro: nonfocal   Data Reviewed: Basic Metabolic Panel:  Recent Labs Lab 10/14/2015 1132 10/18/2015 2038 10/08/15 0535 10/10/15 0537  NA 138 135 137 137  K 4.0 3.3* 3.7 5.4*  CL 98* 96* 101 102  CO2 _1 17*  GLUCOSE 134* 145* 135* 116*  BUN 25* 22* 24* 33*  CREATININE 1.50* 1.27* 1.30* 2.78*  CALCIUM 9.2 8.2* 8.5* 8.6*  MG  --  2.0  --   --   PHOS  --  2.9  --   --    Liver Function Tests:  Recent Labs Lab 09/27/2015 1132 09/24/2015 2038 10/08/15 0535  AST 24 16 14*  ALT 13* 10* 10*  ALKPHOS 99 84 76  BILITOT 0.4 0.6 0.4  PROT 7.1 6.0* 5.6*  ALBUMIN 3.5 3.0* 2.7*    Recent Labs Lab 09/30/2015 1132 10/08/15 0535  LIPASE 24  --   AMYLASE  --  58   No results for input(s): AMMONIA in the last 168 hours. CBC:  Recent Labs Lab 10/16/2015 1132 10/10/2015 2038 10/08/15 0535 10/10/15 0537  WBC 19.7* 18.9* 22.4* 17.8*  NEUTROABS 17.5*  16.2*  --   --   HGB 12.3* 11.4* 10.1* 11.3*  HCT 38.3* 35.3* 32.1* 37.1*  MCV 77.4* 77.4* 78.9 79.3  PLT 345 332 296 136*   Cardiac Enzymes: No results for input(s): CKTOTAL, CKMB, CKMBINDEX, TROPONINI in the last 168 hours. BNP: Invalid input(s): POCBNP CBG:  Recent Labs Lab 10/08/15 0733 10/09/15 0821 10/10/15 0444  GLUCAP 119* 71 115*    Recent Results (from the past 240 hour(s))  C difficile quick scan w PCR reflex     Status: None   Collection Time: 09/30/15  8:49 AM  Result Value Ref Range Status   C Diff antigen NEGATIVE NEGATIVE Final   C Diff toxin NEGATIVE NEGATIVE Final   C Diff interpretation Negative for toxigenic C. difficile  Final  Culture, blood (x 2)     Status: None (Preliminary result)   Collection Time: 09/26/2015  1:20 PM  Result Value Ref Range Status   Specimen Description BLOOD RIGHT ANTECUBITAL  Final   Special Requests BOTTLES DRAWN AEROBIC AND ANAEROBIC 5CC EACH  Final   Culture   Final    NO GROWTH 2 DAYS Performed at [facility]    Report Status PENDING  Incomplete  Culture, blood (x 2)     Status: None (Preliminary result)   Collection Time: 10/19/2015  2:00 PM  Result Value Ref Range Status   Specimen Description BLOOD LEFT ANTECUBITAL  Final   Special Requests BOTTLES DRAWN AEROBIC AND ANAEROBIC 5CC EACH  Final   Culture   Final    NO GROWTH 2 DAYS Performed at [facility]    Report Status PENDING  Incomplete     Scheduled Meds: . cefTAZidime (FORTAZ)  IV  2 g Intravenous Q12H  . metronidazole  500 mg Intravenous 3 times per day  . pantoprazole (PROTONIX) IV  40 mg Intravenous Q12H  . senna-docusate  1 tablet Oral BID  . sodium chloride  3 mL Intravenous Q12H   Continuous Infusions: . sodium chloride 30 mL/hr at 10/09/15 0051

## 2015-10-11 LAB — BASIC METABOLIC PANEL
ANION GAP: 24 — AB (ref 5–15)
BUN: 45 mg/dL — ABNORMAL HIGH (ref 6–20)
CO2: 13 mmol/L — AB (ref 22–32)
Calcium: 8 mg/dL — ABNORMAL LOW (ref 8.9–10.3)
Chloride: 101 mmol/L (ref 101–111)
Creatinine, Ser: 4.5 mg/dL — ABNORMAL HIGH (ref 0.61–1.24)
GFR calc Af Amer: 12 mL/min — ABNORMAL LOW (ref >60)
GFR calc non Af Amer: 10 mL/min — ABNORMAL LOW (ref >60)
GLUCOSE: 41 mg/dL — AB (ref 65–99)
POTASSIUM: 5.9 mmol/L — AB (ref 3.5–5.1)
Sodium: 138 mmol/L (ref 135–145)

## 2015-10-11 LAB — CBC
HCT: 35.6 % — ABNORMAL LOW (ref 39.0–52.0)
Hemoglobin: 11.3 g/dL — ABNORMAL LOW (ref 13.0–17.0)
MCH: 24.9 pg — AB (ref 26.0–34.0)
MCHC: 31.7 g/dL (ref 30.0–36.0)
MCV: 78.4 fL (ref 78.0–100.0)
PLATELETS: 106 10*3/uL — AB (ref 150–400)
RBC: 4.54 MIL/uL (ref 4.22–5.81)
RDW: 17.1 % — ABNORMAL HIGH (ref 11.5–15.5)
WBC: 21 10*3/uL — ABNORMAL HIGH (ref 4.0–10.5)

## 2015-10-11 LAB — GLUCOSE, CAPILLARY: Glucose-Capillary: 108 mg/dL — ABNORMAL HIGH (ref 65–99)

## 2015-10-11 MED ORDER — DEXTROSE 50 % IV SOLN
50.0000 mL | Freq: Once | INTRAVENOUS | Status: AC
  Administered 2015-10-11: 50 mL via INTRAVENOUS
  Filled 2015-10-11: qty 50

## 2015-10-11 MED ORDER — SODIUM CHLORIDE 0.9 % IV SOLN
1.0000 g | Freq: Once | INTRAVENOUS | Status: AC
  Administered 2015-10-11: 1 g via INTRAVENOUS
  Filled 2015-10-11: qty 10

## 2015-10-11 MED ORDER — DEXTROSE 50 % IV SOLN
INTRAVENOUS | Status: AC
  Filled 2015-10-11: qty 50

## 2015-10-11 MED ORDER — FUROSEMIDE 10 MG/ML IJ SOLN
20.0000 mg | Freq: Once | INTRAMUSCULAR | Status: AC
  Administered 2015-10-11: 20 mg via INTRAVENOUS
  Filled 2015-10-11: qty 2

## 2015-10-11 NOTE — Progress Notes (Signed)
Patient's daughter in room and very upset about patient's condition.  She stated she will follow up with MD tomorrow.  Will have next shift follow up.

## 2015-10-11 NOTE — Progress Notes (Signed)
Patient has been very lethargic and slept through all meals.  MD aware.

## 2015-10-11 NOTE — Progress Notes (Addendum)
Patient ID: Luis Norton, male   DOB: Nov 16, 1921, 82 y.o.   MRN: [id number] [facility] PROGRESS NOTE  Sohum Delillo [id number]:[id number] DOB: [birth year] DOA: 10/15/2015 PCP: Kandice Hams, MD  Brief narrative:    82 year old male with history of a fib (not on AC due to risk of GI bleed), hypertension, recent hospitalization for colitis (admission from 1/10 through 1/14). He presented back to [facility] ED with worsening abdominal pain since this recent hospitalization. Pt reported pain to be diffuse, intermittent, 7-8/10 in intensity somewhat better with analgesia given in ED. No fevers. He was still on cipro and flagyl from last admission for colitis. No diarrhea. No blood in stool.   In ED, BP was 129/75, HR 116, RR 24, T max 98.4 F and oxygen saturation was 91-100%. Blood work showed leukocytosis of 19.7, creatinine 1.5, lactic acid was 2.74, mild elevation in procalcitonin level at 0.12. CT abdomen showed interval development of moderate gastric distention as well as mild inflammatory changes in the region of the pancreatic head and proximal duodenum, uncertain if this represents pancreatitis or peptic ulcer disease. He was palced on empiric antibioitcs, ceftaz and flagyl. Blood cultures were obtained, recent C.diff 09/30/2015 was negative.  Barrier to discharge: Appreciate palliative care assistance in addressing the goals of care. Appreciate help in addressing the following issues: Further need for antibiotics, continuing to monitor blood work and transition to full comfort care. Patient continues to be lethargic, has poor by mouth intake, his leukocytosis is worse, his renal function is worsening with other electrolyte abnormalities. Transition to full comfort care may be reasonable at this point but will see with palliative care and their thoughts.  Assessment/Plan:    Principal Problem: Abdominal pain / Sepsis due to possible colitis / Gastritis or PUD / UTI / Leukocytosis  - Sepsis criteria met  on the admission with tachycardia, tachypnea, leukocytosis and lactic acidosis  - UTI possible source of infection versus aspiration pneumonitis versus colitis - CT abdomen on admission demonstrated gastric distention as well as mild inflammatory changes in the region of the pancreatic head and proximal duodenum.  - Will continue current antibiotics however appreciate palliative care addressing the issue of ongoing antibiotic treatment in light of worsening renal failure, poor by mouth intake - Blood cultures negative so far  Active Problems: Aspiration risk / severe protein calorie malnutrition - High risk of aspiration - Appreciate palliative care addressing goals of care - Family prefers comfort feedings  Acute renal failure superimposed on CKD stage 3 - Recent Cr 1.5 and on this admission Cr was at baseline values - Worsening renal function. Appreciate palliative care continuing to address goals of care. We'll see if family agreeable to transition to complete comfort without further blood work.  Hypokalemia / hyperkalemia - Initially hypokalemic likely because of GI losses.  - Potassium then supplemented and now hyperkalemic at 5.4 and repeat level 5.9 - Give calcium gluconate and low-dose Lasix today  Anemia of chronic disease - Secondary to anemia of chronic kidney disease - Hemoglobin stable  History of paroxysmal atrial fibrillation - Chads vasc score  3. - Not on AC due to risk of GI bleed  Essential hypertension - Blood pressure controlled - Not on any BP meds   History of peptic ulcer disease - Continue Protonix 40 mg IV every 12 hours  DVT prophylaxis:  - SCD's bilaterally in hospital   Code Status: DNR/DNI Family Communication: Family not at the bedside  Disposition Plan: Appreciate SW assistance with  placement   IV access:  Peripheral IV  Procedures and diagnostic studies:    Ct Abdomen Pelvis Wo Contrast 09/24/2015 Atherosclerosis of abdominal aorta  without aneurysm formation. Mild bilateral pleural effusions with adjacent subsegmental atelectasis. Stable moderate prostatic enlargement. Interval development of moderate gastric distention as well as mild inflammatory changes in the region of the pancreatic head and proximal duodenum. It is uncertain if this represents pancreatitis or peptic ulcer disease. Mildly enlarged lymph nodes are now noted inferior pancreatic head and posterior to distal stomach, which most likely are inflammatory in etiology. Electronically Signed By: Marijo Conception, M.D. On: 09/26/2015 13:10   Medical Consultants:  PCT  Other Consultants:  PT SLP Nutrition  IAnti-Infectives:   Ceftazidime 09/24/2015 --> Flagyl 10/02/2015 -->   Leisa Lenz, MD  [facility] Pager [phone]  Time spent in minutes: 15 minutes  If 7PM-7AM, please contact night-coverage [email or web address] Password [facility] 10/11/2015, 9:36 AM   LOS: 4 days    HPI/Subjective: No acute overnight events. No agitation.  Objective: Filed Vitals:   10/10/15 1400 10/10/15 2323 10/11/15 0135 10/11/15 0502  BP: 92/50 132/81 93/53 106/57  Pulse: 82 85 68 93  Temp: 98.4 F (36.9 C) 97.9 F (36.6 C) 97.7 F (36.5 C) 98.1 F (36.7 C)  TempSrc: Axillary Axillary Axillary Axillary  Resp: _0 Height:      Weight:      SpO2: 99% 97% 94% 100%    Intake/Output Summary (Last 24 hours) at 10/11/15 0936 Last data filed at 10/11/15 0503  Gross per 24 hour  Intake      0 ml  Output      0 ml  Net      0 ml    Exam:   General:  Pt is sleeping, opens eyes briefly with sternal rub  Cardiovascular: Rate controlled, (+) S1,. S2   Respiratory: No wheezing or rhonchi  Abdomen: Appreciate bowel sounds, nontender  Extremities: No leg swelling, palpable pulses bilaterally  Neuro: No focal deficits  Data Reviewed: Basic Metabolic Panel:  Recent Labs Lab 10/17/2015 1132 09/24/2015 2038 10/08/15 0535 10/10/15 0537 10/11/15 0539   NA 138 135 137 137 138  K 4.0 3.3* 3.7 5.4* 5.9*  CL 98* 96* 101 102 101  CO2 _1 17* 13*  GLUCOSE 134* 145* 135* 116* 41*  BUN 25* 22* 24* 33* 45*  CREATININE 1.50* 1.27* 1.30* 2.78* 4.50*  CALCIUM 9.2 8.2* 8.5* 8.6* 8.0*  MG  --  2.0  --   --   --   PHOS  --  2.9  --   --   --    Liver Function Tests:  Recent Labs Lab 10/11/2015 1132 09/29/2015 2038 10/08/15 0535  AST 24 16 14*  ALT 13* 10* 10*  ALKPHOS 99 84 76  BILITOT 0.4 0.6 0.4  PROT 7.1 6.0* 5.6*  ALBUMIN 3.5 3.0* 2.7*    Recent Labs Lab 10/19/2015 1132 10/08/15 0535  LIPASE 24  --   AMYLASE  --  58   No results for input(s): AMMONIA in the last 168 hours. CBC:  Recent Labs Lab 09/21/2015 1132 10/13/2015 2038 10/08/15 0535 10/10/15 0537 10/11/15 0539  WBC 19.7* 18.9* 22.4* 17.8* 21.0*  NEUTROABS 17.5* 16.2*  --   --   --   HGB 12.3* 11.4* 10.1* 11.3* 11.3*  HCT 38.3* 35.3* 32.1* 37.1* 35.6*  MCV 77.4* 77.4* 78.9 79.3 78.4  PLT 345 332 296 136* 106*   Cardiac Enzymes:  No results for input(s): CKTOTAL, CKMB, CKMBINDEX, TROPONINI in the last 168 hours. BNP: Invalid input(s): POCBNP CBG:  Recent Labs Lab 10/08/15 0733 10/09/15 0821 10/10/15 0444 10/10/15 0938 10/11/15 0742  GLUCAP 119* 71 115* 118* 108*    Recent Results (from the past 240 hour(s))  Culture, blood (x 2)     Status: None (Preliminary result)   Collection Time: 10/20/2015  1:20 PM  Result Value Ref Range Status   Specimen Description BLOOD RIGHT ANTECUBITAL  Final   Special Requests BOTTLES DRAWN AEROBIC AND ANAEROBIC 5CC EACH  Final   Culture   Final    NO GROWTH 3 DAYS Performed at [facility]    Report Status PENDING  Incomplete  Culture, blood (x 2)     Status: None (Preliminary result)   Collection Time: 10/15/2015  2:00 PM  Result Value Ref Range Status   Specimen Description BLOOD LEFT ANTECUBITAL  Final   Special Requests BOTTLES DRAWN AEROBIC AND ANAEROBIC 5CC EACH  Final   Culture   Final    NO GROWTH 3  DAYS Performed at [facility]    Report Status PENDING  Incomplete     Scheduled Meds: . cefTAZidime (FORTAZ)  IV  1 g Intravenous Q24H  . metronidazole  500 mg Intravenous 3 times per day  . pantoprazole (PROTONIX) IV  40 mg Intravenous Q12H  . senna-docusate  1 tablet Oral BID  . sodium chloride  3 mL Intravenous Q12H   Continuous Infusions: . sodium chloride 30 mL/hr at 10/09/15 0051

## 2015-10-12 ENCOUNTER — Other Ambulatory Visit: Payer: Self-pay | Admitting: *Deleted

## 2015-10-12 LAB — CULTURE, BLOOD (ROUTINE X 2)
CULTURE: NO GROWTH
Culture: NO GROWTH

## 2015-10-21 NOTE — Patient Outreach (Signed)
According to chart, member expired early this morning.  Will notify care management assistant of case closure.  Kemper Durie, BSN, [facility] Care Management  [facility] Care Manager [phone]

## 2015-10-21 NOTE — Progress Notes (Signed)
This shift at 0245 this RN went into pt room to find  pt Expired. This confirmed by Consulting civil engineer, Cindra Eves. Attending notified, Benedetto Coons. Was able to contact niece, G. Kennith Center to notify. [facility] called.

## 2015-10-21 DEATH — deceased

## 2015-11-18 NOTE — Discharge Summary (Signed)
Death Summary  Luis Norton IAX:[id number] DOB: [birth year] DOA: 10/19/2015  PCP: Kandice Hams, MD PCP/Office notified  Admit date: 2015/10/19 Date of Death: 11-07-15  Final Diagnoses:  Principal Problem:   Generalized abdominal pain Active Problems:   Colitis   UTI (urinary tract infection)   Leukocytosis   Essential hypertension   Paroxysmal atrial fibrillation (HCC)   Acute renal failure superimposed on stage 3 chronic kidney disease (HCC)   At risk for aspiration   Anemia of chronic disease   Hypokalemia   Palliative care encounter   DNR (do not resuscitate)   History of present illness:    Hospital Course: 82 year old male with history of a fib (not on AC due to risk of GI bleed), hypertension, recent hospitalization for colitis (admission from 1/10 through 1/14). He presented back to [facility] ED with worsening abdominal pain since this recent hospitalization. Pt reported pain to be diffuse, intermittent, 7-8/10 in intensity somewhat better with analgesia given in ED. No fevers. He was still on cipro and flagyl from last admission for colitis. No diarrhea. No blood in stool.   In ED, BP was 129/75, HR 116, RR 24, T max 98.4 F and oxygen saturation was 91-100%. Blood work showed leukocytosis of 19.7, creatinine 1.5, lactic acid was 2.74, mild elevation in procalcitonin level at 0.12. CT abdomen showed interval development of moderate gastric distention as well as mild inflammatory changes in the region of the pancreatic head and proximal duodenum, uncertain if this represents pancreatitis or peptic ulcer disease. He was palced on empiric antibioitcs, ceftaz and flagyl. Blood cultures were obtained, recent C.diff 09/30/2015 was negative.  Barrier to discharge was : Appreciate palliative care assistance in addressing the goals of care. Appreciate help in addressing the following issues: Further need for antibiotics, continuing to monitor blood work and transition to full comfort care.  Patient continues to be lethargic, has poor by mouth intake, his leukocytosis is worse, his renal function is worsening with other electrolyte abnormalities. Transition to full comfort care may be reasonable at this point but will see with palliative care and their thoughts.  Assessment/Plan:    Principal Problem: Abdominal pain / Sepsis due to possible colitis / Gastritis or PUD / UTI / Leukocytosis  - Sepsis criteria met on the admission with tachycardia, tachypnea, leukocytosis and lactic acidosis  - UTI possible source of infection versus aspiration pneumonitis versus colitis - CT abdomen on admission demonstrated gastric distention as well as mild inflammatory changes in the region of the pancreatic head and proximal duodenum.  - Blood cultures negative so far - Was on abx as indicated in anti-infective section  Active Problems: Aspiration risk / severe protein calorie malnutrition - High risk of aspiration - Appreciate palliative care addressing goals of care - Family prefers comfort feedings  Acute renal failure superimposed on CKD stage 3 - Recent Cr 1.5 and on this admission Cr was at baseline values - Worsening renal function. Appreciated palliative care continuing to address goals of care.  Hypokalemia / hyperkalemia - Initially hypokalemic likely because of GI losses.  - Potassium then supplemented and then hyperkalemic at 5.4 and repeat level 5.9 - Give calcium gluconate and low-dose Lasix   Anemia of chronic disease - Secondary to anemia of chronic kidney disease - Hemoglobin stable  History of paroxysmal atrial fibrillation - Chads vasc score3. - Not on AC due to risk of GI bleed  Essential hypertension - Blood pressure controlled  History of peptic ulcer disease - Was  on Protonix 40 mg IV every 12 hours  DVT prophylaxis:  - SCD's bilaterally in hospital   Code Status: DNR/DNI   IV access:  Peripheral IV  Procedures and diagnostic studies:    Ct Abdomen Pelvis Wo Contrast 09/23/2015 Atherosclerosis of abdominal aorta without aneurysm formation. Mild bilateral pleural effusions with adjacent subsegmental atelectasis. Stable moderate prostatic enlargement. Interval development of moderate gastric distention as well as mild inflammatory changes in the region of the pancreatic head and proximal duodenum. It is uncertain if this represents pancreatitis or peptic ulcer disease. Mildly enlarged lymph nodes are now noted inferior pancreatic head and posterior to distal stomach, which most likely are inflammatory in etiology. Electronically Signed By: Marijo Conception, M.D. On: 09/21/2015 13:10   Medical Consultants:  PCT  Other Consultants:  PT SLP Nutrition  IAnti-Infectives:   Ceftazidime 10/04/2015 --> 1/23 Flagyl 10/16/2015 --> 1/23  Time: 3:45 am Oct 26, 2015  Signed:  Leisa Lenz  [facility] 10/26/2015, 7:23 PM

## 2016-11-10 IMAGING — DX DG ABDOMEN 2V
3 series · 3 of 3 positions shown · non-contrast
Comparison: CT 05/16/2015

CLINICAL DATA: Inguinal hernia, mid abdominal pain.

EXAM:
ABDOMEN - 2 VIEW

[abdomen supine]
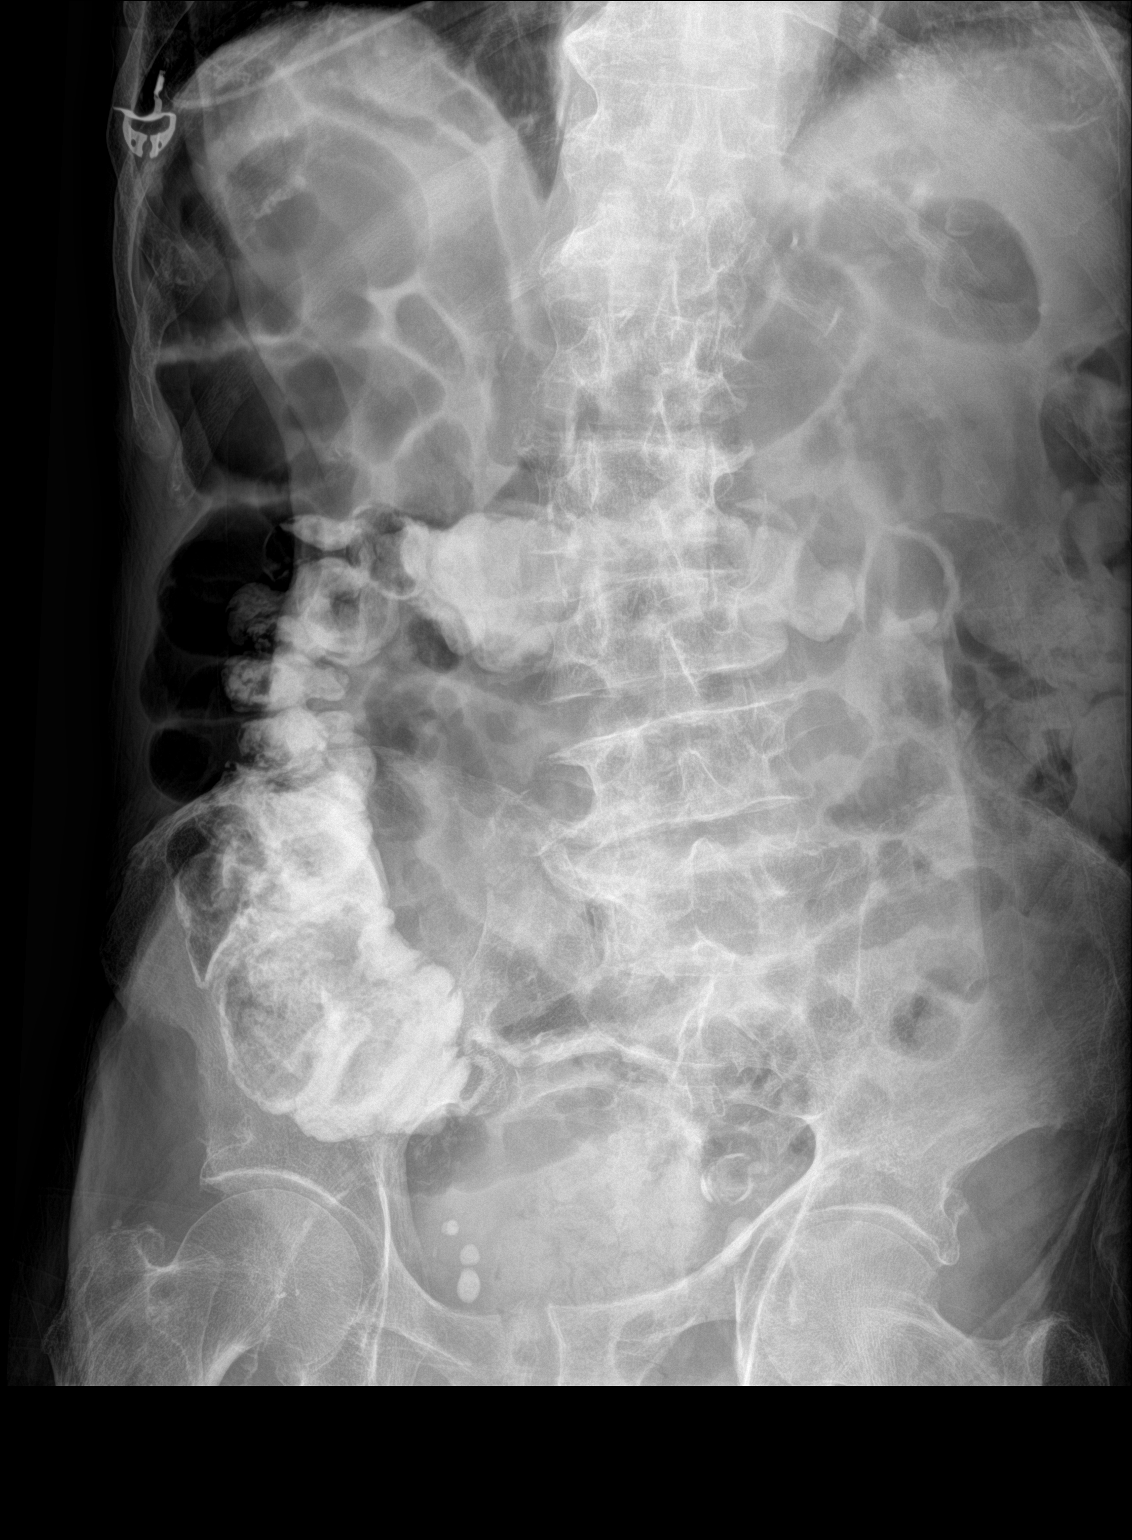

[abdomen decu (1 of 2)]
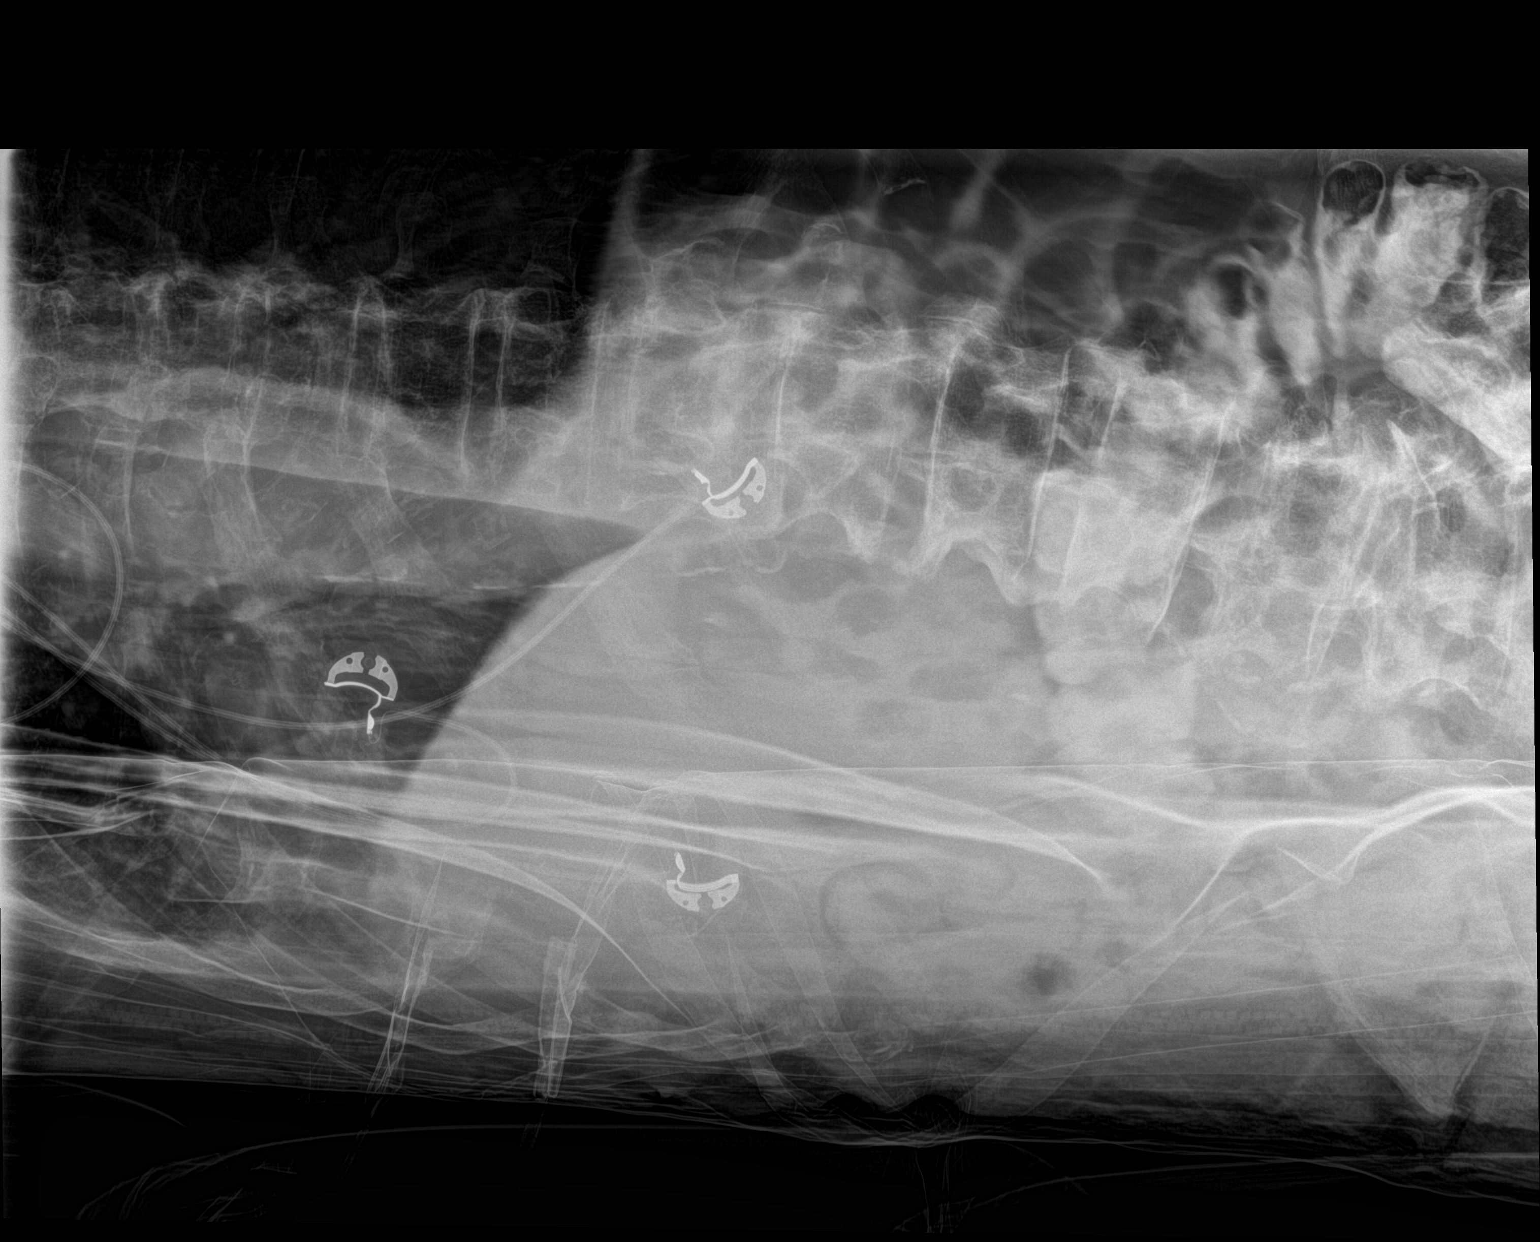

[abdomen decu (2 of 2)]
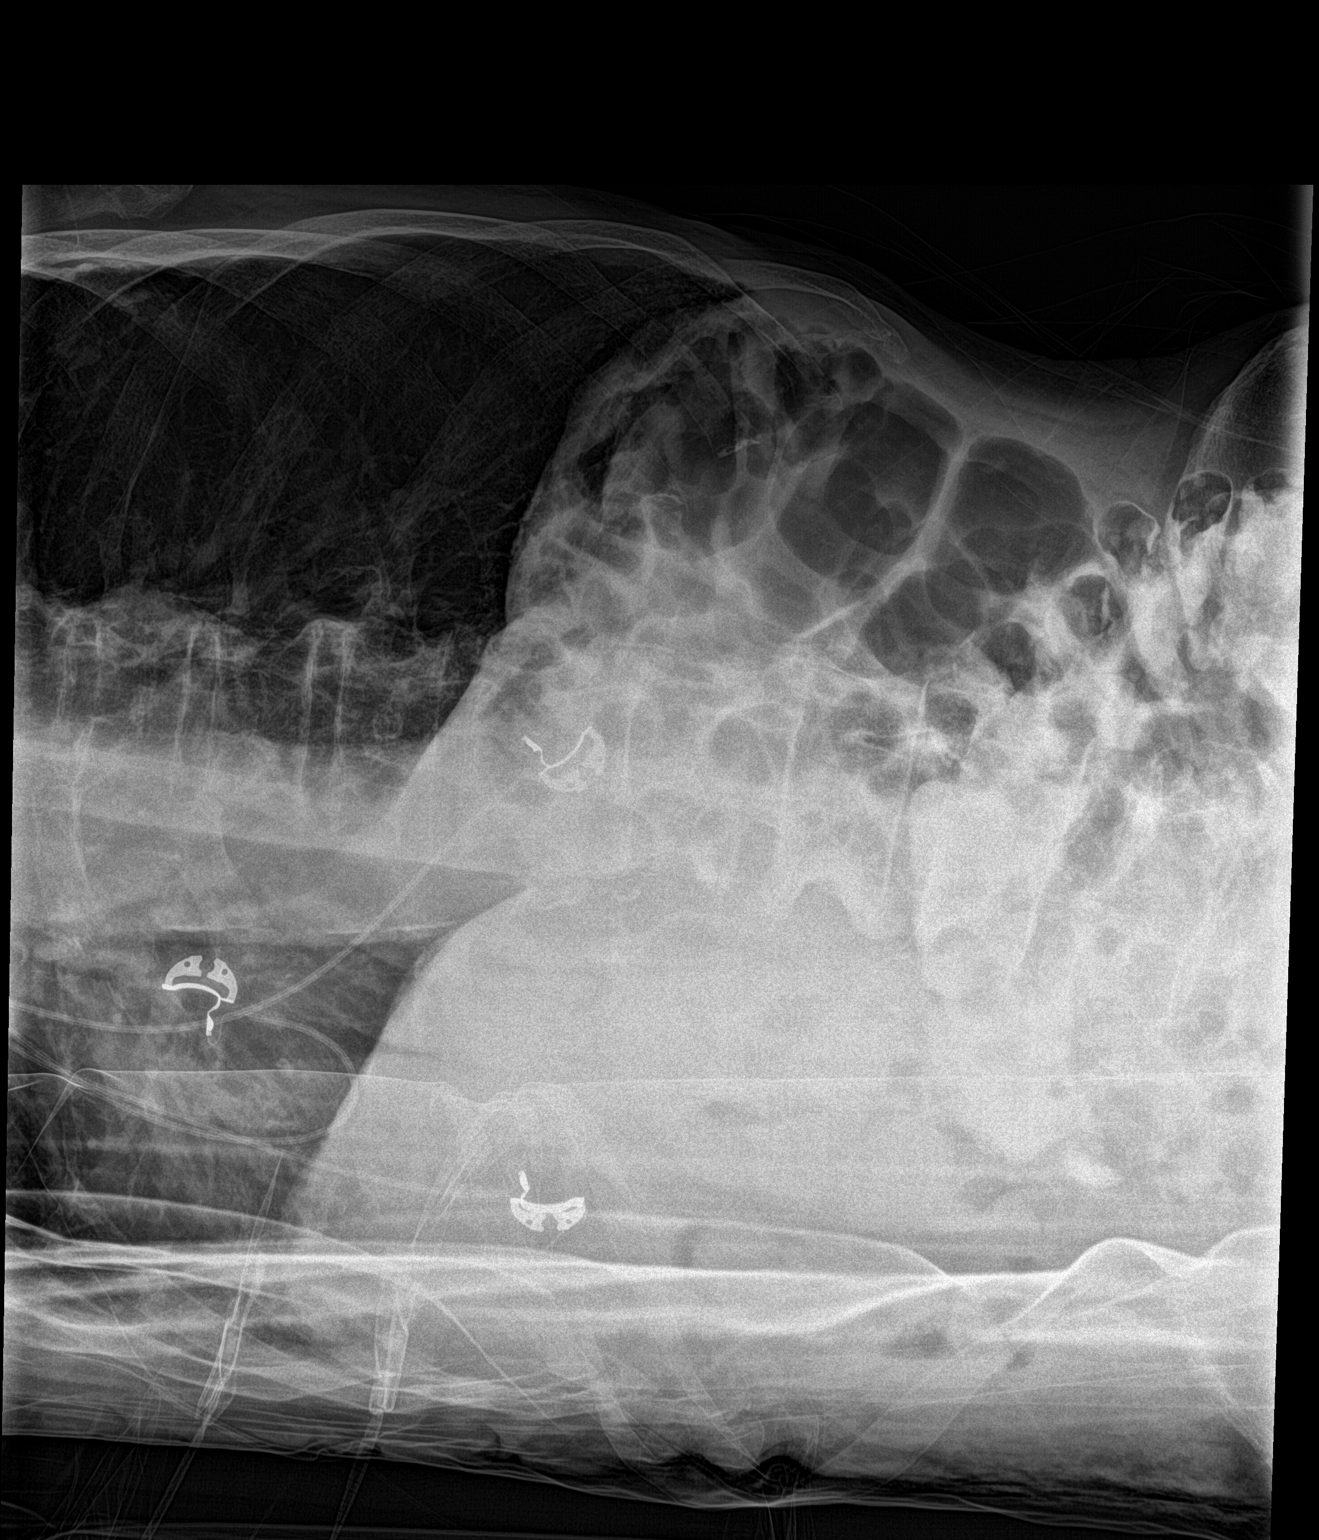

[3 of 3 positions shown; findings below may reference images not displayed]

FINDINGS: Oral contrast material seen within the colon. Mild diffuse gaseous
distention of bowel. No evidence of bowel obstruction. No free air.
No organomegaly.
IMPRESSION: Mild diffuse gaseous distention of bowel, likely mild ileus. No
evidence of bowel obstruction or free air.

## 2017-03-25 IMAGING — CT CT ABD-PELV W/ CM
2 of 5 series · 16 of 46 positions shown, 18 images · IV contrast (omnipaque)
Comparison: CT 05/24/2015

CLINICAL DATA: [AGE] male with umbilical pain and nausea,
vomiting and diarrhea, onset yesterday.

EXAM:
CT ABDOMEN AND PELVIS WITH CONTRAST
TECHNIQUE: Multidetector CT imaging of the abdomen and pelvis was performed
using the standard protocol following bolus administration of
intravenous contrast.
CONTRAST:  75mL OMNIPAQUE IOHEXOL 300 MG/ML  SOLN

[Series 2: a/p w/ 5mm · axial · 0.81mm/px · z∈[+963,+1348]mm · 13 of 89 slices shown, 15 images]
[im 6/89  soft-tissue]
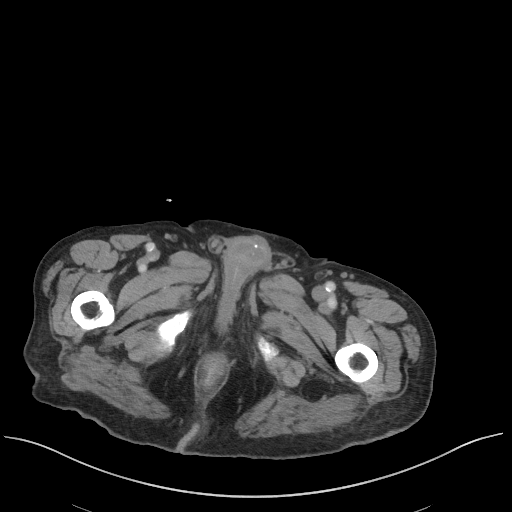
[im 6/89  bone]
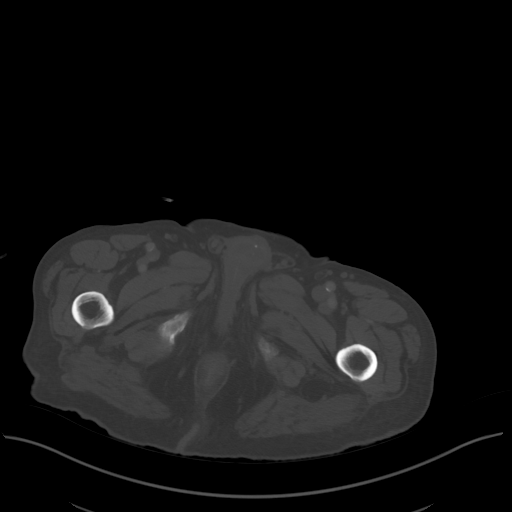
[im 11/89  soft-tissue]
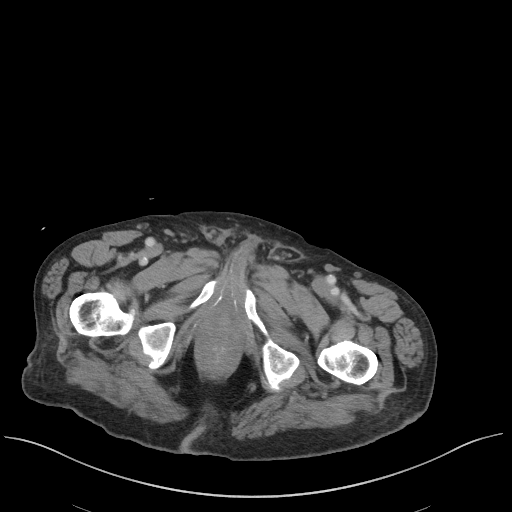
[im 21/89  soft-tissue]
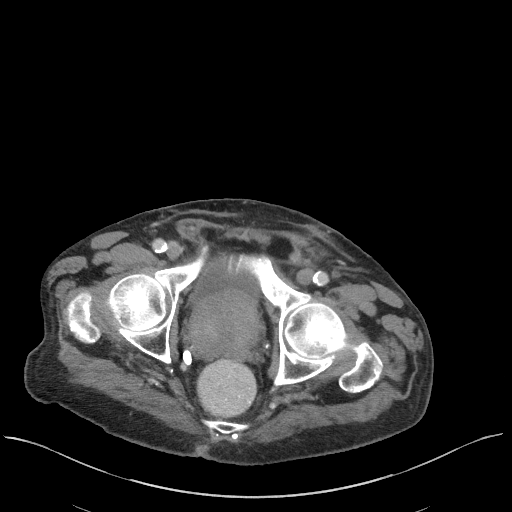
[im 26/89  soft-tissue]
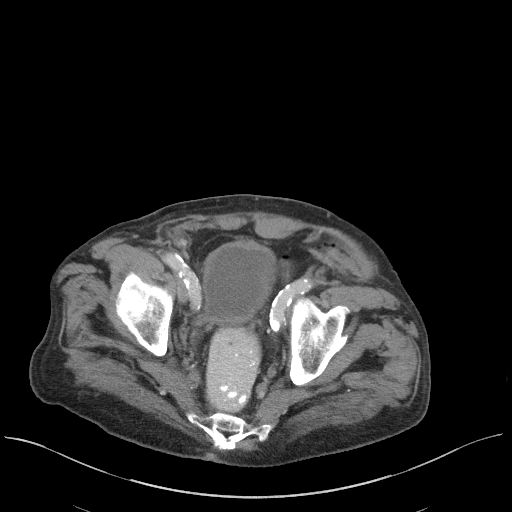
[im 32/89  soft-tissue]
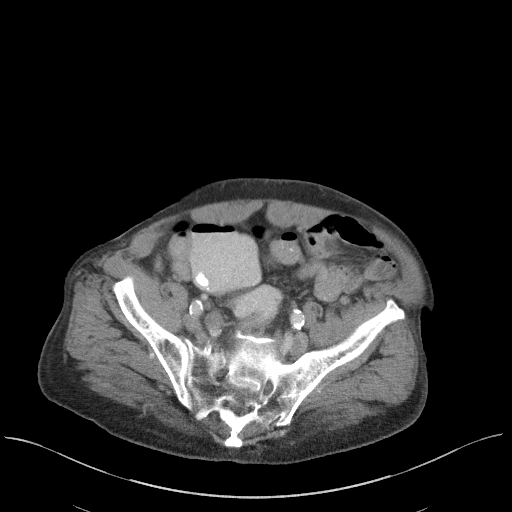
[im 37/89  soft-tissue]
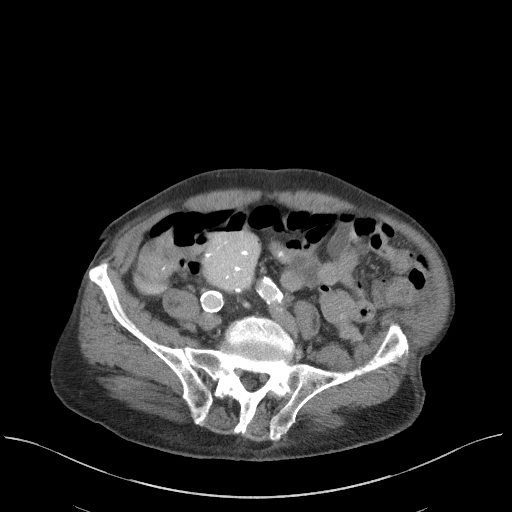
[im 47/89  soft-tissue]
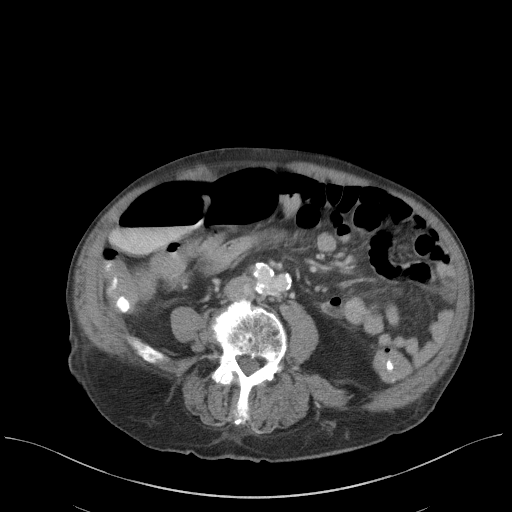
[im 52/89  soft-tissue]
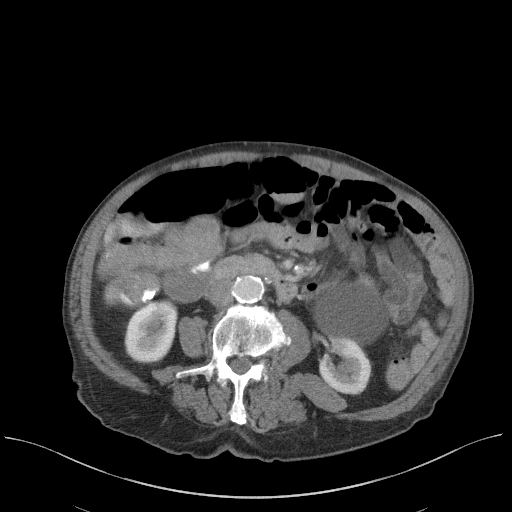
[im 57/89  soft-tissue]
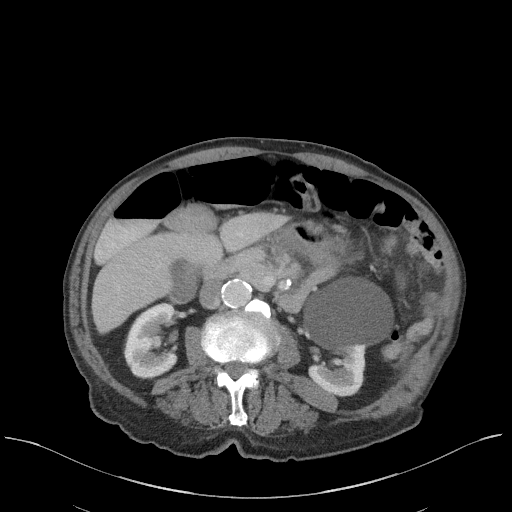
[im 57/89  bone]
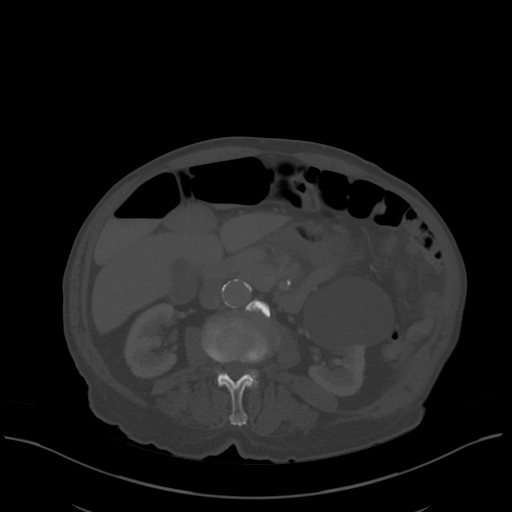
[im 63/89  soft-tissue]
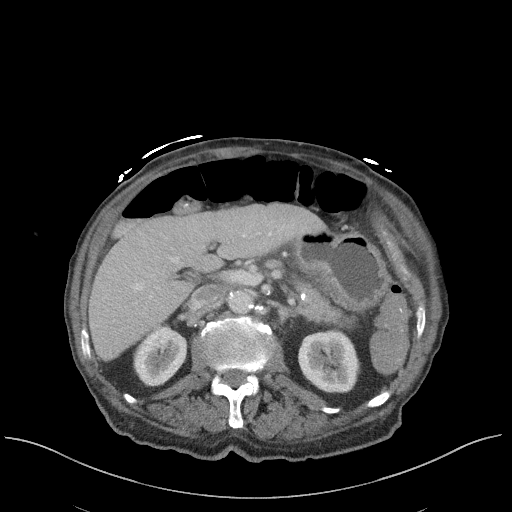
[im 68/89  soft-tissue]
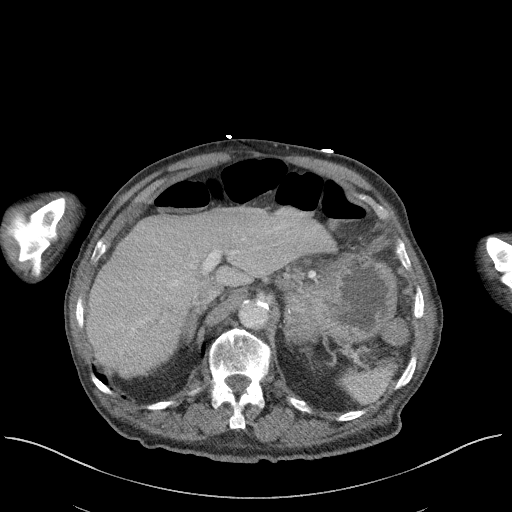
[im 78/89  soft-tissue]
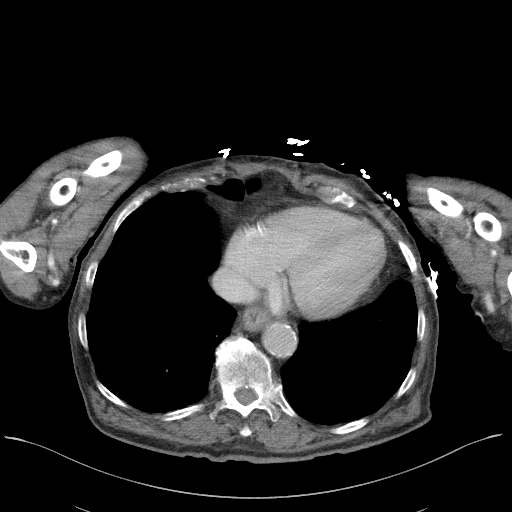
[im 83/89  soft-tissue]
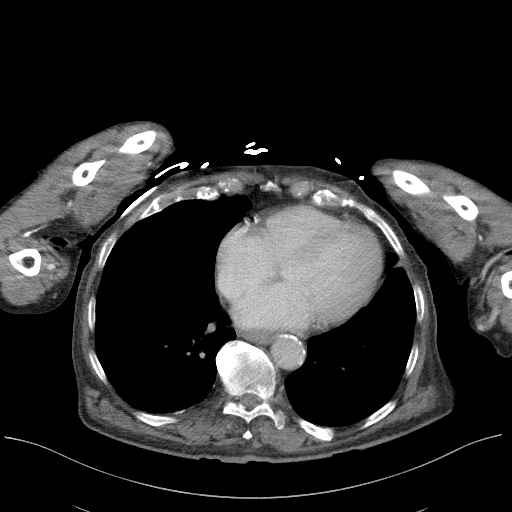

[Series 5: a/p w/ cor · coronal · 0.77mm/px · 3 of 151 slices shown]
[im 51/151  soft-tissue]
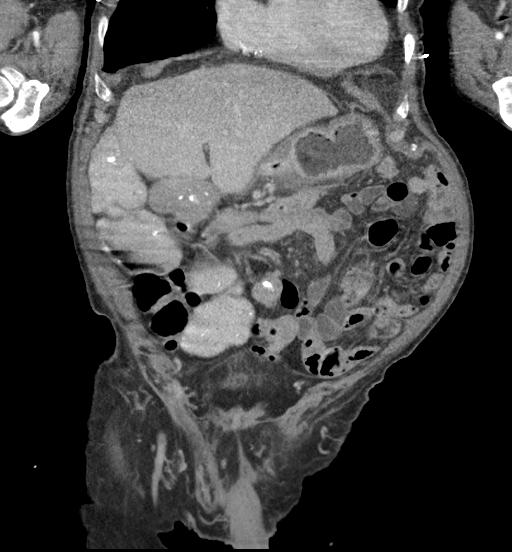
[im 67/151  soft-tissue]
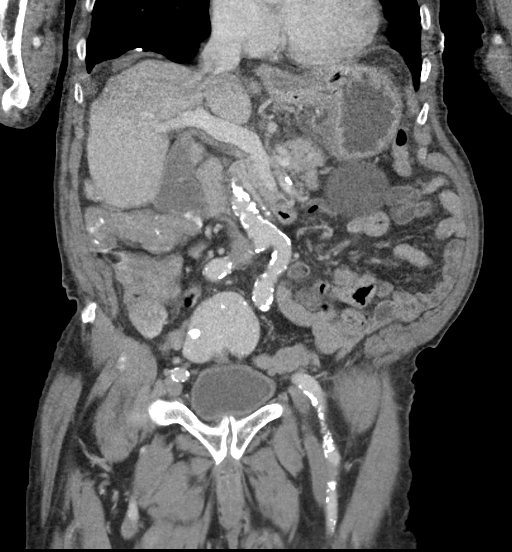
[im 84/151  soft-tissue]
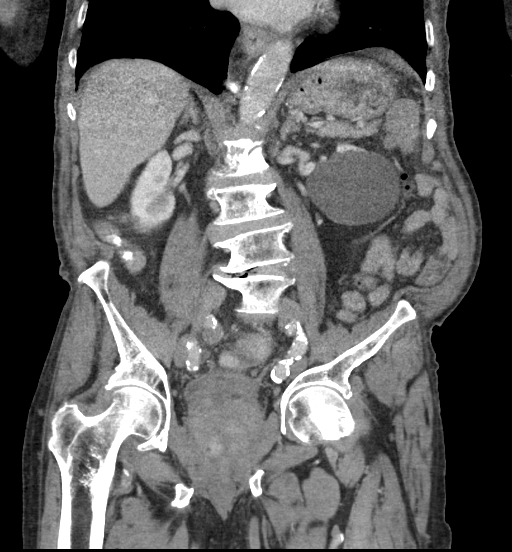

[16 of 46 positions shown; findings below may reference images not displayed]

FINDINGS: Lower chest: No consolidation. Cardiomegaly is unchanged. Coronary
artery calcifications are seen. Calcified granuloma at the right
lung base.

Liver: No focal lesion

Hepatobiliary: Gallbladder physiologically distended, no calcified
stone. No biliary dilatation.

Pancreas: No ductal dilatation or inflammation.

Spleen: Normal.

Adrenal glands: No nodule.

Kidneys: Symmetric renal enhancement. No hydronephrosis. Large left
renal cyst measuring at least 7.8 cm is again seen. Questionable
punctate nonobstructing left nephrolithiasis.

Stomach/Bowel: Stomach physiologically distended with ingested
contents. Lack of enteric contrast limits bowel evaluation.
Fluid-filled normal caliber small bowel throughout the abdomen.
Liquid stool suspected in the colon with colonic wall thickening
involving the ascending and descending colon. Mild gaseous
distention of transverse colon which appears tortuous. Appendix
tentatively identified and normal.

Vascular/Lymphatic: No retroperitoneal adenopathy. Abdominal aorta
is normal in caliber. Atherosclerosis and tortuosity of the
abdominal aorta. Stable aneurysmal dilatation of the common iliac
arteries, 1.6 cm on the right, 1.5 cm on the left.

Reproductive: Heterogeneous enlarged prostate gland causing mass
effect on the bladder base.

Bladder: Physiologically distended, mild chronic wall thickening.

Other: No free air, free fluid, or intra-abdominal fluid collection.
Resolution of previous inguinal edema post hernia repair. No
recurrent hernia.

Musculoskeletal: There are no acute or suspicious osseous
abnormalities. Scoliosis and degenerative change throughout spine.
IMPRESSION: 1. Bowel wall thickening of the ascending and descending colon, with
liquid stool suspected, most consistent with diarrheal illness, mild
colitis. No small bowel dilatation.
2. Stable chronic findings as described.
